# Patient Record
Sex: Female | Born: 1965 | Race: White | Hispanic: No | Marital: Married | Smoking: Never smoker
Health system: Southern US, Community
[De-identification: ages and names within clinical notes are randomized; demographics above are authoritative.]

## PROBLEM LIST (undated history)

## (undated) DIAGNOSIS — K5900 Constipation, unspecified: Secondary | ICD-10-CM

## (undated) DIAGNOSIS — M2550 Pain in unspecified joint: Secondary | ICD-10-CM

## (undated) DIAGNOSIS — M7989 Other specified soft tissue disorders: Secondary | ICD-10-CM

## (undated) DIAGNOSIS — D649 Anemia, unspecified: Secondary | ICD-10-CM

## (undated) DIAGNOSIS — M549 Dorsalgia, unspecified: Secondary | ICD-10-CM

## (undated) DIAGNOSIS — K219 Gastro-esophageal reflux disease without esophagitis: Secondary | ICD-10-CM

## (undated) DIAGNOSIS — R600 Localized edema: Secondary | ICD-10-CM

## (undated) DIAGNOSIS — E669 Obesity, unspecified: Secondary | ICD-10-CM

## (undated) DIAGNOSIS — E559 Vitamin D deficiency, unspecified: Secondary | ICD-10-CM

## (undated) DIAGNOSIS — R0602 Shortness of breath: Secondary | ICD-10-CM

## (undated) HISTORY — DX: Pain in unspecified joint: M25.50

## (undated) HISTORY — DX: Vitamin D deficiency, unspecified: E55.9

## (undated) HISTORY — DX: Obesity, unspecified: E66.9

## (undated) HISTORY — DX: Constipation, unspecified: K59.00

## (undated) HISTORY — DX: Localized edema: R60.0

## (undated) HISTORY — DX: Other specified soft tissue disorders: M79.89

## (undated) HISTORY — DX: Gastro-esophageal reflux disease without esophagitis: K21.9

## (undated) HISTORY — DX: Shortness of breath: R06.02

## (undated) HISTORY — DX: Anemia, unspecified: D64.9

## (undated) HISTORY — PX: TUBAL LIGATION: SHX77

## (undated) HISTORY — DX: Dorsalgia, unspecified: M54.9

---

## 1987-07-20 HISTORY — PX: TUBAL LIGATION: SHX77

## 1997-07-19 HISTORY — PX: REDUCTION MAMMAPLASTY: SUR839

## 1999-02-17 HISTORY — PX: BREAST SURGERY: SHX581

## 2005-04-09 ENCOUNTER — Ambulatory Visit: Payer: Self-pay | Admitting: Family Medicine

## 2007-05-26 ENCOUNTER — Ambulatory Visit: Payer: Self-pay | Admitting: Family Medicine

## 2008-01-08 ENCOUNTER — Ambulatory Visit: Payer: Self-pay | Admitting: Family Medicine

## 2009-01-21 ENCOUNTER — Ambulatory Visit: Payer: Self-pay | Admitting: Family Medicine

## 2009-05-29 ENCOUNTER — Ambulatory Visit: Payer: Self-pay | Admitting: Family Medicine

## 2009-05-29 LAB — HM MAMMOGRAPHY

## 2010-05-18 HISTORY — PX: OTHER SURGICAL HISTORY: SHX169

## 2010-06-29 ENCOUNTER — Encounter (INDEPENDENT_AMBULATORY_CARE_PROVIDER_SITE_OTHER): Payer: Self-pay | Admitting: *Deleted

## 2010-07-02 ENCOUNTER — Ambulatory Visit: Payer: Self-pay | Admitting: Otolaryngology

## 2010-08-07 ENCOUNTER — Ambulatory Visit: Admit: 2010-08-07 | Payer: Self-pay | Admitting: Gastroenterology

## 2010-08-20 NOTE — Letter (Signed)
Summary: New Patient letter  [facility]  [address] [place], [place] [address]   Phone: [phone]  Fax: [phone]       06/29/2010 MRN: [id number]  [facility] [address] [place], [place]  [address]  Dear Andrea Alvarez,  Welcome to the Gastroenterology Division at [facility]    You are scheduled to see Dr.  Christella Hartigan on 08-07-10 at 2:00p.m. on the 3rd floor at [facility], [address]  We ask that you try to arrive at our office 15 minutes prior to your appointment time to allow for check-in.  We would like you to complete the enclosed self-administered evaluation form prior to your visit and bring it with you on the day of your appointment.  We will review it with you.  Also, please bring a complete list of all your medications or, if you prefer, bring the medication bottles and we will list them.  Please bring your insurance card so that we may make a copy of it.  If your insurance requires a referral to see a specialist, please bring your referral form from your primary care physician.  Co-payments are due at the time of your visit and may be paid by cash, check or credit card.     Your office visit will consist of a consult with your physician (includes a physical exam), any laboratory testing he/she may order, scheduling of any necessary diagnostic testing (e.g. x-ray, ultrasound, CT-scan), and scheduling of a procedure (e.g. Endoscopy, Colonoscopy) if required.  Please allow enough time on your schedule to allow for any/all of these possibilities.    If you cannot keep your appointment, please call [phone] to cancel or reschedule prior to your appointment date.  This allows [place] the opportunity to schedule an appointment for another patient in need of care.  If you do not cancel or reschedule by 5 p.m. the business day prior to your appointment date, you will be charged a $50.00 late cancellation/no-show fee.    Thank you for choosing  [facility] for your medical needs.  We appreciate the opportunity to care for you.  Please visit [place] at our website  to learn more about our practice.                     Sincerely,                                                             The Gastroenterology Division

## 2011-07-06 ENCOUNTER — Ambulatory Visit: Payer: Self-pay | Admitting: Family Medicine

## 2012-01-17 ENCOUNTER — Emergency Department: Payer: Self-pay | Admitting: Emergency Medicine

## 2012-11-10 ENCOUNTER — Ambulatory Visit: Payer: Self-pay | Admitting: Family Medicine

## 2014-01-29 ENCOUNTER — Ambulatory Visit: Payer: Self-pay | Admitting: Family Medicine

## 2014-05-01 LAB — CBC AND DIFFERENTIAL
HEMATOCRIT: 39 % (ref 36–46)
HEMOGLOBIN: 12.9 g/dL (ref 12.0–16.0)
Platelets: 404 10*3/uL — AB (ref 150–399)
WBC: 7.8 10^3/mL

## 2014-05-17 LAB — HM PAP SMEAR: HM PAP: NORMAL

## 2015-05-13 DIAGNOSIS — D649 Anemia, unspecified: Secondary | ICD-10-CM | POA: Insufficient documentation

## 2015-05-13 DIAGNOSIS — K219 Gastro-esophageal reflux disease without esophagitis: Secondary | ICD-10-CM | POA: Insufficient documentation

## 2015-05-13 DIAGNOSIS — M545 Low back pain, unspecified: Secondary | ICD-10-CM | POA: Insufficient documentation

## 2015-05-13 DIAGNOSIS — E669 Obesity, unspecified: Secondary | ICD-10-CM

## 2015-05-13 DIAGNOSIS — Z6837 Body mass index (BMI) 37.0-37.9, adult: Secondary | ICD-10-CM | POA: Insufficient documentation

## 2015-05-13 DIAGNOSIS — E66812 Obesity, class 2: Secondary | ICD-10-CM | POA: Insufficient documentation

## 2015-05-13 DIAGNOSIS — R195 Other fecal abnormalities: Secondary | ICD-10-CM | POA: Insufficient documentation

## 2015-05-13 DIAGNOSIS — G43909 Migraine, unspecified, not intractable, without status migrainosus: Secondary | ICD-10-CM | POA: Insufficient documentation

## 2015-05-13 DIAGNOSIS — R61 Generalized hyperhidrosis: Secondary | ICD-10-CM | POA: Insufficient documentation

## 2015-05-13 DIAGNOSIS — R1011 Right upper quadrant pain: Secondary | ICD-10-CM | POA: Insufficient documentation

## 2015-05-16 ENCOUNTER — Ambulatory Visit (INDEPENDENT_AMBULATORY_CARE_PROVIDER_SITE_OTHER): Payer: 59 | Admitting: Physician Assistant

## 2015-05-16 ENCOUNTER — Encounter: Payer: Self-pay | Admitting: Physician Assistant

## 2015-05-16 VITALS — BP 120/78 | HR 84 | Temp 98.6°F | Resp 16 | Ht 62.0 in | Wt 202.4 lb

## 2015-05-16 DIAGNOSIS — E669 Obesity, unspecified: Secondary | ICD-10-CM

## 2015-05-16 DIAGNOSIS — Z23 Encounter for immunization: Secondary | ICD-10-CM

## 2015-05-16 DIAGNOSIS — Z6837 Body mass index (BMI) 37.0-37.9, adult: Secondary | ICD-10-CM

## 2015-05-16 DIAGNOSIS — Z0000 Encounter for general adult medical examination without abnormal findings: Secondary | ICD-10-CM | POA: Diagnosis not present

## 2015-05-16 DIAGNOSIS — Z1239 Encounter for other screening for malignant neoplasm of breast: Secondary | ICD-10-CM | POA: Diagnosis not present

## 2015-05-16 DIAGNOSIS — Z713 Dietary counseling and surveillance: Secondary | ICD-10-CM | POA: Diagnosis not present

## 2015-05-16 DIAGNOSIS — N951 Menopausal and female climacteric states: Secondary | ICD-10-CM

## 2015-05-16 DIAGNOSIS — R232 Flushing: Secondary | ICD-10-CM

## 2015-05-16 MED ORDER — PHENTERMINE HCL 37.5 MG PO CAPS: 37.5000 mg | ORAL_CAPSULE | ORAL | Status: DC

## 2015-05-16 NOTE — Patient Instructions (Addendum)
Health Maintenance, Female Adopting a healthy lifestyle and getting preventive care can go a long way to promote health and wellness. Talk with your health care provider about what schedule of regular examinations is right for you. This is a good chance for you to check in with your provider about disease prevention and staying healthy. In between checkups, there are plenty of things you can do on your own. Experts have done a lot of research about which lifestyle changes and preventive measures are most likely to keep you healthy. Ask your health care provider for more information. WEIGHT AND DIET  Eat a healthy diet  Be sure to include plenty of vegetables, fruits, low-fat dairy products, and lean protein.  Do not eat a lot of foods high in solid fats, added sugars, or salt.  Get regular exercise. This is one of the most important things you can do for your health.  Most adults should exercise for at least 150 minutes each week. The exercise should increase your heart rate and make you sweat (moderate-intensity exercise).  Most adults should also do strengthening exercises at least twice a week. This is in addition to the moderate-intensity exercise.  Maintain a healthy weight  Body mass index (BMI) is a measurement that can be used to identify possible weight problems. It estimates body fat based on height and weight. Your health care provider can help determine your BMI and help you achieve or maintain a healthy weight.  For females 28 years of age and older:   A BMI below 18.5 is considered underweight.  A BMI of 18.5 to 24.9 is normal.  A BMI of 25 to 29.9 is considered overweight.  A BMI of 30 and above is considered obese.  Watch levels of cholesterol and blood lipids  You should start having your blood tested for lipids and cholesterol at 49 years of age, then have this test every 5 years.  You may need to have your cholesterol levels checked more often if:  Your lipid  or cholesterol levels are high.  You are older than 49 years of age.  You are at high risk for heart disease.  CANCER SCREENING   Lung Cancer  Lung cancer screening is recommended for adults 75-66 years old who are at high risk for lung cancer because of a history of smoking.  A yearly low-dose CT scan of the lungs is recommended for people who:  Currently smoke.  Have quit within the past 15 years.  Have at least a 30-pack-year history of smoking. A pack year is smoking an average of one pack of cigarettes a day for 1 year.  Yearly screening should continue until it has been 15 years since you quit.  Yearly screening should stop if you develop a health problem that would prevent you from having lung cancer treatment.  Breast Cancer  Practice breast self-awareness. This means understanding how your breasts normally appear and feel.  It also means doing regular breast self-exams. Let your health care provider know about any changes, no matter how small.  If you are in your 20s or 30s, you should have a clinical breast exam (CBE) by a health care provider every 1-3 years as part of a regular health exam.  If you are 25 or older, have a CBE every year. Also consider having a breast X-ray (mammogram) every year.  If you have a family history of breast cancer, talk to your health care provider about genetic screening.  If you  are at high risk for breast cancer, talk to your health care provider about having an MRI and a mammogram every year.  Breast cancer gene (BRCA) assessment is recommended for women who have family members with BRCA-related cancers. BRCA-related cancers include:  Breast.  Ovarian.  Tubal.  Peritoneal cancers.  Results of the assessment will determine the need for genetic counseling and BRCA1 and BRCA2 testing. Cervical Cancer Your health care provider may recommend that you be screened regularly for cancer of the pelvic organs (ovaries, uterus, and  vagina). This screening involves a pelvic examination, including checking for microscopic changes to the surface of your cervix (Pap test). You may be encouraged to have this screening done every 3 years, beginning at age 21.  For women ages 30-65, health care providers may recommend pelvic exams and Pap testing every 3 years, or they may recommend the Pap and pelvic exam, combined with testing for human papilloma virus (HPV), every 5 years. Some types of HPV increase your risk of cervical cancer. Testing for HPV may also be done on women of any age with unclear Pap test results.  Other health care providers may not recommend any screening for nonpregnant women who are considered low risk for pelvic cancer and who do not have symptoms. Ask your health care provider if a screening pelvic exam is right for you.  If you have had past treatment for cervical cancer or a condition that could lead to cancer, you need Pap tests and screening for cancer for at least 20 years after your treatment. If Pap tests have been discontinued, your risk factors (such as having a new sexual partner) need to be reassessed to determine if screening should resume. Some women have medical problems that increase the chance of getting cervical cancer. In these cases, your health care provider may recommend more frequent screening and Pap tests. Colorectal Cancer  This type of cancer can be detected and often prevented.  Routine colorectal cancer screening usually begins at 50 years of age and continues through 49 years of age.  Your health care provider may recommend screening at an earlier age if you have risk factors for colon cancer.  Your health care provider may also recommend using home test kits to check for hidden blood in the stool.  A small camera at the end of a tube can be used to examine your colon directly (sigmoidoscopy or colonoscopy). This is done to check for the earliest forms of colorectal  cancer.  Routine screening usually begins at age 50.  Direct examination of the colon should be repeated every 5-10 years through 49 years of age. However, you may need to be screened more often if early forms of precancerous polyps or small growths are found. Skin Cancer  Check your skin from head to toe regularly.  Tell your health care provider about any new moles or changes in moles, especially if there is a change in a mole's shape or color.  Also tell your health care provider if you have a mole that is larger than the size of a pencil eraser.  Always use sunscreen. Apply sunscreen liberally and repeatedly throughout the day.  Protect yourself by wearing long sleeves, pants, a wide-brimmed hat, and sunglasses whenever you are outside. HEART DISEASE, DIABETES, AND HIGH BLOOD PRESSURE   High blood pressure causes heart disease and increases the risk of stroke. High blood pressure is more likely to develop in:  People who have blood pressure in the high end   of the normal range (130-139/85-89 mm Hg).  People who are overweight or obese.  People who are African American.  If you are 38-23 years of age, have your blood pressure checked every 3-5 years. If you are 61 years of age or older, have your blood pressure checked every year. You should have your blood pressure measured twice--once when you are at a hospital or clinic, and once when you are not at a hospital or clinic. Record the average of the two measurements. To check your blood pressure when you are not at a hospital or clinic, you can use:  An automated blood pressure machine at a pharmacy.  A home blood pressure monitor.  If you are between 45 years and 39 years old, ask your health care provider if you should take aspirin to prevent strokes.  Have regular diabetes screenings. This involves taking a blood sample to check your fasting blood sugar level.  If you are at a normal weight and have a low risk for diabetes,  have this test once every three years after 49 years of age.  If you are overweight and have a high risk for diabetes, consider being tested at a younger age or more often. PREVENTING INFECTION  Hepatitis B  If you have a higher risk for hepatitis B, you should be screened for this virus. You are considered at high risk for hepatitis B if:  You were born in a country where hepatitis B is common. Ask your health care provider which countries are considered high risk.  Your parents were born in a high-risk country, and you have not been immunized against hepatitis B (hepatitis B vaccine).  You have HIV or AIDS.  You use needles to inject street drugs.  You live with someone who has hepatitis B.  You have had sex with someone who has hepatitis B.  You get hemodialysis treatment.  You take certain medicines for conditions, including cancer, organ transplantation, and autoimmune conditions. Hepatitis C  Blood testing is recommended for:  Everyone born from [birth year] through 1965.  Anyone with known risk factors for hepatitis C. Sexually transmitted infections (STIs)  You should be screened for sexually transmitted infections (STIs) including gonorrhea and chlamydia if:  You are sexually active and are younger than 49 years of age.  You are older than 49 years of age and your health care provider tells you that you are at risk for this type of infection.  Your sexual activity has changed since you were last screened and you are at an increased risk for chlamydia or gonorrhea. Ask your health care provider if you are at risk.  If you do not have HIV, but are at risk, it may be recommended that you take a prescription medicine daily to prevent HIV infection. This is called pre-exposure prophylaxis (PrEP). You are considered at risk if:  You are sexually active and do not regularly use condoms or know the HIV status of your partner(s).  You take drugs by injection.  You are sexually  active with a partner who has HIV. Talk with your health care provider about whether you are at high risk of being infected with HIV. If you choose to begin PrEP, you should first be tested for HIV. You should then be tested every 3 months for as long as you are taking PrEP.  PREGNANCY   If you are premenopausal and you may become pregnant, ask your health care provider about preconception counseling.  If you may  become pregnant, take 400 to 800 micrograms (mcg) of folic acid every day.  If you want to prevent pregnancy, talk to your health care provider about birth control (contraception). OSTEOPOROSIS AND MENOPAUSE   Osteoporosis is a disease in which the bones lose minerals and strength with aging. This can result in serious bone fractures. Your risk for osteoporosis can be identified using a bone density scan.  If you are 69 years of age or older, or if you are at risk for osteoporosis and fractures, ask your health care provider if you should be screened.  Ask your health care provider whether you should take a calcium or vitamin D supplement to lower your risk for osteoporosis.  Menopause may have certain physical symptoms and risks.  Hormone replacement therapy may reduce some of these symptoms and risks. Talk to your health care provider about whether hormone replacement therapy is right for you.  HOME CARE INSTRUCTIONS   Schedule regular health, dental, and eye exams.  Stay current with your immunizations.   Do not use any tobacco products including cigarettes, chewing tobacco, or electronic cigarettes.  If you are pregnant, do not drink alcohol.  If you are breastfeeding, limit how much and how often you drink alcohol.  Limit alcohol intake to no more than 1 drink per day for nonpregnant women. One drink equals 12 ounces of beer, 5 ounces of wine, or 1 ounces of hard liquor.  Do not use street drugs.  Do not share needles.  Ask your health care provider for help if  you need support or information about quitting drugs.  Tell your health care provider if you often feel depressed.  Tell your health care provider if you have ever been abused or do not feel safe at home.   This information is not intended to replace advice given to you by your health care provider. Make sure you discuss any questions you have with your health care provider.   Document Released: 01/18/2011 Document Revised: 07/26/2014 Document Reviewed: 06/06/2013 Elsevier Interactive Patient Education 2016 [place] Calories are energy you get from the things you eat and drink. Your body uses this energy to keep you going throughout the day. The number of calories you eat affects your weight. When you eat more calories than your body needs, your body stores the extra calories as fat. When you eat fewer calories than your body needs, your body burns fat to get the energy it needs. Calorie counting means keeping track of how many calories you eat and drink each day. If you make sure to eat fewer calories than your body needs, you should lose weight. In order for calorie counting to work, you will need to eat the number of calories that are right for you in a day to lose a healthy amount of weight per week. A healthy amount of weight to lose per week is usually 1-2 lb (0.5-0.9 kg). A dietitian can determine how many calories you need in a day and give you suggestions on how to reach your calorie goal.  WHAT IS MY MY PLAN? My goal is to have 1200 calories per day.  If I have this many calories per day, I should lose around 1-2 pounds per week. WHAT DO I NEED TO KNOW ABOUT CALORIE COUNTING? In order to meet your daily calorie goal, you will need to:  Find out how many calories are in each food you would like to eat. Try to  do this before you eat.  Decide how much of the food you can eat.  Write down what you ate and how many calories it had. Doing this is  called keeping a food log. WHERE DO I FIND CALORIE INFORMATION? The number of calories in a food can be found on a Nutrition Facts label. Note that all the information on a label is based on a specific serving of the food. If a food does not have a Nutrition Facts label, try to look up the calories online or ask your dietitian for help. HOW DO I DECIDE HOW MUCH TO EAT? To decide how much of the food you can eat, you will need to consider both the number of calories in one serving and the size of one serving. This information can be found on the Nutrition Facts label. If a food does not have a Nutrition Facts label, look up the information online or ask your dietitian for help. Remember that calories are listed per serving. If you choose to have more than one serving of a food, you will have to multiply the calories per serving by the amount of servings you plan to eat. For example, the label on a package of bread might say that a serving size is 1 slice and that there are 90 calories in a serving. If you eat 1 slice, you will have eaten 90 calories. If you eat 2 slices, you will have eaten 180 calories. HOW DO I KEEP A FOOD LOG? After each meal, record the following information in your food log:  What you ate.  How much of it you ate.  How many calories it had.  Then, add up your calories. Keep your food log near you, such as in a small notebook in your pocket. Another option is to use a mobile app or website. Some programs will calculate calories for you and show you how many calories you have left each time you add an item to the log. WHAT ARE SOME CALORIE COUNTING TIPS?  Use your calories on foods and drinks that will fill you up and not leave you hungry. Some examples of this include foods like nuts and nut butters, vegetables, lean proteins, and high-fiber foods (more than 5 g fiber per serving).  Eat nutritious foods and avoid empty calories. Empty calories are calories you get from foods  or beverages that do not have many nutrients, such as candy and soda. It is better to have a nutritious high-calorie food (such as an avocado) than a food with few nutrients (such as a bag of chips).  Know how many calories are in the foods you eat most often. This way, you do not have to look up how many calories they have each time you eat them.  Look out for foods that may seem like low-calorie foods but are really high-calorie foods, such as baked goods, soda, and fat-free candy.  Pay attention to calories in drinks. Drinks such as sodas, specialty coffee drinks, alcohol, and juices have a lot of calories yet do not fill you up. Choose low-calorie drinks like water and diet drinks.  Focus your calorie counting efforts on higher calorie items. Logging the calories in a garden salad that contains only vegetables is less important than calculating the calories in a milk shake.  Find a way of tracking calories that works for you. Get creative. Most people who are successful find ways to keep track of how much they eat in a day,  even if they do not count every calorie. WHAT ARE SOME PORTION CONTROL TIPS?  Know how many calories are in a serving. This will help you know how many servings of a certain food you can have.  Use a measuring cup to measure serving sizes. This is helpful when you start out. With time, you will be able to estimate serving sizes for some foods.  Take some time to put servings of different foods on your favorite plates, bowls, and cups so you know what a serving looks like.  Try not to eat straight from a bag or box. Doing this can lead to overeating. Put the amount you would like to eat in a cup or on a plate to make sure you are eating the right portion.  Use smaller plates, glasses, and bowls to prevent overeating. This is a quick and easy way to practice portion control. If your plate is smaller, less food can fit on it.  Try not to multitask while eating, such as  watching TV or using your computer. If it is time to eat, sit down at a table and enjoy your food. Doing this will help you to start recognizing when you are full. It will also make you more aware of what and how much you are eating. HOW CAN I CALORIE COUNT WHEN EATING OUT?  Ask for smaller portion sizes or child-sized portions.  Consider sharing an entree and sides instead of getting your own entree.  If you get your own entree, eat only half. Ask for a box at the beginning of your meal and put the rest of your entree in it so you are not tempted to eat it.  Look for the calories on the menu. If calories are listed, choose the lower calorie options.  Choose dishes that include vegetables, fruits, whole grains, low-fat dairy products, and lean protein. Focusing on smart food choices from each of the 5 food groups can help you stay on track at restaurants.  Choose items that are boiled, broiled, grilled, or steamed.  Choose water, milk, unsweetened iced tea, or other drinks without added sugars. If you want an alcoholic beverage, choose a lower calorie option. For example, a regular margarita can have up to 700 calories and a glass of wine has around 150.  Stay away from items that are buttered, battered, fried, or served with cream sauce. Items labeled "crispy" are usually fried, unless stated otherwise.  Ask for dressings, sauces, and syrups on the side. These are usually very high in calories, so do not eat much of them.  Watch out for salads. Many people think salads are a healthy option, but this is often not the case. Many salads come with bacon, fried chicken, lots of cheese, fried chips, and dressing. All of these items have a lot of calories. If you want a salad, choose a garden salad and ask for grilled meats or steak. Ask for the dressing on the side, or ask for olive oil and vinegar or lemon to use as dressing.  Estimate how many servings of a food you are given. For example, a  serving of cooked rice is  cup or about the size of half a tennis ball or one cupcake wrapper. Knowing serving sizes will help you be aware of how much food you are eating at restaurants. The list below tells you how big or small some common portion sizes are based on everyday objects.  1 oz--4 stacked dice.  3 oz--1  deck of cards.  1 tsp--1 dice.  1 Tbsp-- a Ping-Pong ball.  2 Tbsp--1 Ping-Pong ball.   cup--1 tennis ball or 1 cupcake wrapper.  1 cup--1 baseball.   This information is not intended to replace advice given to you by your health care provider. Make sure you discuss any questions you have with your health care provider.   Document Released: 07/05/2005 Document Revised: 07/26/2014 Document Reviewed: 05/10/2013 Elsevier Interactive Patient Education 2016 [employer].   Exercising to [employer] Exercising can help you to lose weight. In order to lose weight through exercise, you need to do vigorous-intensity exercise. You can tell that you are exercising with vigorous intensity if you are breathing very hard and fast and cannot hold a conversation while exercising. Moderate-intensity exercise helps to maintain your current weight. You can tell that you are exercising at a moderate level if you have a higher heart rate and faster breathing, but you are still able to hold a conversation. HOW OFTEN SHOULD I EXERCISE? Choose an activity that you enjoy and set realistic goals. Your health care provider can help you to make an activity plan that works for you. Exercise regularly as directed by your health care provider. This may include:  Doing resistance training twice each week, such as:  Push-ups.  Sit-ups.  Lifting weights.  Using resistance bands.  Doing a given intensity of exercise for a given amount of time. Choose from these options:  150 minutes of moderate-intensity exercise every week.  75 minutes of vigorous-intensity exercise every week.  A mix of  moderate-intensity and vigorous-intensity exercise every week. Children, pregnant women, people who are out of shape, people who are overweight, and older adults may need to consult a health care provider for individual recommendations. If you have any sort of medical condition, be sure to consult your health care provider before starting a new exercise program. WHAT ARE SOME ACTIVITIES THAT CAN HELP ME TO LOSE WEIGHT?   Walking at a rate of at least 4.5 miles an hour.  Jogging or running at a rate of 5 miles per hour.  Biking at a rate of at least 10 miles per hour.  Lap swimming.  Roller-skating or in-line skating.  Cross-country skiing.  Vigorous competitive sports, such as football, basketball, and soccer.  Jumping rope.  Aerobic dancing. HOW CAN I BE MORE ACTIVE IN MY DAY-TO-DAY ACTIVITIES?  Use the stairs instead of the elevator.  Take a walk during your lunch break.  If you drive, park your car farther away from work or school.  If you take public transportation, get off one stop early and walk the rest of the way.  Make all of your phone calls while standing up and walking around.  Get up, stretch, and walk around every 30 minutes throughout the day. WHAT GUIDELINES SHOULD I FOLLOW WHILE EXERCISING?  Do not exercise so much that you hurt yourself, feel dizzy, or get very short of breath.  Consult your health care provider prior to starting a new exercise program.  Wear comfortable clothes and shoes with good support.  Drink plenty of water while you exercise to prevent dehydration or heat stroke. Body water is lost during exercise and must be replaced.  Work out until you breathe faster and your heart beats faster.   This information is not intended to replace advice given to you by your health care provider. Make sure you discuss any questions you have with your health care provider.   Document  Released: 08/07/2010 Document Revised: 07/26/2014 Document  Reviewed: 12/06/2013 Elsevier Interactive Patient Education 2016 [employer].   Phentermine tablets or capsules What is this medicine? PHENTERMINE (FEN ter meen) decreases your appetite. It is used with a reduced calorie diet and exercise to help you lose weight. This medicine may be used for other purposes; ask your health care provider or pharmacist if you have questions. What should I tell my health care provider before I take this medicine? They need to know if you have any of these conditions: -agitation -glaucoma -heart disease -high blood pressure -history of substance abuse -lung disease called Primary Pulmonary Hypertension (PPH) -taken an MAOI like Carbex, Eldepryl, Marplan, Nardil, or Parnate in last 14 days -thyroid disease -an unusual or allergic reaction to phentermine, other medicines, foods, dyes, or preservatives -pregnant or trying to get pregnant -breast-feeding How should I use this medicine? Take this medicine by mouth with a glass of water. Follow the directions on the prescription label. This medicine is usually taken 30 minutes before or 1 to 2 hours after breakfast. Avoid taking this medicine in the evening. It may interfere with sleep. Take your doses at regular intervals. Do not take your medicine more often than directed. Talk to your pediatrician regarding the use of this medicine in children. Special care may be needed. Overdosage: If you think you have taken too much of this medicine contact a poison control center or emergency room at once. NOTE: This medicine is only for you. Do not share this medicine with others. What if I miss a dose? If you miss a dose, take it as soon as you can. If it is almost time for your next dose, take only that dose. Do not take double or extra doses. What may interact with this medicine? Do not take this medicine with any of the following medications: -duloxetine -MAOIs like Carbex, Eldepryl, Marplan, Nardil, and  Parnate -medicines for colds or breathing difficulties like pseudoephedrine or phenylephrine -procarbazine -sibutramine -SSRIs like citalopram, escitalopram, fluoxetine, fluvoxamine, paroxetine, and sertraline -stimulants like dexmethylphenidate, methylphenidate or modafinil -venlafaxine This medicine may also interact with the following medications: -medicines for diabetes This list may not describe all possible interactions. Give your health care provider a list of all the medicines, herbs, non-prescription drugs, or dietary supplements you use. Also tell them if you smoke, drink alcohol, or use illegal drugs. Some items may interact with your medicine. What should I watch for while using this medicine? Notify your physician immediately if you become short of breath while doing your normal activities. Do not take this medicine within 6 hours of bedtime. It can keep you from getting to sleep. Avoid drinks that contain caffeine and try to stick to a regular bedtime every night. This medicine was intended to be used in addition to a healthy diet and exercise. The best results are achieved this way. This medicine is only indicated for short-term use. Eventually your weight loss may level out. At that point, the drug will only help you maintain your new weight. Do not increase or in any way change your dose without consulting your doctor. You may get drowsy or dizzy. Do not drive, use machinery, or do anything that needs mental alertness until you know how this medicine affects you. Do not stand or sit up quickly, especially if you are an older patient. This reduces the risk of dizzy or fainting spells. Alcohol may increase dizziness and drowsiness. Avoid alcoholic drinks. What side effects may I notice from  receiving this medicine? Side effects that you should report to your doctor or health care professional as soon as possible: -chest pain, palpitations -depression or severe changes in  mood -increased blood pressure -irritability -nervousness or restlessness -severe dizziness -shortness of breath -problems urinating -unusual swelling of the legs -vomiting Side effects that usually do not require medical attention (report to your doctor or health care professional if they continue or are bothersome): -blurred vision or other eye problems -changes in sexual ability or desire -constipation or diarrhea -difficulty sleeping -dry mouth or unpleasant taste -headache -nausea This list may not describe all possible side effects. Call your doctor for medical advice about side effects. You may report side effects to FDA at 1-800-FDA-1088. Where should I keep my medicine? Keep out of the reach of children. This medicine can be abused. Keep your medicine in a safe place to protect it from theft. Do not share this medicine with anyone. Selling or giving away this medicine is dangerous and against the law. This medicine may cause accidental overdose and death if taken by other adults, children, or pets. Mix any unused medicine with a substance like cat litter or coffee grounds. Then throw the medicine away in a sealed container like a sealed bag or a coffee can with a lid. Do not use the medicine after the expiration date. Store at room temperature between 20 and 25 degrees C (68 and 77 degrees F). Keep container tightly closed. NOTE: This sheet is a summary. It may not cover all possible information. If you have questions about this medicine, talk to your doctor, pharmacist, or health care provider.    2016, [employer]. (2014-03-26 16:19:53)

## 2015-05-16 NOTE — Progress Notes (Signed)
Patient: Andrea Alvarez, Female    DOB: [birth year], 49 y.o.   MRN: [id number] Visit Date: 05/16/2015  Today's Provider: [name], PA-C   Chief Complaint  Patient presents with  . Annual Exam   Subjective:    Annual physical exam Andrea Alvarez is a 49 y.o. female who presents today for health maintenance and complete physical. She feels well. She reports not exercising. She reports she is sleeping fairly well wakes up twice a week and is hard to go back to sleep and goes together with night sweats. She states that it is tolerable and has not affected her daily life at this time. Pap smear last year was normal. There is no personal or family history of cervical or ovarian cancer. She cannot remember the last time she had a mammogram. She states she has been told before that she did have a fibrous cyst that was worked up. Otherwise she has always had normal mammograms. There is no family history of breast cancer. She has never had a colonoscopy. There is no family history of colon cancer. She did have an episode of positive Hemoccult last year. She states she does occasionally get some bright red blood on the toilet tissue after having a hard bowel movement. She has always struggled with constipation.  Last PCP: 05/13/14 Pap: 05/13/14 Normal   Review of Systems  Constitutional: Positive for fatigue.  HENT: Positive for nosebleeds.   Eyes: Positive for photophobia.  Respiratory: Negative.   Cardiovascular: Positive for leg swelling.  Gastrointestinal: Positive for constipation and blood in stool (sometimes when constipated; occasionally).  Endocrine: Negative.   Genitourinary: Negative.   Musculoskeletal: Positive for back pain.  Skin: Negative.   Allergic/Immunologic: Negative.   Neurological: Positive for headaches.  Hematological: Negative.   Psychiatric/Behavioral: Positive for sleep disturbance.    Social History She  reports that she has never smoked.  She has never used smokeless tobacco. She reports that she drinks alcohol. She reports that she does not use illicit drugs. Social History   Social History  . Marital Status: Married    Spouse Name: N/A  . Number of Children: N/A  . Years of Education: N/A   Social History Main Topics  . Smoking status: Never Smoker   . Smokeless tobacco: Never Used  . Alcohol Use: Yes     Comment: Occasional alcohol use; once or twice a year  . Drug Use: No  . Sexual Activity: Not Asked   Other Topics Concern  . None   Social History Narrative    Patient Active Problem List   Diagnosis Date Noted  . Abdominal pain, right upper quadrant 05/13/2015  . 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 Acid reflux 05/13/2015  . LBP (low back pain) 05/13/2015  . Headache, migraine 05/13/2015  . Night sweat 05/13/2015  . Obesity 05/13/2015  . Fecal occult blood test positive 05/13/2015    Past Surgical History  Procedure Laterality Date  . 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 Breast surgery  02/1999    status post bilateral breast reduction  . Tubal ligation      Family History  Family Status  Relation Status Death Age  . Mother Alive     Melanoma  . Father Alive     Has had a stent  . Sister Alive   . Maternal Grandmother Deceased 43    died from an MI  . Paternal Grandmother Deceased  Her family history includes Asthma in her paternal grandmother and sister; Coronary artery disease in her maternal grandmother and paternal grandmother; Heart attack in her paternal grandmother; Heart disease in her father and sister; Hypertension in her father.    Allergies not on file  Previous Medications   No medications on file    Patient Care Team: Lorie PhenixNancy Maloney, MD as PCP - General (Family Medicine)     Objective:   Vitals: There were no vitals taken for this visit.   Physical Exam  Constitutional: She is oriented to person, place, and time. She appears  well-developed and well-nourished. No distress.  HENT:  Head: Normocephalic and atraumatic.  Right Ear: Hearing, tympanic membrane, external ear and ear canal normal.  Left Ear: Hearing, tympanic membrane, external ear and ear canal normal.  Nose: Nose normal.  Mouth/Throat: Uvula is midline, oropharynx is clear and moist and mucous membranes are normal. No oropharyngeal exudate.  Eyes: Conjunctivae and EOM are normal. Pupils are equal, round, and reactive to light. Right eye exhibits no discharge. Left eye exhibits no discharge. No scleral icterus.  Neck: Normal range of motion. Neck supple. No JVD present. Carotid bruit is not present. No tracheal deviation present. No thyromegaly present.  Cardiovascular: Normal rate, regular rhythm, normal heart sounds and intact distal pulses.  Exam reveals no gallop and no friction rub.   No murmur heard. Pulmonary/Chest: Effort normal and breath sounds normal. No respiratory distress. She has no wheezes. She has no rales. She exhibits no tenderness. Right breast exhibits no inverted nipple, no mass, no nipple discharge, no skin change and no tenderness. Left breast exhibits no inverted nipple, no mass, no nipple discharge, no skin change and no tenderness. Breasts are symmetrical.  Abdominal: Soft. Bowel sounds are normal. She exhibits no distension and no mass. There is no tenderness. There is no rebound and no guarding. Hernia confirmed negative in the right inguinal area and confirmed negative in the left inguinal area.  Genitourinary: Rectum normal, vagina normal and uterus normal. No breast swelling, tenderness, discharge or bleeding. Pelvic exam was performed with patient supine. There is no rash, tenderness, lesion or injury on the right labia. There is no rash, tenderness, lesion or injury on the left labia. Right adnexum displays no mass, no tenderness and no fullness. Left adnexum displays no mass, no tenderness and no fullness. No erythema, tenderness  or bleeding in the vagina. No signs of injury around the vagina. No vaginal discharge found.  Musculoskeletal: Normal range of motion. She exhibits no edema or tenderness.  Lymphadenopathy:    She has no cervical adenopathy.       Right: No inguinal adenopathy present.       Left: No inguinal adenopathy present.  Neurological: She is alert and oriented to person, place, and time. She has normal reflexes. No cranial nerve deficit. Coordination normal.  Skin: Skin is warm and dry. No rash noted. She is not diaphoretic.  Psychiatric: She has a normal mood and affect. Her behavior is normal. Judgment and thought content normal.  Vitals reviewed.    Depression Screen No flowsheet data found.    Assessment & Plan:     Routine Health Maintenance and Physical Exam  1. Annual physical exam Physical exam was normal today. She has had labs done through biometric screening at her work. She states her labs were normal. The only thing that was abnormal was her BMI on the screening. I will follow-up with her in one year for repeat  physical exam.  2. Breast cancer screening Breast exam was normal today. Mammogram was ordered and phone number to [facility] was given to patient. This is so she may be able to call to schedule her appointment at her discretion. - Mammogram Digital Screening; Future  3. Need for influenza vaccination Flu vaccine was given today without complication. - Flu Vaccine QUAD 36+ mos IM (Fluarix)  4. Hot flashes She states that the hot flashes have been going on for proximally one year now. She does still have her menstrual cycle. She states it is regular. She has not had any hormone levels checked for this. She would not like to do this at this time. She does feel this is most likely menopausal in nature. We did discuss different treatments for menopausal hot flashes such as fluoxetine or venlafaxine. We also discussed reasons why I would not do hormonal  replacement therapy at this time including: Increased risk of ovarian or breast cancer and increased risk of blood clots. She does not wish to go on venlafaxine or fluoxetine at this time due to risk of weight gain. We will continue to monitor.  5. BMI 37.0-37.9, adult See medical treatment plan for encounter for weight loss. - phentermine 37.5 MG capsule; Take 1 capsule (37.5 mg total) by mouth every morning.  Dispense: 30 capsule; Refill: 0  6. Obese See medical treatment plan for encounter for weight loss. - phentermine 37.5 MG capsule; Take 1 capsule (37.5 mg total) by mouth every morning.  Dispense: 30 capsule; Refill: 0  7. Encounter for weight loss counseling We did discuss in detail weight loss options. I did discuss the importance of a food diary. We discussed different options for food diaries. She states that she has used by fitness [employer] and will try that one again. I also gave her information on a 1200-calorie diet for her to try to follow. She voiced understanding. I've also encouraged her to increase her physical activity. I have advised her that a day recommendations are 450 minutes of moderate exercise per week. By this up as needed per her schedule. We will also try phentermine as below. I will follow up with her in 4 weeks to see how she is doing and for a weight recheck. - phentermine 37.5 MG capsule; Take 1 capsule (37.5 mg total) by mouth every morning.  Dispense: 30 capsule; Refill: 0   Exercise Activities and Dietary recommendations Goals    None      Immunization History  Administered Date(s) Administered  . Tdap 05/13/2014    Health Maintenance  Topic Date Due  . HIV Screening  10/12/1980  . INFLUENZA VACCINE  02/17/2015  . PAP SMEAR  05/17/2017  . TETANUS/TDAP  05/13/2024      Discussed health benefits of physical activity, and encouraged her to engage in regular exercise appropriate for her age and condition.   I spent approximately 45 minutes with  the patient today. Over 50% of this time was spent with counseling and educating the patient on diet and exercise.    --------------------------------------------------------------------

## 2015-06-16 ENCOUNTER — Encounter: Payer: Self-pay | Admitting: Physician Assistant

## 2015-06-16 ENCOUNTER — Ambulatory Visit (INDEPENDENT_AMBULATORY_CARE_PROVIDER_SITE_OTHER): Payer: 59 | Admitting: Physician Assistant

## 2015-06-16 VITALS — BP 100/70 | HR 91 | Temp 98.1°F | Resp 16 | Wt 195.6 lb

## 2015-06-16 DIAGNOSIS — K5900 Constipation, unspecified: Secondary | ICD-10-CM | POA: Diagnosis not present

## 2015-06-16 DIAGNOSIS — E669 Obesity, unspecified: Secondary | ICD-10-CM | POA: Diagnosis not present

## 2015-06-16 DIAGNOSIS — Z6835 Body mass index (BMI) 35.0-35.9, adult: Secondary | ICD-10-CM | POA: Diagnosis not present

## 2015-06-16 DIAGNOSIS — Z713 Dietary counseling and surveillance: Secondary | ICD-10-CM

## 2015-06-16 MED ORDER — PHENTERMINE HCL 37.5 MG PO CAPS: 37.5000 mg | ORAL_CAPSULE | ORAL | Status: DC

## 2015-06-16 NOTE — Patient Instructions (Signed)
Exercising to Lose Weight Exercising can help you to lose weight. In order to lose weight through exercise, you need to do vigorous-intensity exercise. You can tell that you are exercising with vigorous intensity if you are breathing very hard and fast and cannot hold a conversation while exercising. Moderate-intensity exercise helps to maintain your current weight. You can tell that you are exercising at a moderate level if you have a higher heart rate and faster breathing, but you are still able to hold a conversation. HOW OFTEN SHOULD I EXERCISE? Choose an activity that you enjoy and set realistic goals. Your health care provider can help you to make an activity plan that works for you. Exercise regularly as directed by your health care provider. This may include:  Doing resistance training twice each week, such as:  Push-ups.  Sit-ups.  Lifting weights.  Using resistance bands.  Doing a given intensity of exercise for a given amount of time. Choose from these options:  150 minutes of moderate-intensity exercise every week.  75 minutes of vigorous-intensity exercise every week.  A mix of moderate-intensity and vigorous-intensity exercise every week. Children, pregnant women, people who are out of shape, people who are overweight, and older adults may need to consult a health care provider for individual recommendations. If you have any sort of medical condition, be sure to consult your health care provider before starting a new exercise program. WHAT ARE SOME ACTIVITIES THAT CAN HELP ME TO LOSE WEIGHT?   Walking at a rate of at least 4.5 miles an hour.  Jogging or running at a rate of 5 miles per hour.  Biking at a rate of at least 10 miles per hour.  Lap swimming.  Roller-skating or in-line skating.  Cross-country skiing.  Vigorous competitive sports, such as football, basketball, and soccer.  Jumping rope.  Aerobic dancing. HOW CAN I BE MORE ACTIVE IN MY DAY-TO-DAY  ACTIVITIES?  Use the stairs instead of the elevator.  Take a walk during your lunch break.  If you drive, park your car farther away from work or school.  If you take public transportation, get off one stop early and walk the rest of the way.  Make all of your phone calls while standing up and walking around.  Get up, stretch, and walk around every 30 minutes throughout the day. WHAT GUIDELINES SHOULD I FOLLOW WHILE EXERCISING?  Do not exercise so much that you hurt yourself, feel dizzy, or get very short of breath.  Consult your health care provider prior to starting a new exercise program.  Wear comfortable clothes and shoes with good support.  Drink plenty of water while you exercise to prevent dehydration or heat stroke. Body water is lost during exercise and must be replaced.  Work out until you breathe faster and your heart beats faster.   This information is not intended to replace advice given to you by your health care provider. Make sure you discuss any questions you have with your health care provider.   Document Released: 08/07/2010 Document Revised: 07/26/2014 Document Reviewed: 12/06/2013 Elsevier Interactive Patient Education 2016 [employer].  Constipation, Adult Constipation is when a person has fewer than three bowel movements a week, has difficulty having a bowel movement, or has stools that are dry, hard, or larger than normal. As people grow older, constipation is more common. A low-fiber diet, not taking in enough fluids, and taking certain medicines may make constipation worse.  CAUSES   Certain medicines, such as antidepressants, pain  medicine, iron supplements, antacids, and water pills.   Certain diseases, such as diabetes, irritable bowel syndrome (IBS), thyroid disease, or depression.   Not drinking enough water.   Not eating enough fiber-rich foods.   Stress or travel.   Lack of physical activity or exercise.   Ignoring the urge to  have a bowel movement.   Using laxatives too much.  SIGNS AND SYMPTOMS   Having fewer than three bowel movements a week.   Straining to have a bowel movement.   Having stools that are hard, dry, or larger than normal.   Feeling full or bloated.   Pain in the lower abdomen.   Not feeling relief after having a bowel movement.  DIAGNOSIS  Your health care provider will take a medical history and perform a physical exam. Further testing may be done for severe constipation. Some tests may include:  A barium enema X-ray to examine your rectum, colon, and, sometimes, your small intestine.   A sigmoidoscopy to examine your lower colon.   A colonoscopy to examine your entire colon. TREATMENT  Treatment will depend on the severity of your constipation and what is causing it. Some dietary treatments include drinking more fluids and eating more fiber-rich foods. Lifestyle treatments may include regular exercise. If these diet and lifestyle recommendations do not help, your health care provider may recommend taking over-the-counter laxative medicines to help you have bowel movements. Prescription medicines may be prescribed if over-the-counter medicines do not work.  HOME CARE INSTRUCTIONS   Eat foods that have a lot of fiber, such as fruits, vegetables, whole grains, and beans.  Limit foods high in fat and processed sugars, such as french fries, hamburgers, cookies, candies, and soda.   A fiber supplement may be added to your diet if you cannot get enough fiber from foods.   Drink enough fluids to keep your urine clear or pale yellow.   Exercise regularly or as directed by your health care provider.   Go to the restroom when you have the urge to go. Do not hold it.   Only take over-the-counter or prescription medicines as directed by your health care provider. Do not take other medicines for constipation without talking to your health care provider first.  SEEK IMMEDIATE  MEDICAL CARE IF:   You have bright red blood in your stool.   Your constipation lasts for more than 4 days or gets worse.   You have abdominal or rectal pain.   You have thin, pencil-like stools.   You have unexplained weight loss. MAKE SURE YOU:   Understand these instructions.  Will watch your condition.  Will get help right away if you are not doing well or get worse.   This information is not intended to replace advice given to you by your health care provider. Make sure you discuss any questions you have with your health care provider.   Document Released: 04/02/2004 Document Revised: 07/26/2014 Document Reviewed: 04/16/2013 Elsevier Interactive Patient Education 2016 Elsevier Inc.  Polyethylene Glycol powder What is this medicine? POLYETHYLENE GLYCOL 3350 (pol ee ETH i leen; GLYE col) powder is a laxative used to treat constipation. It increases the amount of water in the stool. Bowel movements become easier and more frequent. This medicine may be used for other purposes; ask your health care provider or pharmacist if you have questions. What should I tell my health care provider before I take this medicine? They need to know if you have any of these conditions: -a  history of blockage of the stomach or intestine -current abdomen distension or pain -difficulty swallowing -diverticulitis, ulcerative colitis, or other chronic bowel disease -phenylketonuria -an unusual or allergic reaction to polyethylene glycol, other medicines, dyes, or preservatives -pregnant or trying to get pregnant -breast-feeding How should I use this medicine? Take this medicine by mouth. The bottle has a measuring cap that is marked with a line. Pour the powder into the cap up to the marked line (the dose is about 1 heaping tablespoon). Add the powder in the cap to a full glass (4 to 8 ounces or 120 to 240 ml) of water, juice, soda, coffee or tea. Mix the powder well. Drink the solution. Take  exactly as directed. Do not take your medicine more often than directed. Talk to your pediatrician regarding the use of this medicine in children. Special care may be needed. Overdosage: If you think you have taken too much of this medicine contact a poison control center or emergency room at once. NOTE: This medicine is only for you. Do not share this medicine with others. What if I miss a dose? If you miss a dose, take it as soon as you can. If it is almost time for your next dose, take only that dose. Do not take double or extra doses. What may interact with this medicine? Interactions are not expected. This list may not describe all possible interactions. Give your health care provider a list of all the medicines, herbs, non-prescription drugs, or dietary supplements you use. Also tell them if you smoke, drink alcohol, or use illegal drugs. Some items may interact with your medicine. What should I watch for while using this medicine? Do not use for more than 2 weeks without advice from your doctor or health care professional. It can take 2 to 4 days to have a bowel movement and to experience improvement in constipation. See your health care professional for any changes in bowel habits, including constipation, that are severe or last longer than three weeks. Always take this medicine with plenty of water. What side effects may I notice from receiving this medicine? Side effects that you should report to your doctor or health care professional as soon as possible: -diarrhea -difficulty breathing -itching of the skin, hives, or skin rash -severe bloating, pain, or distension of the stomach -vomiting Side effects that usually do not require medical attention (report to your doctor or health care professional if they continue or are bothersome): -bloating or gas -lower abdominal discomfort or cramps -nausea This list may not describe all possible side effects. Call your doctor for medical  advice about side effects. You may report side effects to FDA at 1-800-FDA-1088. Where should I keep my medicine? Keep out of the reach of children. Store between 15 and 30 degrees C (59 and 86 degrees F). Throw away any unused medicine after the expiration date. NOTE: This sheet is a summary. It may not cover all possible information. If you have questions about this medicine, talk to your doctor, pharmacist, or health care provider.    2016, Elsevier/Gold Standard. (2008-02-05 16:50:45)

## 2015-06-16 NOTE — Progress Notes (Signed)
Patient: Andrea Alvarez Female    DOB: 1966-06-20   49 y.o.   MRN: [id number] Visit Date: 06/16/2015  Today's Provider: [name], PA-C   Chief Complaint  Patient presents with  . Follow-up    weight   Subjective:    HPI  1. Obesity: Patient is here today to follow-up on her weight. Patient in last office visit was put on Phentermine to help with weight goals and was encouraged to keep a food diary and to exercise. Weight on last office visit was 202.4 and now is 195.6 lbs. Patient is walking more at lunch time and after work. Instead of taking the elevator patient is taking the stairs more. Overall patient states is eating more healthy. Smaller portions of meals. Patient has 2 meals a day. No snacks. Patient is drinking more water. Patient has noticed that if she drinks caffeinated drinks makes her anxious.     No Known Allergies Previous Medications   PHENTERMINE 37.5 MG CAPSULE    Take 1 capsule (37.5 mg total) by mouth every morning.    Review of Systems  Constitutional: Positive for activity change and appetite change. Negative for fever, chills and fatigue.  HENT: Negative.   Respiratory: Negative.   Cardiovascular: Negative.   Gastrointestinal: Positive for constipation. Negative for nausea, vomiting, abdominal pain, diarrhea, blood in stool, abdominal distention and rectal pain.  Genitourinary: Negative.   Neurological: Negative.   Psychiatric/Behavioral: Negative.     Social History  Substance Use Topics  . Smoking status: Never Smoker   . Smokeless tobacco: Never Used  . Alcohol Use: Yes     Comment: Occasional alcohol use; once or twice a year   Objective:   BP 100/70 mmHg  Pulse 91  Temp(Src) 98.1 F (36.7 C) (Oral)  Resp 16  Wt 195 lb 9.6 oz (88.724 kg)  LMP 05/17/2015  Physical Exam  Constitutional: She appears well-developed and well-nourished. No distress.  Cardiovascular: Normal rate, regular rhythm and normal heart sounds.   Exam reveals no gallop and no friction rub.   No murmur heard. Pulmonary/Chest: Effort normal and breath sounds normal. No respiratory distress. She has no wheezes. She has no rales.  Abdominal: Soft. Bowel sounds are normal. She exhibits no distension and no mass. There is no tenderness. There is no rebound and no guarding.  Skin: She is not diaphoretic.  Vitals reviewed.       Assessment & Plan:     1. Obesity She has done very well with beginning her weight loss journey. She is counting calories and keeping a food diary. She does state adding extra opportunities for her physical activity including taking stairs and parking further away from more so that she has to walk further. She has not started a formal exercise routine at this time however. I encouraged her to try to increase physical activity and to make this a part of her daily routine at this time. I will refill her phentermine as below to continue to help with appetite suppression. I will see her back in 4 weeks to evaluate how she is doing and to recheck her weight at that time. She is to call the office if she has any questions or concerns in the meantime. - phentermine 37.5 MG capsule; Take 1 capsule (37.5 mg total) by mouth every morning.  Dispense: 30 capsule; Refill: 0  2. Encounter for weight loss counseling See above medical treatment plan. - phentermine 37.5 MG capsule;  Take 1 capsule (37.5 mg total) by mouth every morning.  Dispense: 30 capsule; Refill: 0  3. BMI 35.0-35.9,adult See above medical treatment plan. - phentermine 37.5 MG capsule; Take 1 capsule (37.5 mg total) by mouth every morning.  Dispense: 30 capsule; Refill: 0  4. Constipation, unspecified constipation type She recently restarted taking her iron supplement due to increased restless leg symptoms at night. She states her sister has restless leg symptoms when her iron drops and iron helps this. She states that since starting the iron and this has helped  her restless leg symptoms. However she states the iron has increased her constipation. She currently takes fiber once daily. I advised her that she could increase her fiber tablets 2 twice daily and may also add MiraLAX as needed. She voiced understanding. I will follow-up with her in 4 weeks to see how she is doing when she comes back for her weight recheck. She may call the office if she has any worsening symptoms, questions or concerns in the meantime.       Margaretann LovelessJennifer M Jorrell Kuster, PA-C  [facility]

## 2015-07-15 ENCOUNTER — Ambulatory Visit (INDEPENDENT_AMBULATORY_CARE_PROVIDER_SITE_OTHER): Payer: 59 | Admitting: Physician Assistant

## 2015-07-15 ENCOUNTER — Encounter: Payer: Self-pay | Admitting: Physician Assistant

## 2015-07-15 VITALS — BP 120/78 | HR 66 | Temp 97.9°F | Resp 16 | Wt 188.6 lb

## 2015-07-15 DIAGNOSIS — E669 Obesity, unspecified: Secondary | ICD-10-CM | POA: Diagnosis not present

## 2015-07-15 DIAGNOSIS — Z713 Dietary counseling and surveillance: Secondary | ICD-10-CM | POA: Diagnosis not present

## 2015-07-15 DIAGNOSIS — Z6834 Body mass index (BMI) 34.0-34.9, adult: Secondary | ICD-10-CM | POA: Diagnosis not present

## 2015-07-15 MED ORDER — PHENTERMINE HCL 37.5 MG PO CAPS: 37.5000 mg | ORAL_CAPSULE | ORAL | Status: DC

## 2015-07-15 NOTE — Progress Notes (Signed)
Patient: Andrea RusselSharon M Alvarez Female    DOB: [birth year]   49 y.o.   MRN: [id number] Visit Date: 07/15/2015  Today's Provider: Margaretann LovelessJennifer M Burnette, PA-C   Chief Complaint  Patient presents with  . Follow-up    Weight   Subjective:    HPI Obesity: Patient here for her 4 week follow-up.PAtient is currently on Phentermine to help with the appetite. Patient cites increased physical ability, health, and self image as reasons for wanting to lose weight. On last office visit her weight was 195.8 lbs and today is 188.6 lbs.She is counting calories and keeping a food diary. She does state adding extra opportunities for her physical activity including taking stairs and parking further away from more so that she has to walk further. She has not started a formal exercise routine at this time but has been working two jobs. Patient states now is back to one so hopefully will add a formal exercise routine.    No Known Allergies Previous Medications   PHENTERMINE 37.5 MG CAPSULE    Take 1 capsule (37.5 mg total) by mouth every morning.    Review of Systems  Constitutional: Negative.   Respiratory: Negative.   Cardiovascular: Negative.   Gastrointestinal: Negative.   Neurological: Positive for headaches.  All other systems reviewed and are negative.   Social History  Substance Use Topics  . Smoking status: Never Smoker   . Smokeless tobacco: Never Used  . Alcohol Use: Yes     Comment: Occasional alcohol use; once or twice a year   Objective:   BP 120/78 mmHg  Pulse 66  Temp(Src) 97.9 F (36.6 C) (Oral)  Resp 16  Wt 188 lb 9.6 oz (85.548 kg)  LMP 07/11/2015  Physical Exam  Constitutional: She appears well-developed and well-nourished. No distress.  Neck: Normal range of motion. Neck supple. No tracheal deviation present. No thyromegaly present.  Cardiovascular: Normal rate, regular rhythm and normal heart sounds.  Exam reveals no gallop and no friction rub.   No murmur  heard. Pulmonary/Chest: Effort normal and breath sounds normal. No respiratory distress. She has no wheezes. She has no rales.  Lymphadenopathy:    She has no cervical adenopathy.  Skin: She is not diaphoretic.  Vitals reviewed.       Assessment & Plan:     1. Encounter for weight loss counseling She's been doing very well following a 1200-calorie diet. She has been keeping a food diary and is trying to make small changes daily to add physical activity. She is hopeful at the beginning of the year to start formal exercise routine. She states that her and a friend are planning on getting a gym membership together so that they can hold each other accountable. She is to continue her 1200-calorie diet as directed and continue her food diary. I did encourage her to try to continue to add more opportunities for increased physical activity daily. I did also encourage her to try to exercise at least 30-40 minutes 3-4 days a week. I will refill her phentermine as below. She is to call the office if she has any acute issues, questions or concerns. I will see her back in 4 weeks to reevaluate how she is doing and to check her weight loss progress. - phentermine 37.5 MG capsule; Take 1 capsule (37.5 mg total) by mouth every morning.  Dispense: 30 capsule; Refill: 0  2. Obesity See above medical treatment plan. - phentermine 37.5 MG capsule; Take  1 capsule (37.5 mg total) by mouth every morning.  Dispense: 30 capsule; Refill: 0  3. BMI 34.0-34.9,adult See above medical treatment plan.       [name], PA-C  [facility]

## 2015-07-15 NOTE — Patient Instructions (Signed)

## 2015-08-13 ENCOUNTER — Ambulatory Visit (INDEPENDENT_AMBULATORY_CARE_PROVIDER_SITE_OTHER): Payer: 59 | Admitting: Physician Assistant

## 2015-08-13 ENCOUNTER — Encounter: Payer: Self-pay | Admitting: Physician Assistant

## 2015-08-13 VITALS — BP 110/70 | HR 91 | Temp 98.6°F | Resp 16 | Wt 188.6 lb

## 2015-08-13 DIAGNOSIS — Z713 Dietary counseling and surveillance: Secondary | ICD-10-CM | POA: Diagnosis not present

## 2015-08-13 DIAGNOSIS — Z6834 Body mass index (BMI) 34.0-34.9, adult: Secondary | ICD-10-CM

## 2015-08-13 DIAGNOSIS — E669 Obesity, unspecified: Secondary | ICD-10-CM | POA: Diagnosis not present

## 2015-08-13 MED ORDER — PHENTERMINE HCL 37.5 MG PO CAPS
37.5000 mg | ORAL_CAPSULE | ORAL | Status: DC
Start: 2015-08-13 — End: 2015-09-25

## 2015-08-13 NOTE — Patient Instructions (Signed)

## 2015-08-13 NOTE — Progress Notes (Signed)
Patient: Andrea Alvarez Female    DOB: 1965-11-23   50 y.o.   MRN: [id number] Visit Date: 08/13/2015  Today's Provider: [name], PA-C   Chief Complaint  Patient presents with  . Follow-up    Weight loss   Subjective:    HPI Andrea Alvarez is here for her 4 week follow-up weight. She is on Phentermine 37.5 mg. Last office visit weight was 188.6 lbs. Today is 188.6 lbs.She following a 1200-calorie diet. She has been keeping a food diary and is trying to make small changes daily to add physical activity. Activity is still the same. She has not been drinking water like she was. She has not been taking the phentermine on the weekend.She has not been able to go the gym with her friend. She reports they have a gym at work and she gets there on time just don't have the will.     No Known Allergies Previous Medications   PHENTERMINE 37.5 MG CAPSULE    Take 1 capsule (37.5 mg total) by mouth every morning.    Review of Systems  Constitutional: Negative.   HENT: Negative.   Eyes: Negative.   Respiratory: Negative.   Cardiovascular: Negative.   Gastrointestinal: Negative.   Endocrine: Negative.   Genitourinary: Negative.   Musculoskeletal: Negative.   Skin: Negative.   Allergic/Immunologic: Negative.   Neurological: Negative.   Hematological: Negative.   Psychiatric/Behavioral: Negative.     Social History  Substance Use Topics  . Smoking status: Never Smoker   . Smokeless tobacco: Never Used  . Alcohol Use: Yes     Comment: Occasional alcohol use; once or twice a year   Objective:   BP 110/70 mmHg  Pulse 91  Temp(Src) 98.6 F (37 C) (Oral)  Resp 16  Wt 188 lb 9.6 oz (85.548 kg)  LMP 07/11/2015  Physical Exam  Constitutional: She appears well-developed and well-nourished. No distress.  Neck: Normal range of motion. Neck supple. No tracheal deviation present. No thyromegaly present.  Cardiovascular: Normal rate, regular rhythm and normal heart sounds.  Exam  reveals no gallop and no friction rub.   No murmur heard. Pulmonary/Chest: Effort normal and breath sounds normal. No respiratory distress. She has no wheezes. She has no rales.  Lymphadenopathy:    She has no cervical adenopathy.  Skin: She is not diaphoretic.  Vitals reviewed.       Assessment & Plan:     1. Obesity I did discuss with her the importance of adding physical activity along with her diet. I encouraged her to continue to use her food diary for weight loss and calorie counting. I also advised that she doesn't have to start exercising for 3040 minutes she could start at a slower pace since work her way up. I also discussed with her that if she does not lose weight again that unfortunately I will not be able to prescribe her the phentermine again at this time. She will have to really focus on the lifestyle changes and may possibly readdress it in the future if she continues to lose weight on her own. She voiced understanding. Phentermine was refilled as below. She is to call the office if she has any worsening symptoms or adverse reactions to the medication. I will see her back in 4 weeks for her weight recheck and to evaluate how she is doing with adding exercise at this time. - phentermine 37.5 MG capsule; Take 1 capsule (37.5 mg total) by  mouth every morning.  Dispense: 30 capsule; Refill: 0  2. BMI 34.0-34.9,adult See above medical treatment plan.  3. Encounter for weight loss counseling See above medical treatment plan. - phentermine 37.5 MG capsule; Take 1 capsule (37.5 mg total) by mouth every morning.  Dispense: 30 capsule; Refill: 0       [name], PA-C  [facility]

## 2015-09-25 ENCOUNTER — Ambulatory Visit (INDEPENDENT_AMBULATORY_CARE_PROVIDER_SITE_OTHER): Payer: 59 | Admitting: Physician Assistant

## 2015-09-25 ENCOUNTER — Encounter: Payer: Self-pay | Admitting: Physician Assistant

## 2015-09-25 VITALS — BP 110/64 | HR 94 | Temp 98.4°F | Resp 16 | Wt 184.8 lb

## 2015-09-25 DIAGNOSIS — Z6833 Body mass index (BMI) 33.0-33.9, adult: Secondary | ICD-10-CM | POA: Diagnosis not present

## 2015-09-25 DIAGNOSIS — E669 Obesity, unspecified: Secondary | ICD-10-CM

## 2015-09-25 DIAGNOSIS — Z713 Dietary counseling and surveillance: Secondary | ICD-10-CM

## 2015-09-25 MED ORDER — PHENTERMINE HCL 30 MG PO CAPS: 30.0000 mg | ORAL_CAPSULE | ORAL | Status: DC

## 2015-09-25 NOTE — Patient Instructions (Signed)

## 2015-09-25 NOTE — Progress Notes (Signed)
Patient ID: Andrea RusselSharon M Drost, female   DOB: Dec 09, 1965, 50 y.o.   MRN: [id number]       Patient: Andrea RusselSharon M Rackley Female    DOB: Dec 09, 1965   50 y.o.   MRN: [id number] Visit Date: 09/25/2015  Today's Provider: Margaretann LovelessJennifer M Burnette, PA-C   No chief complaint on file.  Subjective:    HPI Pt is here for a 6 week follow up for obesity and weight loss. Her weight at last OV was 188.6lbs, this visit she is 184.8. She is keeping a food diary but not as well as she was before. She is counting calories and exercising. She is exercising 5 days a week for about 25-30 minutes rather it be the treadmill or elliptical machine. She is not having any side effects to Phentermine except for dry mouth. She does report some increased difficulty sleeping. She is not sure if this is secondary to the medication or if this is due to her difficulty sleeping already.    No Known Allergies Previous Medications   PHENTERMINE 37.5 MG CAPSULE    Take 1 capsule (37.5 mg total) by mouth every morning.    Review of Systems  Constitutional: Negative.   HENT: Negative.   Eyes: Negative.   Respiratory: Negative.   Cardiovascular: Negative.   Gastrointestinal: Negative.   Endocrine: Negative.   Genitourinary: Negative.   Musculoskeletal: Negative.   Skin: Negative.   Allergic/Immunologic: Negative.   Neurological: Negative.   Hematological: Negative.   Psychiatric/Behavioral: Negative.     Social History  Substance Use Topics  . Smoking status: Never Smoker   . Smokeless tobacco: Never Used  . Alcohol Use: Yes     Comment: Occasional alcohol use; once or twice a year   Objective:   BP 110/64 mmHg  Pulse 94  Temp(Src) 98.4 F (36.9 C) (Oral)  Resp 16  Wt 184 lb 12.8 oz (83.825 kg)  Physical Exam  Constitutional: She appears well-developed and well-nourished. No distress.  Cardiovascular: Normal rate, regular rhythm and normal heart sounds.  Exam reveals no gallop and no friction rub.   No murmur  heard. Pulmonary/Chest: Effort normal and breath sounds normal. No respiratory distress. She has no wheezes. She has no rales.  Skin: She is not diaphoretic.  Vitals reviewed.       Assessment & Plan:     1. Encounter for weight loss counseling I will decrease the dose of phentermine to 30 mg to see if this helps with her difficulty sleeping. She is to continue her food diary and exercise. She is doing well. I will see her back in 4 weeks to continue to monitor her progress. - phentermine 30 MG capsule; Take 1 capsule (30 mg total) by mouth every morning.  Dispense: 30 capsule; Refill: 0  2. Obese See above medical treatment plan. - phentermine 30 MG capsule; Take 1 capsule (30 mg total) by mouth every morning.  Dispense: 30 capsule; Refill: 0  3. BMI 33.0-33.9,adult See above medical treatment plan.       Margaretann LovelessJennifer M Burnette, PA-C  [facility]

## 2015-10-30 ENCOUNTER — Ambulatory Visit: Payer: 59 | Admitting: Physician Assistant

## 2016-02-16 ENCOUNTER — Encounter: Payer: Self-pay | Admitting: Physician Assistant

## 2016-02-16 ENCOUNTER — Ambulatory Visit (INDEPENDENT_AMBULATORY_CARE_PROVIDER_SITE_OTHER): Payer: BLUE CROSS/BLUE SHIELD | Admitting: Physician Assistant

## 2016-02-16 VITALS — BP 120/80 | HR 99 | Temp 98.4°F | Resp 16 | Wt 201.4 lb

## 2016-02-16 DIAGNOSIS — Z713 Dietary counseling and surveillance: Secondary | ICD-10-CM

## 2016-02-16 DIAGNOSIS — J0140 Acute pansinusitis, unspecified: Secondary | ICD-10-CM | POA: Diagnosis not present

## 2016-02-16 DIAGNOSIS — E669 Obesity, unspecified: Secondary | ICD-10-CM | POA: Diagnosis not present

## 2016-02-16 MED ORDER — AMOXICILLIN-POT CLAVULANATE 875-125 MG PO TABS
1.0000 | ORAL_TABLET | Freq: Two times a day (BID) | ORAL | 0 refills | Status: DC
Start: 2016-02-16 — End: 2016-03-25

## 2016-02-16 MED ORDER — PHENTERMINE HCL 30 MG PO CAPS: 30.0000 mg | ORAL_CAPSULE | ORAL | 0 refills | Status: DC

## 2016-02-16 NOTE — Patient Instructions (Signed)

## 2016-02-16 NOTE — Progress Notes (Signed)
Patient: Andrea Alvarez Female    DOB: [birth year]   50 y.o.   MRN: [id number] Visit Date: 02/16/2016  Today's Provider: [name], PA-C   Chief Complaint  Patient presents with  . Sinusitis   Subjective:    Sinusitis  This is a new problem. The current episode started in the past 7 days. The problem has been gradually worsening since onset. There has been no fever. Associated symptoms include headaches (Head Pulsating a lot) and sinus pressure. Pertinent negatives include no congestion, coughing, ear pain, shortness of breath, sneezing or sore throat. Past treatments include acetaminophen (Tylenol Severe Sinus).   She is also interested in restarting phentermine. She recently had a job change and that was why she has not followed up on her weight. She also has not been taking the phentermine either due to insurance change. She has been working from home with her new job and sitting more. She does have a gym membership and has been trying to work out for 45 minutes 3 times or more per week. She was trying to eat healthier and had been keeping a food diary but has not been doing this as frequently recently.      No Known Allergies No outpatient prescriptions have been marked as taking for the 02/16/16 encounter (Office Visit) with [name], PA-C.    Review of Systems  Constitutional: Negative.   HENT: Positive for nosebleeds, rhinorrhea (in the mornings) and sinus pressure. Negative for congestion, ear pain, sneezing and sore throat.   Respiratory: Negative for cough and shortness of breath.   Cardiovascular: Negative.   Neurological: Positive for headaches (Head Pulsating a lot).  Psychiatric/Behavioral: Negative.     Social History  Substance Use Topics  . Smoking status: Never Smoker  . Smokeless tobacco: Never Used  . Alcohol use Yes     Comment: Occasional alcohol use; once or twice a year   Objective:   BP 120/80 (BP Location: Left Arm, Patient  Position: Sitting, Cuff Size: Normal)   Pulse 99   Temp 98.4 F (36.9 C) (Oral)   Resp 16   Wt 201 lb 6.4 oz (91.4 kg)   LMP 02/07/2016   BMI 36.84 kg/m   Physical Exam  Constitutional: She appears well-developed and well-nourished. No distress.  HENT:  Head: Normocephalic and atraumatic.  Right Ear: Hearing, tympanic membrane, external ear and ear canal normal.  Left Ear: Hearing, tympanic membrane, external ear and ear canal normal.  Nose: Mucosal edema and rhinorrhea present. Right sinus exhibits maxillary sinus tenderness and frontal sinus tenderness. Left sinus exhibits maxillary sinus tenderness and frontal sinus tenderness.  Mouth/Throat: Uvula is midline and mucous membranes are normal. Posterior oropharyngeal erythema present. No oropharyngeal exudate or posterior oropharyngeal edema.  Neck: Normal range of motion. Neck supple. No tracheal deviation present. No thyromegaly present.  Cardiovascular: Normal rate, regular rhythm and normal heart sounds.  Exam reveals no gallop and no friction rub.   No murmur heard. Pulmonary/Chest: Effort normal and breath sounds normal. No stridor. No respiratory distress. She has no wheezes. She has no rales.  Lymphadenopathy:    She has no cervical adenopathy.  Skin: She is not diaphoretic.  Vitals reviewed.     Assessment & Plan:     1. Acute pansinusitis, recurrence not specified Worsening symptoms that has not responded to OTC medications.Will start augmentin as below. She may use Mucinex dm for congestion. She is to call the office if  symptoms persist or worsen. - amoxicillin-clavulanate (AUGMENTIN) 875-125 MG tablet; Take 1 tablet by mouth 2 (two) times daily.  Dispense: 20 tablet; Refill: 0  2. Encounter for weight loss counseling Will restart phentermine as below since she is stable with her new job to hopefully give appetite suppression so that she does not snack much at home. She is to continue physical activity as she has been  doing with Systems analyst. I will see her back in 4 weeks for a weight recheck. She will also schedule her CPE for 04/2016. - phentermine 30 MG capsule; Take 1 capsule (30 mg total) by mouth every morning.  Dispense: 30 capsule; Refill: 0  3. Obese See above medical treatment plan. - phentermine 30 MG capsule; Take 1 capsule (30 mg total) by mouth every morning.  Dispense: 30 capsule; Refill: 0       [name], PA-C  [facility]

## 2016-03-25 ENCOUNTER — Encounter: Payer: Self-pay | Admitting: Physician Assistant

## 2016-03-25 ENCOUNTER — Ambulatory Visit (INDEPENDENT_AMBULATORY_CARE_PROVIDER_SITE_OTHER): Payer: BLUE CROSS/BLUE SHIELD | Admitting: Physician Assistant

## 2016-03-25 VITALS — BP 112/72 | Temp 98.1°F | Ht 62.0 in | Wt 195.0 lb

## 2016-03-25 DIAGNOSIS — Z713 Dietary counseling and surveillance: Secondary | ICD-10-CM

## 2016-03-25 DIAGNOSIS — E669 Obesity, unspecified: Secondary | ICD-10-CM | POA: Diagnosis not present

## 2016-03-25 DIAGNOSIS — Z6835 Body mass index (BMI) 35.0-35.9, adult: Secondary | ICD-10-CM | POA: Diagnosis not present

## 2016-03-25 DIAGNOSIS — Z6830 Body mass index (BMI) 30.0-30.9, adult: Secondary | ICD-10-CM | POA: Insufficient documentation

## 2016-03-25 DIAGNOSIS — Z6834 Body mass index (BMI) 34.0-34.9, adult: Secondary | ICD-10-CM | POA: Insufficient documentation

## 2016-03-25 MED ORDER — PHENTERMINE HCL 37.5 MG PO TABS
37.5000 mg | ORAL_TABLET | Freq: Every day | ORAL | 0 refills | Status: DC
Start: 2016-03-25 — End: 2016-04-23

## 2016-03-25 NOTE — Progress Notes (Signed)
       Patient: Andrea RusselSharon M Cerniglia Female    DOB: 1965/11/02   50 y.o.   MRN: [id number] Visit Date: 03/25/2016  Today's Provider: Margaretann LovelessJennifer M Tyiesha Brackney, PA-C   Chief Complaint  Patient presents with  . Follow-up    weight loss counseling    Subjective:    HPI Patient comes in today for a follow up on weight loss counseling. Patient's last OV was 4 weeks ago. Per our scales, the patient has lost 6lbs since last OV.  She continues to just eat the same foods everyday. She does not keep a food diary. She is exercising, walking on the treadmill, for 45 minutes 3 days per week. She is still working at home and reports doing well with avoiding snacks through the day.  No Known Allergies   Current Outpatient Prescriptions:  .  phentermine 30 MG capsule, Take 1 capsule (30 mg total) by mouth every morning., Disp: 30 capsule, Rfl: 0  Review of Systems  Constitutional: Negative.   Respiratory: Negative.   Cardiovascular: Negative.   Gastrointestinal: Negative.   Neurological: Negative.   Psychiatric/Behavioral: Negative.     Social History  Substance Use Topics  . Smoking status: Never Smoker  . Smokeless tobacco: Never Used  . Alcohol use Yes     Comment: Occasional alcohol use; once or twice a year   Objective:   BP 112/72 (BP Location: Right Arm, Patient Position: Sitting, Cuff Size: Normal)   Temp 98.1 F (36.7 C)   Ht 5\' 2"  (1.575 m)   Wt 195 lb (88.5 kg)   BMI 35.67 kg/m   Physical Exam  Constitutional: She appears well-developed and well-nourished. No distress.  Cardiovascular: Normal rate, regular rhythm and normal heart sounds.  Exam reveals no gallop and no friction rub.   No murmur heard. Pulmonary/Chest: Effort normal and breath sounds normal. No respiratory distress. She has no wheezes. She has no rales.  Skin: She is not diaphoretic.  Psychiatric: She has a normal mood and affect. Her behavior is normal. Judgment and thought content normal.  Vitals reviewed.    Assessment & Plan:     1. Encounter for weight loss counseling She continues to do well with weight loss. Advised for her to continue exercise and try to add 5 minutes to each day to reach the [facility] recommendation of 150 min/week. I will see her back in 4 weeks for her next weight check. - phentermine (ADIPEX-P) 37.5 MG tablet; Take 1 tablet (37.5 mg total) by mouth daily before breakfast.  Dispense: 30 tablet; Refill: 0  2. Obese See above medical treatment plan. - phentermine (ADIPEX-P) 37.5 MG tablet; Take 1 tablet (37.5 mg total) by mouth daily before breakfast.  Dispense: 30 tablet; Refill: 0  3. BMI 35.0-35.9,adult See above medical treatment plan.       Margaretann LovelessJennifer M Derian Dimalanta, PA-C  [facility]

## 2016-03-25 NOTE — Patient Instructions (Signed)

## 2016-04-23 ENCOUNTER — Ambulatory Visit (INDEPENDENT_AMBULATORY_CARE_PROVIDER_SITE_OTHER): Payer: BLUE CROSS/BLUE SHIELD | Admitting: Physician Assistant

## 2016-04-23 VITALS — BP 108/72 | HR 80 | Temp 98.4°F | Resp 14 | Wt 196.0 lb

## 2016-04-23 DIAGNOSIS — Z23 Encounter for immunization: Secondary | ICD-10-CM

## 2016-04-23 DIAGNOSIS — Z6835 Body mass index (BMI) 35.0-35.9, adult: Secondary | ICD-10-CM

## 2016-04-23 DIAGNOSIS — Z713 Dietary counseling and surveillance: Secondary | ICD-10-CM

## 2016-04-23 DIAGNOSIS — E6609 Other obesity due to excess calories: Secondary | ICD-10-CM

## 2016-04-23 MED ORDER — PHENTERMINE HCL 37.5 MG PO TABS: 37.5000 mg | ORAL_TABLET | Freq: Every day | ORAL | 0 refills | Status: DC

## 2016-04-23 NOTE — Progress Notes (Signed)
Patient: Andrea RusselSharon M Perine Female    DOB: [birth year]   50 y.o.   MRN: [id number] Visit Date: 04/23/2016  Today's Provider: Margaretann LovelessJennifer M Paisly [place], PA-C   Chief Complaint  Patient presents with  . Weight Check   Subjective:    HPI  Patient is here for weight follow up. Last office visit was 03/25/16. Patient has been exercising on the treadmill 4 to 5 times a week for 45 minutes each time. She has been trying to eat healthier. She still does not do a food diary.    Wt Readings from Last 3 Encounters:  04/23/16 196 lb (88.9 kg)  03/25/16 195 lb (88.5 kg)  02/16/16 201 lb 6.4 oz (91.4 kg)      No Known Allergies   Current Outpatient Prescriptions:  .  phentermine (ADIPEX-P) 37.5 MG tablet, Take 1 tablet (37.5 mg total) by mouth daily before breakfast., Disp: 30 tablet, Rfl: 0  Review of Systems  Constitutional: Negative.   Respiratory: Negative.   Cardiovascular: Negative.   Gastrointestinal: Negative.   Musculoskeletal: Negative.   Neurological: Negative.   Psychiatric/Behavioral: Negative.     Social History  Substance Use Topics  . Smoking status: Never Smoker  . Smokeless tobacco: Never Used  . Alcohol use Yes     Comment: Occasional alcohol use; once or twice a year   Objective:   BP 108/72   Pulse 80   Temp 98.4 F (36.9 C)   Resp 14   Wt 196 lb (88.9 kg)   BMI 35.85 kg/m   Physical Exam  Constitutional: She appears well-developed and well-nourished. No distress.  Neck: Normal range of motion. Neck supple.  Cardiovascular: Normal rate, regular rhythm and normal heart sounds.  Exam reveals no gallop and no friction rub.   No murmur heard. Pulmonary/Chest: Effort normal and breath sounds normal. No respiratory distress. She has no wheezes. She has no rales.  Musculoskeletal: She exhibits no edema.  Skin: She is not diaphoretic.  Psychiatric: She has a normal mood and affect. Her behavior is normal. Judgment and thought content normal.  Vitals  reviewed.     Assessment & Plan:     1. Need for influenza vaccination Flu vaccine given today without complication. Patient sat upright for 15 minutes to check for adverse reaction before being released. - Flu Vaccine QUAD 36+ mos PF IM (Fluarix & Fluzone Quad PF)  2. Encounter for weight loss counseling Tried to emphasize the importance of a food diary, especially with her increasing her exercise since this will increase her metabolism, thus increasing her hunger. She may be overeating or absentmindedly snacking which has caused her to not lose any weight. Challenged her to try to be more aware and log the foods she is eating so that she can make sure she is eating enough to feed her body as well as not over eating. Once I know about how much she is eating I can guide her diet better. She agrees with this plan. I will give her one more month of phentermine for appetite suppression and will see her back in 4 weeks to hopefully check progress and evaluate a food log. - phentermine (ADIPEX-P) 37.5 MG tablet; Take 1 tablet (37.5 mg total) by mouth daily before breakfast.  Dispense: 30 tablet; Refill: 0  3. Class 2 obesity due to excess calories without serious comorbidity with body mass index (BMI) of 35.0 to 35.9 in adult See above medical treatment plan. - phentermine (  ADIPEX-P) 37.5 MG tablet; Take 1 tablet (37.5 mg total) by mouth daily before breakfast.  Dispense: 30 tablet; Refill: 0       [name], PA-C  [facility]

## 2016-04-23 NOTE — Patient Instructions (Signed)

## 2016-05-20 ENCOUNTER — Ambulatory Visit (INDEPENDENT_AMBULATORY_CARE_PROVIDER_SITE_OTHER): Payer: BLUE CROSS/BLUE SHIELD | Admitting: Physician Assistant

## 2016-05-20 ENCOUNTER — Encounter: Payer: Self-pay | Admitting: Physician Assistant

## 2016-05-20 ENCOUNTER — Encounter: Payer: Self-pay | Admitting: Gastroenterology

## 2016-05-20 VITALS — BP 110/70 | HR 72 | Temp 98.1°F | Resp 16 | Ht 62.0 in | Wt 195.0 lb

## 2016-05-20 DIAGNOSIS — Z0000 Encounter for general adult medical examination without abnormal findings: Secondary | ICD-10-CM

## 2016-05-20 DIAGNOSIS — Z1211 Encounter for screening for malignant neoplasm of colon: Secondary | ICD-10-CM

## 2016-05-20 DIAGNOSIS — Z1239 Encounter for other screening for malignant neoplasm of breast: Secondary | ICD-10-CM

## 2016-05-20 DIAGNOSIS — Z136 Encounter for screening for cardiovascular disorders: Secondary | ICD-10-CM

## 2016-05-20 DIAGNOSIS — Z1231 Encounter for screening mammogram for malignant neoplasm of breast: Secondary | ICD-10-CM | POA: Diagnosis not present

## 2016-05-20 DIAGNOSIS — Z833 Family history of diabetes mellitus: Secondary | ICD-10-CM

## 2016-05-20 DIAGNOSIS — Z13220 Encounter for screening for lipoid disorders: Secondary | ICD-10-CM

## 2016-05-20 LAB — POCT URINALYSIS DIPSTICK
BILIRUBIN UA: NEGATIVE
Blood, UA: NEGATIVE
Glucose, UA: NEGATIVE
KETONES UA: NEGATIVE
Leukocytes, UA: NEGATIVE
Nitrite, UA: NEGATIVE
PH UA: 6.5
PROTEIN UA: NEGATIVE
SPEC GRAV UA: 1.015
Urobilinogen, UA: 0.2

## 2016-05-20 NOTE — Patient Instructions (Signed)
Health Maintenance, Female Adopting a healthy lifestyle and getting preventive care can go a long way to promote health and wellness. Talk with your health care provider about what schedule of regular examinations is right for you. This is a good chance for you to check in with your provider about disease prevention and staying healthy. In between checkups, there are plenty of things you can do on your own. Experts have done a lot of research about which lifestyle changes and preventive measures are most likely to keep you healthy. Ask your health care provider for more information. WEIGHT AND DIET  Eat a healthy diet  Be sure to include plenty of vegetables, fruits, low-fat dairy products, and lean protein.  Do not eat a lot of foods high in solid fats, added sugars, or salt.  Get regular exercise. This is one of the most important things you can do for your health.  Most adults should exercise for at least 150 minutes each week. The exercise should increase your heart rate and make you sweat (moderate-intensity exercise).  Most adults should also do strengthening exercises at least twice a week. This is in addition to the moderate-intensity exercise.  Maintain a healthy weight  Body mass index (BMI) is a measurement that can be used to identify possible weight problems. It estimates body fat based on height and weight. Your health care provider can help determine your BMI and help you achieve or maintain a healthy weight.  For females 28 years of age and older:   A BMI below 18.5 is considered underweight.  A BMI of 18.5 to 24.9 is normal.  A BMI of 25 to 29.9 is considered overweight.  A BMI of 30 and above is considered obese.  Watch levels of cholesterol and blood lipids  You should start having your blood tested for lipids and cholesterol at 50 years of age, then have this test every 5 years.  You may need to have your cholesterol levels checked more often if:  Your lipid  or cholesterol levels are high.  You are older than 50 years of age.  You are at high risk for heart disease.  CANCER SCREENING   Lung Cancer  Lung cancer screening is recommended for adults 75-66 years old who are at high risk for lung cancer because of a history of smoking.  A yearly low-dose CT scan of the lungs is recommended for people who:  Currently smoke.  Have quit within the past 15 years.  Have at least a 30-pack-year history of smoking. A pack year is smoking an average of one pack of cigarettes a day for 1 year.  Yearly screening should continue until it has been 15 years since you quit.  Yearly screening should stop if you develop a health problem that would prevent you from having lung cancer treatment.  Breast Cancer  Practice breast self-awareness. This means understanding how your breasts normally appear and feel.  It also means doing regular breast self-exams. Let your health care provider know about any changes, no matter how small.  If you are in your 20s or 30s, you should have a clinical breast exam (CBE) by a health care provider every 1-3 years as part of a regular health exam.  If you are 25 or older, have a CBE every year. Also consider having a breast X-ray (mammogram) every year.  If you have a family history of breast cancer, talk to your health care provider about genetic screening.  If you  are at high risk for breast cancer, talk to your health care provider about having an MRI and a mammogram every year.  Breast cancer gene (BRCA) assessment is recommended for women who have family members with BRCA-related cancers. BRCA-related cancers include:  Breast.  Ovarian.  Tubal.  Peritoneal cancers.  Results of the assessment will determine the need for genetic counseling and BRCA1 and BRCA2 testing. Cervical Cancer Your health care provider may recommend that you be screened regularly for cancer of the pelvic organs (ovaries, uterus, and  vagina). This screening involves a pelvic examination, including checking for microscopic changes to the surface of your cervix (Pap test). You may be encouraged to have this screening done every 3 years, beginning at age 21.  For women ages 30-65, health care providers may recommend pelvic exams and Pap testing every 3 years, or they may recommend the Pap and pelvic exam, combined with testing for human papilloma virus (HPV), every 5 years. Some types of HPV increase your risk of cervical cancer. Testing for HPV may also be done on women of any age with unclear Pap test results.  Other health care providers may not recommend any screening for nonpregnant women who are considered low risk for pelvic cancer and who do not have symptoms. Ask your health care provider if a screening pelvic exam is right for you.  If you have had past treatment for cervical cancer or a condition that could lead to cancer, you need Pap tests and screening for cancer for at least 20 years after your treatment. If Pap tests have been discontinued, your risk factors (such as having a new sexual partner) need to be reassessed to determine if screening should resume. Some women have medical problems that increase the chance of getting cervical cancer. In these cases, your health care provider may recommend more frequent screening and Pap tests. Colorectal Cancer  This type of cancer can be detected and often prevented.  Routine colorectal cancer screening usually begins at 50 years of age and continues through 50 years of age.  Your health care provider may recommend screening at an earlier age if you have risk factors for colon cancer.  Your health care provider may also recommend using home test kits to check for hidden blood in the stool.  A small camera at the end of a tube can be used to examine your colon directly (sigmoidoscopy or colonoscopy). This is done to check for the earliest forms of colorectal  cancer.  Routine screening usually begins at age 50.  Direct examination of the colon should be repeated every 5-10 years through 50 years of age. However, you may need to be screened more often if early forms of precancerous polyps or small growths are found. Skin Cancer  Check your skin from head to toe regularly.  Tell your health care provider about any new moles or changes in moles, especially if there is a change in a mole's shape or color.  Also tell your health care provider if you have a mole that is larger than the size of a pencil eraser.  Always use sunscreen. Apply sunscreen liberally and repeatedly throughout the day.  Protect yourself by wearing long sleeves, pants, a wide-brimmed hat, and sunglasses whenever you are outside. HEART DISEASE, DIABETES, AND HIGH BLOOD PRESSURE   High blood pressure causes heart disease and increases the risk of stroke. High blood pressure is more likely to develop in:  People who have blood pressure in the high end   of the normal range (130-139/85-89 mm Hg).  People who are overweight or obese.  People who are African American.  If you are 38-23 years of age, have your blood pressure checked every 3-5 years. If you are 61 years of age or older, have your blood pressure checked every year. You should have your blood pressure measured twice--once when you are at a hospital or clinic, and once when you are not at a hospital or clinic. Record the average of the two measurements. To check your blood pressure when you are not at a hospital or clinic, you can use:  An automated blood pressure machine at a pharmacy.  A home blood pressure monitor.  If you are between 45 years and 39 years old, ask your health care provider if you should take aspirin to prevent strokes.  Have regular diabetes screenings. This involves taking a blood sample to check your fasting blood sugar level.  If you are at a normal weight and have a low risk for diabetes,  have this test once every three years after 50 years of age.  If you are overweight and have a high risk for diabetes, consider being tested at a younger age or more often. PREVENTING INFECTION  Hepatitis B  If you have a higher risk for hepatitis B, you should be screened for this virus. You are considered at high risk for hepatitis B if:  You were born in a country where hepatitis B is common. Ask your health care provider which countries are considered high risk.  Your parents were born in a high-risk country, and you have not been immunized against hepatitis B (hepatitis B vaccine).  You have HIV or AIDS.  You use needles to inject street drugs.  You live with someone who has hepatitis B.  You have had sex with someone who has hepatitis B.  You get hemodialysis treatment.  You take certain medicines for conditions, including cancer, organ transplantation, and autoimmune conditions. Hepatitis C  Blood testing is recommended for:  Everyone born from [birth year] through 1965.  Anyone with known risk factors for hepatitis C. Sexually transmitted infections (STIs)  You should be screened for sexually transmitted infections (STIs) including gonorrhea and chlamydia if:  You are sexually active and are younger than 50 years of age.  You are older than 50 years of age and your health care provider tells you that you are at risk for this type of infection.  Your sexual activity has changed since you were last screened and you are at an increased risk for chlamydia or gonorrhea. Ask your health care provider if you are at risk.  If you do not have HIV, but are at risk, it may be recommended that you take a prescription medicine daily to prevent HIV infection. This is called pre-exposure prophylaxis (PrEP). You are considered at risk if:  You are sexually active and do not regularly use condoms or know the HIV status of your partner(s).  You take drugs by injection.  You are sexually  active with a partner who has HIV. Talk with your health care provider about whether you are at high risk of being infected with HIV. If you choose to begin PrEP, you should first be tested for HIV. You should then be tested every 3 months for as long as you are taking PrEP.  PREGNANCY   If you are premenopausal and you may become pregnant, ask your health care provider about preconception counseling.  If you may  become pregnant, take 400 to 800 micrograms (mcg) of folic acid every day.  If you want to prevent pregnancy, talk to your health care provider about birth control (contraception). OSTEOPOROSIS AND MENOPAUSE   Osteoporosis is a disease in which the bones lose minerals and strength with aging. This can result in serious bone fractures. Your risk for osteoporosis can be identified using a bone density scan.  If you are 61 years of age or older, or if you are at risk for osteoporosis and fractures, ask your health care provider if you should be screened.  Ask your health care provider whether you should take a calcium or vitamin D supplement to lower your risk for osteoporosis.  Menopause may have certain physical symptoms and risks.  Hormone replacement therapy may reduce some of these symptoms and risks. Talk to your health care provider about whether hormone replacement therapy is right for you.  HOME CARE INSTRUCTIONS   Schedule regular health, dental, and eye exams.  Stay current with your immunizations.   Do not use any tobacco products including cigarettes, chewing tobacco, or electronic cigarettes.  If you are pregnant, do not drink alcohol.  If you are breastfeeding, limit how much and how often you drink alcohol.  Limit alcohol intake to no more than 1 drink per day for nonpregnant women. One drink equals 12 ounces of beer, 5 ounces of wine, or 1 ounces of hard liquor.  Do not use street drugs.  Do not share needles.  Ask your health care provider for help if  you need support or information about quitting drugs.  Tell your health care provider if you often feel depressed.  Tell your health care provider if you have ever been abused or do not feel safe at home.   This information is not intended to replace advice given to you by your health care provider. Make sure you discuss any questions you have with your health care provider.   Document Released: 01/18/2011 Document Revised: 07/26/2014 Document Reviewed: 06/06/2013 Elsevier Interactive Patient Education [employer].

## 2016-05-20 NOTE — Progress Notes (Signed)
Patient: Andrea Alvarez Bourke, Female    DOB: 1965/07/23, 50 y.o.   MRN: [id number] Visit Date: 05/20/2016  Today's Provider: Margaretann LovelessJennifer Alvarez Lorita Forinash, PA-C   Chief Complaint  Patient presents with  . Annual Exam   Subjective:    Annual physical exam Andrea Alvarez Stucker is a 50 y.o. female who presents today for health maintenance and complete physical. She feels well. She reports exercising 4 days a weeks. She reports she is sleeping well.  05/16/15 CPE 05/17/14 Pap-neg -----------------------------------------------------------------   Review of Systems  Constitutional: Negative.   HENT: Negative.   Eyes: Negative.   Respiratory: Negative.   Cardiovascular: Negative.   Gastrointestinal: Positive for blood in stool and constipation.  Endocrine: Negative.   Genitourinary: Negative.   Musculoskeletal: Positive for back pain.  Skin: Negative.   Allergic/Immunologic: Negative.   Neurological: Negative.   Hematological: Negative.   Psychiatric/Behavioral: Positive for sleep disturbance.    Social History      She  reports that she has never smoked. She has never used smokeless tobacco. She reports that she drinks alcohol. She reports that she does not use drugs.       Social History   Social History  . Marital status: Married    Spouse name: N/A  . Number of children: N/A  . Years of education: N/A   Social History Main Topics  . Smoking status: Never Smoker  . Smokeless tobacco: Never Used  . Alcohol use Yes     Comment: Occasional alcohol use; once or twice a year  . Drug use: No  . Sexual activity: Not Asked   Other Topics Concern  . None   Social History Narrative  . None    Past Medical History:  Diagnosis Date  . Anemia   . GERD (gastroesophageal reflux disease)   . Obesity      Patient Active Problem List   Diagnosis Date Noted  . BMI 35.0-35.9,adult 03/25/2016  . Abdominal pain, right upper quadrant 05/13/2015  . 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  Acid reflux 05/13/2015  . LBP (low back pain) 05/13/2015  . Headache, migraine 05/13/2015  . Night sweat 05/13/2015  . Obesity 05/13/2015  . Fecal occult blood test positive 05/13/2015    Past Surgical History:  Procedure Laterality Date  . BREAST SURGERY  02/1999   status post bilateral breast reduction  . 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 TUBAL LIGATION      Family History        Family Status  Relation Status  . Mother Alive   Melanoma  . Father Alive   Has had a stent  . Sister Alive  . Maternal Grandmother Deceased at age 50   died from an MI  . Paternal Grandmother Deceased        Her family history includes Asthma in her paternal grandmother and sister; Coronary artery disease in her maternal grandmother and paternal grandmother; Heart attack in her paternal grandmother; Heart disease in her father and sister; Hypertension in her father.    No Known Allergies  Current Meds  Medication Sig  . phentermine (ADIPEX-P) 37.5 MG tablet Take 1 tablet (37.5 mg total) by mouth daily before breakfast.    Patient Care Team: Margaretann LovelessJennifer Alvarez Abbigayle Toole, PA-C as PCP - General (Family Medicine)     Objective:   Vitals: BP 110/70 (BP Location: Right Arm, Patient Position: Sitting, Cuff Size: Large)   Pulse 72  Temp 98.1 F (36.7 C) (Oral)   Resp 16   Ht 5\' 2"  (1.575 Alvarez)   Wt 195 lb (88.5 kg)   BMI 35.67 kg/Alvarez    Physical Exam  Constitutional: She is oriented to person, place, and time. She appears well-developed and well-nourished. No distress.  HENT:  Head: Normocephalic and atraumatic.  Right Ear: Hearing, tympanic membrane, external ear and ear canal normal.  Left Ear: Hearing, tympanic membrane, external ear and ear canal normal.  Nose: Nose normal.  Mouth/Throat: Uvula is midline, oropharynx is clear and moist and mucous membranes are normal. No oropharyngeal exudate.  Eyes: Conjunctivae, EOM and lids are normal. Pupils are equal,  round, and reactive to light. Right eye exhibits no discharge. Left eye exhibits no discharge. No scleral icterus.  Neck: Trachea normal and normal range of motion. Neck supple. No JVD present. Carotid bruit is not present. No tracheal deviation present. No thyroid mass and no thyromegaly present.  Cardiovascular: Normal rate, regular rhythm, normal heart sounds and intact distal pulses.  Exam reveals no gallop and no friction rub.   No murmur heard. Pulmonary/Chest: Effort normal and breath sounds normal. No respiratory distress. She has no decreased breath sounds. She has no wheezes. She has no rhonchi. She has no rales. She exhibits no tenderness.  Abdominal: Soft. Normal appearance and bowel sounds are normal. She exhibits no distension and no mass. There is no hepatosplenomegaly. There is no tenderness. There is no rebound and no guarding.  Genitourinary: No breast swelling, tenderness or discharge.  Musculoskeletal: Normal range of motion. She exhibits no edema or tenderness.  Lymphadenopathy:    She has no cervical adenopathy.    She has no axillary adenopathy.  Neurological: She is alert and oriented to person, place, and time. She has normal strength. No cranial nerve deficit.  Skin: Skin is warm, dry and intact. No rash noted. She is not diaphoretic.  Psychiatric: She has a normal mood and affect. Her speech is normal and behavior is normal. Judgment and thought content normal. Cognition and memory are normal.  Vitals reviewed.   Depression Screen PHQ 2/9 Scores 05/20/2016  PHQ - 2 Score 0    Assessment & Plan:     Routine Health Maintenance and Physical Exam  Exercise Activities and Dietary recommendations Goals    None      Immunization History  Administered Date(s) Administered  . Influenza,inj,Quad PF,36+ Mos 05/16/2015, 04/23/2016  . Tdap 05/13/2014    Health Maintenance  Topic Date Due  . HIV Screening  10/12/1980  . MAMMOGRAM  10/13/2015  . COLONOSCOPY   10/13/2015  . PAP SMEAR  05/17/2017  . TETANUS/TDAP  05/13/2024  . INFLUENZA VACCINE  Completed      Discussed health benefits of physical activity, and encouraged her to engage in regular exercise appropriate for her age and condition.    1. Annual physical exam UA normal. Normal physical exam today. Will check labs as below and f/u pending lab results. If labs are stable and WNL she will not need to have these rechecked for one year at her next annual physical exam. She is to call the office in the meantime if she has any acute issue, questions or concerns. - POCT urinalysis dipstick - CBC with Differential - Comprehensive metabolic panel - TSH  2. Breast cancer screening Breast exam today was normal. There is no family history of breast cancer. She does perform regular self breast exams. Mammogram was ordered as below. Patient [facility]  [facility].  - Mammogram Digital Screening; Future  3. Colon cancer screening No family history of colon cancer. Will refer to GI for colonoscopy. - Ambulatory referral to Gastroenterology  4. Family history of diabetes mellitus (DM) Will check labs as below and f/u pending results. - Hemoglobin A1c  5. Encounter for lipid screening for cardiovascular disease Will check labs as below and f/u pending results. - Lipid panel  --------------------------------------------------------------------    Margaretann LovelessJennifer Alvarez Jeanett Antonopoulos, PA-C  [facility]

## 2016-05-21 ENCOUNTER — Telehealth: Payer: Self-pay

## 2016-05-21 LAB — CBC WITH DIFFERENTIAL/PLATELET
BASOS ABS: 0.1 10*3/uL (ref 0.0–0.2)
BASOS: 1 %
EOS (ABSOLUTE): 0.2 10*3/uL (ref 0.0–0.4)
Eos: 3 %
Hematocrit: 35.4 % (ref 34.0–46.6)
Hemoglobin: 11.2 g/dL (ref 11.1–15.9)
Immature Grans (Abs): 0 10*3/uL (ref 0.0–0.1)
Immature Granulocytes: 0 %
LYMPHS ABS: 2 10*3/uL (ref 0.7–3.1)
Lymphs: 36 %
MCH: 25.3 pg — AB (ref 26.6–33.0)
MCHC: 31.6 g/dL (ref 31.5–35.7)
MCV: 80 fL (ref 79–97)
MONOS ABS: 0.7 10*3/uL (ref 0.1–0.9)
Monocytes: 13 %
NEUTROS ABS: 2.6 10*3/uL (ref 1.4–7.0)
Neutrophils: 47 %
PLATELETS: 398 10*3/uL — AB (ref 150–379)
RBC: 4.43 x10E6/uL (ref 3.77–5.28)
RDW: 16.9 % — ABNORMAL HIGH (ref 12.3–15.4)
WBC: 5.4 10*3/uL (ref 3.4–10.8)

## 2016-05-21 LAB — COMPREHENSIVE METABOLIC PANEL
ALBUMIN: 4.6 g/dL (ref 3.5–5.5)
ALT: 16 IU/L (ref 0–32)
AST: 14 IU/L (ref 0–40)
Albumin/Globulin Ratio: 1.5 (ref 1.2–2.2)
Alkaline Phosphatase: 67 IU/L (ref 39–117)
BUN / CREAT RATIO: 16 (ref 9–23)
BUN: 12 mg/dL (ref 6–24)
Bilirubin Total: 0.2 mg/dL (ref 0.0–1.2)
CALCIUM: 9.5 mg/dL (ref 8.7–10.2)
CO2: 25 mmol/L (ref 18–29)
CREATININE: 0.76 mg/dL (ref 0.57–1.00)
Chloride: 101 mmol/L (ref 96–106)
GFR calc Af Amer: 106 mL/min/{1.73_m2} (ref >59)
GFR, EST NON AFRICAN AMERICAN: 92 mL/min/{1.73_m2} (ref >59)
GLOBULIN, TOTAL: 3.1 g/dL (ref 1.5–4.5)
Glucose: 90 mg/dL (ref 65–99)
Potassium: 4.4 mmol/L (ref 3.5–5.2)
SODIUM: 140 mmol/L (ref 134–144)
TOTAL PROTEIN: 7.7 g/dL (ref 6.0–8.5)

## 2016-05-21 LAB — LIPID PANEL
CHOL/HDL RATIO: 3.2 ratio (ref 0.0–4.4)
Cholesterol, Total: 218 mg/dL — ABNORMAL HIGH (ref 100–199)
HDL: 68 mg/dL (ref >39)
LDL Calculated: 122 mg/dL — ABNORMAL HIGH (ref 0–99)
TRIGLYCERIDES: 138 mg/dL (ref 0–149)
VLDL CHOLESTEROL CAL: 28 mg/dL (ref 5–40)

## 2016-05-21 LAB — HEMOGLOBIN A1C
ESTIMATED AVERAGE GLUCOSE: 100 mg/dL
Hgb A1c MFr Bld: 5.1 % (ref 4.8–5.6)

## 2016-05-21 LAB — TSH: TSH: 1.94 u[IU]/mL (ref 0.450–4.500)

## 2016-05-21 NOTE — Telephone Encounter (Signed)
Patient was advised. KW 

## 2016-05-21 NOTE — Telephone Encounter (Signed)
[id number] 

## 2016-05-21 NOTE — Telephone Encounter (Signed)
-----   Message from Margaretann LovelessJennifer M Burnette, PA-C sent at 05/21/2016  8:44 AM EDT ----- Cholesterol borderline high but HDL is elevated which offers cardioprotection. All other labs are WNL and/or stable.

## 2016-07-01 ENCOUNTER — Ambulatory Visit (AMBULATORY_SURGERY_CENTER): Payer: Self-pay | Admitting: *Deleted

## 2016-07-01 VITALS — Ht 62.0 in | Wt 199.0 lb

## 2016-07-01 DIAGNOSIS — Z1211 Encounter for screening for malignant neoplasm of colon: Secondary | ICD-10-CM

## 2016-07-01 MED ORDER — NA SULFATE-K SULFATE-MG SULF 17.5-3.13-1.6 GM/177ML PO SOLN: 1.0000 | Freq: Once | ORAL | 0 refills | Status: AC

## 2016-07-01 NOTE — Progress Notes (Signed)
No egg or soy allergy known to patient  No issues with past sedation with any surgeries  or procedures, no intubation problems  No diet pills per patient- pt stopped phentermine 04-2016 No home 02 use per patient  No blood thinners per patient  Pt denies issues with constipation  No A fib or A flutter   emmi declined'

## 2016-07-05 ENCOUNTER — Encounter: Payer: Self-pay | Admitting: Gastroenterology

## 2016-07-15 ENCOUNTER — Encounter: Payer: Self-pay | Admitting: Gastroenterology

## 2016-07-15 ENCOUNTER — Ambulatory Visit (AMBULATORY_SURGERY_CENTER): Payer: BLUE CROSS/BLUE SHIELD | Admitting: Gastroenterology

## 2016-07-15 VITALS — BP 121/55 | HR 72 | Temp 98.6°F | Resp 23 | Ht 62.0 in | Wt 199.0 lb

## 2016-07-15 DIAGNOSIS — Z1212 Encounter for screening for malignant neoplasm of rectum: Secondary | ICD-10-CM | POA: Diagnosis not present

## 2016-07-15 DIAGNOSIS — Z1211 Encounter for screening for malignant neoplasm of colon: Secondary | ICD-10-CM

## 2016-07-15 MED ORDER — SODIUM CHLORIDE 0.9 % IV SOLN: 500.0000 mL | INTRAVENOUS | Status: DC

## 2016-07-15 NOTE — Op Note (Signed)
[facility] Patient Name: Andrea Alvarez Procedure Date: 07/15/2016 12:03 PM MRN: [id number] Endoscopist: Napoleon Form , MD Age: 50 Referring MD:  Date of Birth: 03-17-1966 Gender: Female Account #: [id number] Procedure:                Colonoscopy Indications:              Screening for colorectal malignant neoplasm, This                            is the patient's first colonoscopy Medicines:                Monitored Anesthesia Care Procedure:                Pre-Anesthesia Assessment:                           - Prior to the procedure, a History and Physical                            was performed, and patient medications and                            allergies were reviewed. The patient's tolerance of                            previous anesthesia was also reviewed. The risks                            and benefits of the procedure and the sedation                            options and risks were discussed with the patient.                            All questions were answered, and informed consent                            was obtained. Prior Anticoagulants: The patient has                            taken no previous anticoagulant or antiplatelet                            agents. ASA Grade Assessment: II - A patient with                            mild systemic disease. After reviewing the risks                            and benefits, the patient was deemed in                            satisfactory condition to undergo the procedure.  After obtaining informed consent, the colonoscope                            was passed under direct vision. Throughout the                            procedure, the patient's blood pressure, pulse, and                            oxygen saturations were monitored continuously. The                            Model CF-HQ190L [id number]) scope was introduced                            through the anus  and advanced to the the terminal                            ileum, with identification of the appendiceal                            orifice and IC valve. The colonoscopy was performed                            without difficulty. The patient tolerated the                            procedure well. The quality of the bowel                            preparation was excellent. The terminal ileum,                            ileocecal valve, appendiceal orifice, and rectum                            were photographed. Scope In: 12:05:15 PM Scope Out: 12:20:44 PM Scope Withdrawal Time: 0 hours 7 minutes 8 seconds  Total Procedure Duration: 0 hours 15 minutes 29 seconds  Findings:                 The perianal and digital rectal examinations were                            normal.                           The entire examined colon appeared normal. Complications:            No immediate complications. Estimated Blood Loss:     Estimated blood loss: none. Impression:               - The entire examined colon is normal.                           - No specimens collected. Recommendation:           -  Patient has a contact number available for                            emergencies. The signs and symptoms of potential                            delayed complications were discussed with the                            patient. Return to normal activities tomorrow.                            Written discharge instructions were provided to the                            patient.                           - Resume previous diet.                           - Continue present medications.                           - Repeat colonoscopy in 10 years for screening                            purposes. Napoleon FormKavitha V. Dayjah Selman, MD 07/15/2016 12:24:01 PM This report has been signed electronically.

## 2016-07-15 NOTE — Progress Notes (Signed)
A/ox3 pleased with MAC, report to [employer]ry[employer] CorporationCellia RN

## 2016-07-15 NOTE — Patient Instructions (Signed)
Discharge instructions given. Normal exam. Resume previous medications. YOU HAD AN ENDOSCOPIC PROCEDURE TODAY AT THE [facility]:   Refer to the procedure report that was given to you for any specific questions about what was found during the examination.  If the procedure report does not answer your questions, please call your gastroenterologist to clarify.  If you requested that your care partner not be given the details of your procedure findings, then the procedure report has been included in a sealed envelope for you to review at your convenience later.  YOU SHOULD EXPECT: Some feelings of bloating in the abdomen. Passage of more gas than usual.  Walking can help get rid of the air that was put into your GI tract during the procedure and reduce the bloating. If you had a lower endoscopy (such as a colonoscopy or flexible sigmoidoscopy) you may notice spotting of blood in your stool or on the toilet paper. If you underwent a bowel prep for your procedure, you may not have a normal bowel movement for a few days.  Please Note:  You might notice some irritation and congestion in your nose or some drainage.  This is from the oxygen used during your procedure.  There is no need for concern and it should clear up in a day or so.  SYMPTOMS TO REPORT IMMEDIATELY:   Following lower endoscopy (colonoscopy or flexible sigmoidoscopy):  Excessive amounts of blood in the stool  Significant tenderness or worsening of abdominal pains  Swelling of the abdomen that is new, acute  Fever of 100F or higher   For urgent or emergent issues, a gastroenterologist can be reached at any hour by calling [phone].   DIET:  We do recommend a small meal at first, but then you may proceed to your regular diet.  Drink plenty of fluids but you should avoid alcoholic beverages for 24 hours.  ACTIVITY:  You should plan to take it easy for the rest of today and you should NOT DRIVE or use heavy machinery  until tomorrow (because of the sedation medicines used during the test).    FOLLOW UP: Our staff will call the number listed on your records the next business day following your procedure to check on you and address any questions or concerns that you may have regarding the information given to you following your procedure. If we do not reach you, we will leave a message.  However, if you are feeling well and you are not experiencing any problems, there is no need to return our call.  We will assume that you have returned to your regular daily activities without incident.  If any biopsies were taken you will be contacted by phone or by letter within the next 1-3 weeks.  Please call us at [phone] if you have not heard about the biopsies in 3 weeks.    SIGNATURES/CONFIDENTIALITY: You and/or your care partner have signed paperwork which will be entered into your electronic medical record.  These signatures attest to the fact that that the information above on your After Visit Summary has been reviewed and is understood.  Full responsibility of the confidentiality of this discharge information lies with you and/or your care-partner. 

## 2016-07-16 ENCOUNTER — Telehealth: Payer: Self-pay | Admitting: *Deleted

## 2016-07-16 NOTE — Telephone Encounter (Signed)
No answer left message to call back with questions or concerns. SM

## 2018-02-01 ENCOUNTER — Encounter: Payer: Self-pay | Admitting: Physician Assistant

## 2018-02-01 ENCOUNTER — Ambulatory Visit (INDEPENDENT_AMBULATORY_CARE_PROVIDER_SITE_OTHER): Payer: 59 | Admitting: Physician Assistant

## 2018-02-01 VITALS — BP 128/80 | HR 82 | Temp 98.1°F | Resp 16 | Ht 62.0 in | Wt 210.6 lb

## 2018-02-01 DIAGNOSIS — Z0000 Encounter for general adult medical examination without abnormal findings: Secondary | ICD-10-CM

## 2018-02-01 DIAGNOSIS — Z1231 Encounter for screening mammogram for malignant neoplasm of breast: Secondary | ICD-10-CM

## 2018-02-01 DIAGNOSIS — Z136 Encounter for screening for cardiovascular disorders: Secondary | ICD-10-CM

## 2018-02-01 DIAGNOSIS — L821 Other seborrheic keratosis: Secondary | ICD-10-CM

## 2018-02-01 DIAGNOSIS — Z114 Encounter for screening for human immunodeficiency virus [HIV]: Secondary | ICD-10-CM | POA: Diagnosis not present

## 2018-02-01 DIAGNOSIS — M2142 Flat foot [pes planus] (acquired), left foot: Secondary | ICD-10-CM

## 2018-02-01 DIAGNOSIS — M2141 Flat foot [pes planus] (acquired), right foot: Secondary | ICD-10-CM | POA: Diagnosis not present

## 2018-02-01 DIAGNOSIS — Z131 Encounter for screening for diabetes mellitus: Secondary | ICD-10-CM

## 2018-02-01 DIAGNOSIS — Z124 Encounter for screening for malignant neoplasm of cervix: Secondary | ICD-10-CM | POA: Diagnosis not present

## 2018-02-01 DIAGNOSIS — M25572 Pain in left ankle and joints of left foot: Secondary | ICD-10-CM

## 2018-02-01 DIAGNOSIS — M25571 Pain in right ankle and joints of right foot: Secondary | ICD-10-CM

## 2018-02-01 DIAGNOSIS — Z13220 Encounter for screening for lipoid disorders: Secondary | ICD-10-CM | POA: Diagnosis not present

## 2018-02-01 DIAGNOSIS — Z126 Encounter for screening for malignant neoplasm of bladder: Secondary | ICD-10-CM | POA: Diagnosis not present

## 2018-02-01 DIAGNOSIS — Z1239 Encounter for other screening for malignant neoplasm of breast: Secondary | ICD-10-CM

## 2018-02-01 LAB — POCT URINALYSIS DIPSTICK
Appearance: NORMAL
Bilirubin, UA: NEGATIVE
GLUCOSE UA: NEGATIVE
Ketones, UA: NEGATIVE
LEUKOCYTES UA: NEGATIVE
Nitrite, UA: NEGATIVE
PH UA: 7 (ref 5.0–8.0)
Protein, UA: NEGATIVE
RBC UA: NEGATIVE
Spec Grav, UA: 1.01 (ref 1.010–1.025)
Urobilinogen, UA: 0.2 E.U./dL

## 2018-02-01 NOTE — Progress Notes (Signed)
Patient: Andrea Alvarez, Female    DOB: 04-05-1966, 52 y.o.   MRN: [id number] Visit Date: 02/01/2018  Today's Provider: Margaretann LovelessJennifer M Tyrice Hewitt, PA-C   Chief Complaint  Patient presents with  . Annual Exam   Subjective:    Annual physical exam Andrea Alvarez is a 52 y.o. female who presents today for health maintenance and complete physical. She feels well. She reports exercising, walking. She reports she is sleeping poorly. She reports she gets 2-4 hrs.  Colonoscopy: 07/15/16-Normal ---------------------------------------------------------------- Patient reports that she found out that her current father is her step-dad and that her biological father died at 5550 yrs of age from bladder cancer. Family hx updated.  Review of Systems  Constitutional: Positive for fatigue.  HENT: Negative.   Eyes: Negative.   Respiratory: Negative.   Cardiovascular: Positive for leg swelling.  Gastrointestinal: Positive for blood in stool, constipation and nausea.  Endocrine: Negative.   Genitourinary: Negative.   Musculoskeletal: Positive for arthralgias, back pain and myalgias.  Skin: Negative.   Allergic/Immunologic: Negative.   Neurological: Negative.   Hematological: Negative.   Psychiatric/Behavioral: Positive for sleep disturbance.    Social History      She  reports that she has never smoked. She has never used smokeless tobacco. She reports that she drinks alcohol. She reports that she does not use drugs.       Social History   Socioeconomic History  . Marital status: Married    Spouse name: Not on file  . Number of children: Not on file  . Years of education: Not on file  . Highest education level: Not on file  Occupational History  . Not on file  Social Needs  . Financial resource strain: Not on file  . Food insecurity:    Worry: Not on file    Inability: Not on file  . Transportation needs:    Medical: Not on file    Non-medical: Not on file  Tobacco Use  .  Smoking status: Never Smoker  . Smokeless tobacco: Never Used  Substance and Sexual Activity  . Alcohol use: Yes    Comment: Occasional alcohol use; once or twice a year  . Drug use: No  . Sexual activity: Not on file  Lifestyle  . Physical activity:    Days per week: Not on file    Minutes per session: Not on file  . Stress: Not on file  Relationships  . Social connections:    Talks on phone: Not on file    Gets together: Not on file    Attends religious service: Not on file    Active member of club or organization: Not on file    Attends meetings of clubs or organizations: Not on file    Relationship status: Not on file  Other Topics Concern  . Not on file  Social History Narrative  . Not on file    Past Medical History:  Diagnosis Date  . Anemia   . GERD (gastroesophageal reflux disease)   . Obesity      Patient Active Problem List   Diagnosis Date Noted  . BMI 35.0-35.9,adult 03/25/2016  . Abdominal pain, right upper quadrant 05/13/2015  . 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 Acid reflux 05/13/2015  . LBP (low back pain) 05/13/2015  . Headache, migraine 05/13/2015  . Night sweat 05/13/2015  . Obesity 05/13/2015  . Fecal occult blood test positive 05/13/2015    Past Surgical History:  Procedure Laterality  Date  . BREAST SURGERY  02/1999   status post bilateral breast reduction  . 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 TUBAL LIGATION      Family History        Family Status  Relation Name Status  . Mother  Alive       Melanoma  . Father  Deceased  . Sister  Alive  . MGM  Deceased at age 32       died from an MI  . PGM  Deceased  . Neg Hx  (Not Specified)        Her family history includes Asthma in her paternal grandmother and sister; Cancer in her father; Coronary artery disease in her maternal grandmother and paternal grandmother; Heart attack in her paternal grandmother; Heart disease in her sister. There is no history of  Colon cancer, Colon polyps, Esophageal cancer, Rectal cancer, or Stomach cancer.      No Known Allergies   Current Outpatient Medications:  .  phentermine (ADIPEX-P) 37.5 MG tablet, Take 1 tablet (37.5 mg total) by mouth daily before breakfast. (Patient not taking: Reported on 07/15/2016), Disp: 30 tablet, Rfl: 0  Current Facility-Administered Medications:  .  0.9 %  sodium chloride infusion, 500 mL, Intravenous, Continuous, Nandigam, Eleonore Chiquito, MD   Patient Care Team: [name], PA-C as PCP - General (Family Medicine)      Objective:   Vitals: BP 128/80 (BP Location: Left Arm, Patient Position: Sitting, Cuff Size: Normal)   Pulse 82   Temp 98.1 F (36.7 C) (Oral)   Resp 16   Ht 5\' 2"  (1.575 m)   Wt 210 lb 9.6 oz (95.5 kg)   LMP 11/16/2016 (Approximate)   SpO2 98%   BMI 38.52 kg/m    Vitals:   02/01/18 1519  BP: 128/80  Pulse: 82  Resp: 16  Temp: 98.1 F (36.7 C)  TempSrc: Oral  SpO2: 98%  Weight: 210 lb 9.6 oz (95.5 kg)  Height: 5\' 2"  (1.575 m)     Physical Exam  Constitutional: She is oriented to person, place, and time. She appears well-developed and well-nourished. No distress.  HENT:  Head: Normocephalic and atraumatic.  Right Ear: Hearing, tympanic membrane, external ear and ear canal normal.  Left Ear: Hearing, tympanic membrane, external ear and ear canal normal.  Nose: Nose normal.  Mouth/Throat: Uvula is midline, oropharynx is clear and moist and mucous membranes are normal. No oropharyngeal exudate.  Eyes: Pupils are equal, round, and reactive to light. Conjunctivae and EOM are normal. Right eye exhibits no discharge. Left eye exhibits no discharge. No scleral icterus.  Neck: Normal range of motion. Neck supple. No JVD present. Carotid bruit is not present. No tracheal deviation present. No thyromegaly present.  Cardiovascular: Normal rate, regular rhythm, normal heart sounds and intact distal pulses. Exam reveals no gallop and no friction  rub.  No murmur heard. Pulmonary/Chest: Effort normal and breath sounds normal. No respiratory distress. She has no wheezes. She has no rales. She exhibits no tenderness. Right breast exhibits no inverted nipple, no mass, no nipple discharge, no skin change and no tenderness. Left breast exhibits no inverted nipple, no mass, no nipple discharge, no skin change and no tenderness. No breast tenderness, discharge or bleeding. Breasts are symmetrical.  Abdominal: Soft. Bowel sounds are normal. She exhibits no distension and no mass. There is no tenderness. There is no rebound and no guarding. Hernia confirmed negative in the right inguinal  area and confirmed negative in the left inguinal area.  Genitourinary: Rectum normal, vagina normal and uterus normal. No breast tenderness, discharge or bleeding. Pelvic exam was performed with patient supine. There is no rash, tenderness, lesion or injury on the right labia. There is no rash, tenderness, lesion or injury on the left labia. Cervix exhibits no motion tenderness, no discharge and no friability. Right adnexum displays no mass, no tenderness and no fullness. Left adnexum displays no mass, no tenderness and no fullness. No erythema, tenderness or bleeding in the vagina. No signs of injury around the vagina. No vaginal discharge found.  Musculoskeletal: Normal range of motion. She exhibits no edema or tenderness.  Lymphadenopathy:    She has no cervical adenopathy.       Right: No inguinal adenopathy present.       Left: No inguinal adenopathy present.  Neurological: She is alert and oriented to person, place, and time. She has normal reflexes. No cranial nerve deficit. Coordination normal.  Skin: Skin is warm and dry. No rash noted. She is not diaphoretic.     Psychiatric: She has a normal mood and affect. Her behavior is normal. Judgment and thought content normal.  Vitals reviewed.    Depression Screen PHQ 2/9 Scores 02/01/2018 05/20/2016  PHQ - 2  Score 0 0      Assessment & Plan:     Routine Health Maintenance and Physical Exam  Exercise Activities and Dietary recommendations Goals    None      Immunization History  Administered Date(s) Administered  . Influenza,inj,Quad PF,6+ Mos 05/16/2015, 04/23/2016  . Tdap 05/13/2014    Health Maintenance  Topic Date Due  . HIV Screening  10/12/1980  . MAMMOGRAM  10/13/2015  . PAP SMEAR  05/17/2017  . INFLUENZA VACCINE  02/16/2018  . TETANUS/TDAP  05/13/2024  . COLONOSCOPY  07/15/2026     Discussed health benefits of physical activity, and encouraged her to engage in regular exercise appropriate for her age and condition.    1. Annual physical exam Normal physical exam today. Will check labs as below and f/u pending lab results. If labs are stable and WNL she will not need to have these rechecked for one year at her next annual physical exam. She is to call the office in the meantime if she has any acute issue, questions or concerns. - CBC with Differential/Platelet - Comprehensive metabolic panel - Hemoglobin A1c - TSH  2. Breast cancer screening Breast exam today was normal. There is no family history of breast cancer. She does perform regular self breast exams. Mammogram was ordered as below. Information for [facility] was given to patient so she may schedule her mammogram at her convenience. - MM DIGITAL SCREENING BILATERAL; Future  3. Encounter for lipid screening for cardiovascular disease Will check labs as below and f/u pending results. - Lipid panel  4. Encounter for screening for diabetes mellitus Will check labs as below and f/u pending results. - Hemoglobin A1c  5. Encounter for screening for HIV - HIV antibody  6. Encounter for screening for cervical cancer  Pap collected today. Will send as below and f/u pending results. - Pap IG and HPV (high risk) DNA detection  7. Bladder cancer screening Biological father passed from bladder  cancer. He did smoke and drink. Patient never knew him as he passed in 2007 but just found out this year that he was her father. Will check UA for hematuria as below. UA normal.  -  POCT Urinalysis Dipstick  8. Seborrheic keratoses Noted on right lateral hip area. Other lesions noted as well. Patient requesting derm referral for annual skin checks.  - Ambulatory referral to Dermatology  9. Pes planus of both feet Obese with low arches that flatten with standing. Now having bilateral foot pain that is radiating to the ankles. She is using heel cups currently but she may require further orthotics for more arch support to help her gait and pain levels. Referral placed as below.  - Ambulatory referral to Podiatry  10. Acute bilateral ankle pain See above medical treatment plan. - Ambulatory referral to Podiatry  --------------------------------------------------------------------    [name], PA-C  [facility]

## 2018-02-01 NOTE — Patient Instructions (Signed)

## 2018-02-02 ENCOUNTER — Telehealth: Payer: Self-pay

## 2018-02-02 LAB — CBC WITH DIFFERENTIAL/PLATELET
BASOS: 1 %
Basophils Absolute: 0.1 10*3/uL (ref 0.0–0.2)
EOS (ABSOLUTE): 0.1 10*3/uL (ref 0.0–0.4)
EOS: 2 %
HEMATOCRIT: 37.6 % (ref 34.0–46.6)
Hemoglobin: 12.7 g/dL (ref 11.1–15.9)
Immature Grans (Abs): 0 10*3/uL (ref 0.0–0.1)
Immature Granulocytes: 0 %
LYMPHS ABS: 2.4 10*3/uL (ref 0.7–3.1)
Lymphs: 34 %
MCH: 29.7 pg (ref 26.6–33.0)
MCHC: 33.8 g/dL (ref 31.5–35.7)
MCV: 88 fL (ref 79–97)
MONOS ABS: 0.7 10*3/uL (ref 0.1–0.9)
Monocytes: 10 %
NEUTROS ABS: 3.6 10*3/uL (ref 1.4–7.0)
Neutrophils: 53 %
Platelets: 330 10*3/uL (ref 150–450)
RBC: 4.27 x10E6/uL (ref 3.77–5.28)
RDW: 13.8 % (ref 12.3–15.4)
WBC: 6.9 10*3/uL (ref 3.4–10.8)

## 2018-02-02 LAB — COMPREHENSIVE METABOLIC PANEL
A/G RATIO: 1.7 (ref 1.2–2.2)
ALK PHOS: 70 IU/L (ref 39–117)
ALT: 22 IU/L (ref 0–32)
AST: 15 IU/L (ref 0–40)
Albumin: 4.5 g/dL (ref 3.5–5.5)
BILIRUBIN TOTAL: 0.2 mg/dL (ref 0.0–1.2)
BUN/Creatinine Ratio: 19 (ref 9–23)
BUN: 15 mg/dL (ref 6–24)
CO2: 23 mmol/L (ref 20–29)
Calcium: 9.4 mg/dL (ref 8.7–10.2)
Chloride: 102 mmol/L (ref 96–106)
Creatinine, Ser: 0.81 mg/dL (ref 0.57–1.00)
GFR calc Af Amer: 97 mL/min/{1.73_m2} (ref >59)
GFR calc non Af Amer: 84 mL/min/{1.73_m2} (ref >59)
GLOBULIN, TOTAL: 2.7 g/dL (ref 1.5–4.5)
Glucose: 96 mg/dL (ref 65–99)
POTASSIUM: 4.1 mmol/L (ref 3.5–5.2)
SODIUM: 140 mmol/L (ref 134–144)
Total Protein: 7.2 g/dL (ref 6.0–8.5)

## 2018-02-02 LAB — HEMOGLOBIN A1C
ESTIMATED AVERAGE GLUCOSE: 103 mg/dL
HEMOGLOBIN A1C: 5.2 % (ref 4.8–5.6)

## 2018-02-02 LAB — LIPID PANEL
CHOL/HDL RATIO: 3.1 ratio (ref 0.0–4.4)
Cholesterol, Total: 211 mg/dL — ABNORMAL HIGH (ref 100–199)
HDL: 68 mg/dL (ref >39)
LDL Calculated: 121 mg/dL — ABNORMAL HIGH (ref 0–99)
Triglycerides: 108 mg/dL (ref 0–149)
VLDL Cholesterol Cal: 22 mg/dL (ref 5–40)

## 2018-02-02 LAB — TSH: TSH: 3.57 u[IU]/mL (ref 0.450–4.500)

## 2018-02-02 LAB — HIV ANTIBODY (ROUTINE TESTING W REFLEX): HIV SCREEN 4TH GENERATION: NONREACTIVE

## 2018-02-02 NOTE — Telephone Encounter (Signed)
-----   Message from Margaretann LovelessJennifer M Burnette, [place] JerseyPA-C sent at 02/02/2018 12:26 PM EDT ----- All labs are within normal limits and stable.  Thanks! -JB

## 2018-02-02 NOTE — Telephone Encounter (Signed)
Attempted to contact patient. No answer and voicemail was full.

## 2018-02-03 NOTE — Telephone Encounter (Signed)
Left message advising pt.  (Per DPR)  Thanks,   -Laura  

## 2018-02-04 LAB — PAP IG AND HPV HIGH-RISK
HPV, high-risk: NEGATIVE
PAP Smear Comment: 0

## 2018-02-06 ENCOUNTER — Telehealth: Payer: Self-pay

## 2018-02-06 NOTE — Telephone Encounter (Signed)
LMTCB

## 2018-02-06 NOTE — Telephone Encounter (Signed)
-----   Message from Margaretann LovelessJennifer M Burnette, PA-C sent at 02/05/2018  8:53 PM EDT ----- Pap is normal and HPV negative. Will repeat in 3-5 years.

## 2018-02-06 NOTE — Telephone Encounter (Signed)
LMTCB  Thank,  -Joseline

## 2018-02-09 NOTE — Telephone Encounter (Signed)
No answer/mailbox is full.  Thanks,  -Joseline 

## 2018-02-10 NOTE — Telephone Encounter (Signed)
No answer. Mailbox full. 

## 2018-03-29 ENCOUNTER — Ambulatory Visit
Admission: RE | Admit: 2018-03-29 | Discharge: 2018-03-29 | Disposition: A | Discharge status: Home / Self Care | Payer: 59 | Source: Ambulatory Visit | Attending: Physician Assistant | Admitting: Physician Assistant

## 2018-03-29 DIAGNOSIS — Z1239 Encounter for other screening for malignant neoplasm of breast: Secondary | ICD-10-CM

## 2018-03-29 DIAGNOSIS — Z1231 Encounter for screening mammogram for malignant neoplasm of breast: Secondary | ICD-10-CM | POA: Insufficient documentation

## 2018-03-30 ENCOUNTER — Telehealth: Payer: Self-pay

## 2018-03-30 NOTE — Telephone Encounter (Signed)
NA. Voice mail full

## 2018-03-30 NOTE — Telephone Encounter (Signed)
-----   Message from Jennifer M Burnette, PA-C sent at 03/30/2018 10:13 AM EDT ----- Normal mammogram. Repeat screening in one year. 

## 2018-03-31 NOTE — Telephone Encounter (Signed)
No answer

## 2018-04-07 NOTE — Telephone Encounter (Signed)
Tried calling patient, no answer. Letter mailed informing results.

## 2018-05-04 ENCOUNTER — Encounter (INDEPENDENT_AMBULATORY_CARE_PROVIDER_SITE_OTHER): Payer: 59

## 2018-05-16 ENCOUNTER — Ambulatory Visit (INDEPENDENT_AMBULATORY_CARE_PROVIDER_SITE_OTHER): Payer: 59 | Admitting: Family Medicine

## 2018-05-16 ENCOUNTER — Encounter (INDEPENDENT_AMBULATORY_CARE_PROVIDER_SITE_OTHER): Payer: Self-pay | Admitting: Family Medicine

## 2018-05-16 ENCOUNTER — Other Ambulatory Visit: Payer: Self-pay

## 2018-05-16 ENCOUNTER — Ambulatory Visit: Payer: 59 | Admitting: Sports Medicine

## 2018-05-16 ENCOUNTER — Encounter: Payer: Self-pay | Admitting: Sports Medicine

## 2018-05-16 ENCOUNTER — Ambulatory Visit (INDEPENDENT_AMBULATORY_CARE_PROVIDER_SITE_OTHER): Payer: 59

## 2018-05-16 VITALS — BP 137/87 | HR 80

## 2018-05-16 VITALS — BP 128/81 | HR 69 | Temp 98.0°F | Ht 62.0 in | Wt 207.0 lb

## 2018-05-16 DIAGNOSIS — R0602 Shortness of breath: Secondary | ICD-10-CM | POA: Diagnosis not present

## 2018-05-16 DIAGNOSIS — Z1331 Encounter for screening for depression: Secondary | ICD-10-CM

## 2018-05-16 DIAGNOSIS — M79671 Pain in right foot: Secondary | ICD-10-CM

## 2018-05-16 DIAGNOSIS — M7752 Other enthesopathy of left foot: Secondary | ICD-10-CM | POA: Diagnosis not present

## 2018-05-16 DIAGNOSIS — M25571 Pain in right ankle and joints of right foot: Secondary | ICD-10-CM | POA: Diagnosis not present

## 2018-05-16 DIAGNOSIS — Z9189 Other specified personal risk factors, not elsewhere classified: Secondary | ICD-10-CM | POA: Diagnosis not present

## 2018-05-16 DIAGNOSIS — M7751 Other enthesopathy of right foot: Secondary | ICD-10-CM | POA: Diagnosis not present

## 2018-05-16 DIAGNOSIS — M722 Plantar fascial fibromatosis: Secondary | ICD-10-CM

## 2018-05-16 DIAGNOSIS — E782 Mixed hyperlipidemia: Secondary | ICD-10-CM

## 2018-05-16 DIAGNOSIS — Z6837 Body mass index (BMI) 37.0-37.9, adult: Secondary | ICD-10-CM

## 2018-05-16 DIAGNOSIS — M7750 Other enthesopathy of unspecified foot: Secondary | ICD-10-CM

## 2018-05-16 DIAGNOSIS — R5383 Other fatigue: Secondary | ICD-10-CM

## 2018-05-16 DIAGNOSIS — Z0289 Encounter for other administrative examinations: Secondary | ICD-10-CM

## 2018-05-16 DIAGNOSIS — E559 Vitamin D deficiency, unspecified: Secondary | ICD-10-CM | POA: Diagnosis not present

## 2018-05-16 DIAGNOSIS — M79672 Pain in left foot: Secondary | ICD-10-CM

## 2018-05-16 DIAGNOSIS — M779 Enthesopathy, unspecified: Secondary | ICD-10-CM | POA: Diagnosis not present

## 2018-05-16 DIAGNOSIS — R011 Cardiac murmur, unspecified: Secondary | ICD-10-CM

## 2018-05-16 MED ORDER — TRIAMCINOLONE ACETONIDE 10 MG/ML IJ SUSP: 10.0000 mg | Freq: Once | INTRAMUSCULAR | Status: DC

## 2018-05-16 NOTE — Patient Instructions (Addendum)

## 2018-05-16 NOTE — Progress Notes (Signed)
Subjective: Andrea Alvarez is a 52 y.o. female patient presents to office with complaint of moderate heel pain on the left for several months. Patient admits to post static dyskinesia for this entire duration and states that little by little the pain has been getting worse reports that on her right foot the pain is not at her heel it is actually at her ball of the foot underneath the second toe states with certain pressure or walking for work she can feel pain increasing and getting worse states that she has tried insoles and Aspercreme and the only relief she is getting as when she rubs the Aspercreme to her feet however it is not long-lived patient denies redness warmth swelling or any other constitutional symptoms at this time. Denies any other pedal complaints.   Review of Systems  Musculoskeletal: Positive for joint pain.  All other systems reviewed and are negative.    Patient Active Problem List   Diagnosis Date Noted  . BMI 35.0-35.9,adult 03/25/2016  . Abdominal pain, right upper quadrant 05/13/2015  . 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 Acid reflux 05/13/2015  . LBP (low back pain) 05/13/2015  . Headache, migraine 05/13/2015  . Night sweat 05/13/2015  . Obesity 05/13/2015  . Fecal occult blood test positive 05/13/2015    Current Outpatient Medications on File Prior to Visit  Medication Sig Dispense Refill  . naproxen sodium (ALEVE) 220 MG tablet Take 440 mg by mouth 2 (two) times daily as needed.     No current facility-administered medications on file prior to visit.     No Known Allergies  Objective: Physical Exam General: The patient is alert and oriented x3 in no acute distress.  Dermatology: Skin is warm, dry and supple bilateral lower extremities. Nails 1-10 are normal. There is no erythema, edema, no eccymosis, no open lesions present. Integument is otherwise unremarkable.  Vascular: Dorsalis Pedis pulse and Posterior Tibial pulse are 1/4 bilateral. Capillary fill  time is immediate to all digits.  Neurological: Grossly intact to light touch with an achilles reflex of +2/5 and a  negative Tinel's sign bilateral.  Musculoskeletal: Tenderness to palpation at the medial calcaneal tubercale and through the insertion of the plantar fascia on the left and pain to palpation at second metatarsophalangeal joint on right foot. No pain with compression of calcaneus bilateral. No pain with tuning fork to calcaneus bilateral. No pain with calf compression bilateral. There is decreased Ankle joint range of motion bilateral. All other joints range of motion within normal limits bilateral.  Mild lesser hammertoe deformity.  Pes cavus foot type.  Strength 5/5 in all groups bilateral.   Gait: Unassisted, Antalgic avoid weight on left/right heel  Xray, Right/Left foot:  Normal osseous mineralization. Joint spaces preserved except with mild curling of the fourth and fifth toes supportive of hammertoe deformity and pes cavus foot type. No fracture/dislocation/boney destruction. Calcaneal spur present with mild thickening of plantar fascia. No other soft tissue abnormalities or radiopaque foreign bodies.   Assessment and Plan: Problem List Items Addressed This Visit    None    Visit Diagnoses    Plantar fasciitis of left foot    -  Primary   Relevant Medications   triamcinolone acetonide (KENALOG) 10 MG/ML injection 10 mg   Other Relevant Orders   DG Foot Complete Right (Completed)   DG Foot Complete Left (Completed)   Capsulitis       Relevant Medications   triamcinolone acetonide (KENALOG) 10 MG/ML injection 10 mg  Toe joint pain, right       Bilateral foot pain          -Complete examination performed.  -Xrays reviewed -Discussed with patient in great detail condition of capsulitis at right second metatarsophalangeal joint -After oral consent and aseptic prep, injected a mixture containing 1 ml of 2%  plain lidocaine, 1 ml 0.5% plain marcaine, 0.5 ml of  kenalog 10 and 0.5 ml of dexamethasone phosphate into right second metatarsophalangeal joint without complication. Post-injection care discussed with patient.  -Discussed with patient in detail the condition of plantar fasciitis, how this occurs and general treatment options. Explained both conservative and surgical treatments.  -After oral consent and aseptic prep, injected a mixture containing 1 ml of 2%  plain lidocaine, 1 ml 0.5% plain marcaine, 0.5 ml of kenalog 10 and 0.5 ml of dexamethasone phosphate into left heel. Post-injection care discussed with patient.  -Recommended good supportive shoes and advised use of OTC insert as best tolerated and dispensed a night splint at today's visit to be on the left side -Explained and dispensed to patient daily stretching exercises. -Recommend patient to ice affected area 1-2x daily. -Patient to return to office in 4 weeks if pain is not better for follow up or sooner if problems or questions arise.  Asencion Islam, DPM

## 2018-05-17 ENCOUNTER — Encounter (INDEPENDENT_AMBULATORY_CARE_PROVIDER_SITE_OTHER): Payer: Self-pay | Admitting: Family Medicine

## 2018-05-17 LAB — CBC WITH DIFFERENTIAL
BASOS ABS: 0.1 10*3/uL (ref 0.0–0.2)
Basos: 2 %
EOS (ABSOLUTE): 0.2 10*3/uL (ref 0.0–0.4)
Eos: 3 %
Hematocrit: 40.8 % (ref 34.0–46.6)
Hemoglobin: 13.7 g/dL (ref 11.1–15.9)
IMMATURE GRANULOCYTES: 0 %
Immature Grans (Abs): 0 10*3/uL (ref 0.0–0.1)
LYMPHS: 31 %
Lymphocytes Absolute: 1.7 10*3/uL (ref 0.7–3.1)
MCH: 29.9 pg (ref 26.6–33.0)
MCHC: 33.6 g/dL (ref 31.5–35.7)
MCV: 89 fL (ref 79–97)
Monocytes Absolute: 0.5 10*3/uL (ref 0.1–0.9)
Monocytes: 10 %
NEUTROS PCT: 54 %
Neutrophils Absolute: 2.9 10*3/uL (ref 1.4–7.0)
RBC: 4.58 x10E6/uL (ref 3.77–5.28)
RDW: 12.9 % (ref 12.3–15.4)
WBC: 5.3 10*3/uL (ref 3.4–10.8)

## 2018-05-17 LAB — COMPREHENSIVE METABOLIC PANEL
A/G RATIO: 1.6 (ref 1.2–2.2)
ALK PHOS: 69 IU/L (ref 39–117)
ALT: 25 IU/L (ref 0–32)
AST: 22 IU/L (ref 0–40)
Albumin: 4.7 g/dL (ref 3.5–5.5)
BILIRUBIN TOTAL: 0.5 mg/dL (ref 0.0–1.2)
BUN/Creatinine Ratio: 14 (ref 9–23)
BUN: 10 mg/dL (ref 6–24)
CALCIUM: 10 mg/dL (ref 8.7–10.2)
CHLORIDE: 101 mmol/L (ref 96–106)
CO2: 25 mmol/L (ref 20–29)
Creatinine, Ser: 0.71 mg/dL (ref 0.57–1.00)
GFR calc Af Amer: 113 mL/min/{1.73_m2} (ref >59)
GFR calc non Af Amer: 98 mL/min/{1.73_m2} (ref >59)
Globulin, Total: 2.9 g/dL (ref 1.5–4.5)
Glucose: 85 mg/dL (ref 65–99)
POTASSIUM: 4.6 mmol/L (ref 3.5–5.2)
SODIUM: 141 mmol/L (ref 134–144)
Total Protein: 7.6 g/dL (ref 6.0–8.5)

## 2018-05-17 LAB — LIPID PANEL WITH LDL/HDL RATIO
Cholesterol, Total: 216 mg/dL — ABNORMAL HIGH (ref 100–199)
HDL: 78 mg/dL (ref >39)
LDL Calculated: 125 mg/dL — ABNORMAL HIGH (ref 0–99)
LDL/HDL RATIO: 1.6 ratio (ref 0.0–3.2)
Triglycerides: 63 mg/dL (ref 0–149)
VLDL CHOLESTEROL CAL: 13 mg/dL (ref 5–40)

## 2018-05-17 LAB — T4, FREE: Free T4: 1.16 ng/dL (ref 0.82–1.77)

## 2018-05-17 LAB — VITAMIN D 25 HYDROXY (VIT D DEFICIENCY, FRACTURES): VIT D 25 HYDROXY: 24.4 ng/mL — AB (ref 30.0–100.0)

## 2018-05-17 LAB — TSH: TSH: 2.81 u[IU]/mL (ref 0.450–4.500)

## 2018-05-17 LAB — HEMOGLOBIN A1C
ESTIMATED AVERAGE GLUCOSE: 103 mg/dL
HEMOGLOBIN A1C: 5.2 % (ref 4.8–5.6)

## 2018-05-17 LAB — VITAMIN B12: VITAMIN B 12: 371 pg/mL (ref 232–1245)

## 2018-05-17 LAB — INSULIN, RANDOM: INSULIN: 10.8 u[IU]/mL (ref 2.6–24.9)

## 2018-05-17 LAB — T3: T3 TOTAL: 131 ng/dL (ref 71–180)

## 2018-05-17 LAB — FOLATE: FOLATE: 15.9 ng/mL (ref >3.0)

## 2018-05-17 NOTE — Progress Notes (Signed)
Office: [phone]  /  Fax: [phone]   Dear Andrea Loveless, PA-C,   Thank you for referring Andrea Alvarez to our clinic. The following note includes my evaluation and treatment recommendations.  HPI:   Chief Complaint: OBESITY    Andrea Alvarez has been referred by Andrea Loveless, PA-C for consultation regarding her obesity and obesity related comorbidities.    Andrea Alvarez (MR# [id number]) is a 52 y.o. female who presents on 05/17/2018 for obesity evaluation and treatment. Current BMI is Body mass index is 37.86 kg/m.Andrea Alvarez has been struggling with her weight for many years and has been unsuccessful in either losing weight, maintaining weight loss, or reaching her healthy weight goal. Andrea Alvarez eats out every day and sometimes two times a day.     Andrea Alvarez attended our information session and states she is currently in the action stage of change and ready to dedicate time achieving and maintaining a healthier weight. Andrea Alvarez is interested in becoming our patient and working on intensive lifestyle modifications including (but not limited to) diet, exercise and weight loss.    Andrea Alvarez states her family eats meals together she thinks her family will eat healthier with  her her desired weight loss is 62 lbs she started gaining weight after having her tubes tied her heaviest weight ever was 217 lbs. she has significant food cravings issues  she frequently makes poor food choices she frequently eats larger portions than normal  she has binge eating behaviors she struggles with emotional eating    Fatigue Andrea Alvarez feels her energy is lower than it should be. This has worsened with weight gain and has not worsened recently. Andrea Alvarez admits to daytime somnolence and she admits to waking up still tired. Patient is at risk for obstructive sleep apnea. Patent has a history of symptoms of daytime fatigue, morning fatigue and morning headache. Patient generally gets 6 hours of sleep  per night, and states they generally have restful sleep. Snoring is present. Apneic episodes are not present. Epworth Sleepiness Score is 12  Dyspnea on exertion Andrea Alvarez notes increasing shortness of breath with exercising and seems to be worsening over time with weight gain. She notes getting out of breath sooner with activity than she used to. This has not gotten worse recently. [name] denies orthopnea.  Vitamin D deficiency Andrea Alvarez has a diagnosis of vitamin D deficiency. She is not currently taking vit D. Andrea Alvarez admits fatigue and denies nausea, vomiting or muscle weakness.  Hyperlipidemia (mixed) Andrea Alvarez has mixed hyperlipidemia and she is not on statin. Andrea Alvarez is attempting to control her cholesterol levels with intensive lifestyle modification including a low saturated fat diet, exercise and weight loss. She denies any chest pain.    At risk for cardiovascular disease Andrea Alvarez is at a higher than average risk for cardiovascular disease due to obesity and hyperlipidemia. She currently denies any chest pain.  New Onset Heart Murmur Andrea Alvarez has new onset heart murmur with mild shortness of breath with activity. Andrea Alvarez also has pitting edema in both legs. She denies chest pain or shortness of breath at rest.  Depression Screen Andrea Alvarez's Food and Mood (modified PHQ-9) score was  Depression screen PHQ 2/9 05/16/2018  Decreased Interest 1  Down, Depressed, Hopeless 0  PHQ - 2 Score 1  Altered sleeping 3  Tired, decreased energy 3  Change in appetite 1  Feeling bad or failure about yourself  0  Trouble concentrating 0  Moving slowly or fidgety/restless 0  Suicidal thoughts 0  PHQ-9 Score 8  Difficult doing work/chores Not difficult at all    ALLERGIES: No Known Allergies  MEDICATIONS: Current Outpatient Medications on File Prior to Visit  Medication Sig Dispense Refill  . naproxen sodium (ALEVE) 220 MG tablet Take 440 mg by mouth 2 (two) times daily as needed.     No current  facility-administered medications on file prior to visit.     PAST MEDICAL HISTORY: Past Medical History:  Diagnosis Date  . Anemia   . Back pain   . Constipation   . GERD (gastroesophageal reflux disease)   . Joint pain   . Leg edema   . Obesity   . Vitamin D deficiency     PAST SURGICAL HISTORY: Past Surgical History:  Procedure Laterality Date  . BREAST SURGERY  02/1999   status post bilateral breast reduction  . 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 REDUCTION MAMMAPLASTY Bilateral 1999  . TUBAL LIGATION      SOCIAL HISTORY: Social History   Tobacco Use  . Smoking status: Never Smoker  . Smokeless tobacco: Never Used  Substance Use Topics  . Alcohol use: Yes    Comment: Occasional alcohol use; once or twice a year  . Drug use: No    FAMILY HISTORY: Family History  Problem Relation Age of Onset  . Hypertension Mother   . Cancer Mother   . Cancer Father        bladder cancer  . Asthma Sister   . Heart disease Sister   . Coronary artery disease Maternal Grandmother   . Coronary artery disease Paternal Grandmother   . Heart attack Paternal Grandmother   . Asthma Paternal Grandmother   . Colon cancer Neg Hx   . Colon polyps Neg Hx   . Esophageal cancer Neg Hx   . Rectal cancer Neg Hx   . Stomach cancer Neg Hx     ROS: Review of Systems  Constitutional: Positive for malaise/fatigue.  Eyes:       + Wear Glasses or Contacts  Respiratory: Positive for shortness of breath (on exertion).   Cardiovascular: Negative for chest pain and orthopnea.       + Very Cold Feet or Hands  Gastrointestinal: Positive for constipation and heartburn. Negative for nausea and vomiting.  Musculoskeletal: Positive for back pain.       + Muscle or Joint Pain + Muscle Stiffness Negative for muscle weakness  Skin:       + Dryness + Hair or Nail Changes  Neurological: Positive for headaches.  Endo/Heme/Allergies: Bruises/bleeds easily (bruising).    Psychiatric/Behavioral: The patient has insomnia.     PHYSICAL EXAM: Blood pressure 128/81, pulse 69, temperature 98 F (36.7 C), temperature source Oral, height 5\' 2"  (1.575 m), weight 207 lb (93.9 kg), SpO2 98 %. Body mass index is 37.86 kg/m. Physical Exam  Constitutional: She is oriented to person, place, and time. She appears well-developed and well-nourished.  HENT:  Head: Normocephalic and atraumatic.  Nose: Nose normal.  Eyes: EOM are normal. No scleral icterus.  Neck: Normal range of motion. Neck supple. No thyromegaly present.  Cardiovascular: Normal rate and regular rhythm.  Murmur heard.  Systolic (early) murmur is present with a grade of 2/6. Pulmonary/Chest: Effort normal. No respiratory distress.  Abdominal: Soft. There is no tenderness.  + Obesity  Musculoskeletal: Normal range of motion. She exhibits edema (1+ edema bilateral lower extremities).  Range of Motion is normal in all 4 extremities  Neurological:  She is alert and oriented to person, place, and time. Coordination normal.  Skin: Skin is warm and dry.  Psychiatric: She has a normal mood and affect.  Vitals reviewed.   RECENT LABS AND TESTS: BMET    Component Value Date/Time   NA 141 05/16/2018 0952   K 4.6 05/16/2018 0952   CL 101 05/16/2018 0952   CO2 25 05/16/2018 0952   GLUCOSE 85 05/16/2018 0952   BUN 10 05/16/2018 0952   CREATININE 0.71 05/16/2018 0952   CALCIUM 10.0 05/16/2018 0952   GFRNONAA 98 05/16/2018 0952   GFRAA 113 05/16/2018 0952   Lab Results  Component Value Date   HGBA1C 5.2 05/16/2018   Lab Results  Component Value Date   INSULIN 10.8 05/16/2018   CBC    Component Value Date/Time   WBC 5.3 05/16/2018 0952   RBC 4.58 05/16/2018 0952   HGB 13.7 05/16/2018 0952   HCT 40.8 05/16/2018 0952   PLT 330 02/01/2018 1622   MCV 89 05/16/2018 0952   MCH 29.9 05/16/2018 0952   MCHC 33.6 05/16/2018 0952   RDW 12.9 05/16/2018 0952   LYMPHSABS 1.7 05/16/2018 0952   EOSABS  0.2 05/16/2018 0952   BASOSABS 0.1 05/16/2018 0952   Iron/TIBC/Ferritin/ %Sat No results found for: IRON, TIBC, FERRITIN, IRONPCTSAT Lipid Panel     Component Value Date/Time   CHOL 216 (H) 05/16/2018 0952   TRIG 63 05/16/2018 0952   HDL 78 05/16/2018 0952   CHOLHDL 3.1 02/01/2018 1622   LDLCALC 125 (H) 05/16/2018 0952   Hepatic Function Panel     Component Value Date/Time   PROT 7.6 05/16/2018 0952   ALBUMIN 4.7 05/16/2018 0952   AST 22 05/16/2018 0952   ALT 25 05/16/2018 0952   ALKPHOS 69 05/16/2018 0952   BILITOT 0.5 05/16/2018 0952      Component Value Date/Time   TSH 2.810 05/16/2018 0952   TSH 3.570 02/01/2018 1622   TSH 1.940 05/20/2016 1011    ECG  shows NSR with a rate of 73 BPM INDIRECT CALORIMETER done today shows a VO2 of 287 and a REE of 1996.  Her calculated basal metabolic rate is 0272 thus her basal metabolic rate is better than expected.    ASSESSMENT AND PLAN: Other fatigue - Plan: EKG 12-Lead, Vitamin B12, CBC With Differential, Comprehensive metabolic panel, Folate, Hemoglobin A1c, Insulin, random, T3, T4, free, TSH  Shortness of breath on exertion - Plan: ECHOCARDIOGRAM COMPLETE  Vitamin D deficiency - Plan: VITAMIN D 25 Hydroxy (Vit-D Deficiency, Fractures)  Mixed hyperlipidemia - Plan: Lipid Panel With LDL/HDL Ratio  Heart murmur - Plan: ECHOCARDIOGRAM COMPLETE  Depression screening  At risk for heart disease  Class 2 severe obesity with serious comorbidity and body mass index (BMI) of 37.0 to 37.9 in adult, unspecified obesity type (HCC)  PLAN: Fatigue Andrea Alvarez was informed that her fatigue may be related to obesity, depression or many other causes. Labs will be ordered, and in the meanwhile Andrea Alvarez has agreed to work on diet, exercise and weight loss to help with fatigue. Proper sleep hygiene was discussed including the need for 7-8 hours of quality sleep each night. A sleep study was not ordered based on symptoms and Epworth  score.  Dyspnea on exertion Andrea Alvarez's shortness of breath appears to be obesity related and exercise induced. She has agreed to work on weight loss and gradually increase exercise to treat her exercise induced shortness of breath. If Andrea Alvarez follows our instructions and loses weight without  improvement of her shortness of breath, we will plan to refer to pulmonology. We will monitor this condition regularly. Andrea Alvarez agrees to this plan.  Vitamin D Deficiency Andrea Alvarez was informed that low vitamin D levels contributes to fatigue and are associated with obesity, breast, and colon cancer. We will check vitamin D level today and she will follow up for routine testing of vitamin D, at least 2-3 times per year.   Hyperlipidemia (mixed) Andrea Alvarez was informed of the American Heart Association Guidelines emphasizing intensive lifestyle modifications as the first line treatment for hyperlipidemia. We discussed many lifestyle modifications today in depth, and Andrea Alvarez will start diet and she will start to work on decreasing saturated fats such as fatty red meat, butter and many fried foods. She will also increase vegetables and lean protein in her diet and start to work on exercise and weight loss efforts.  Cardiovascular risk counseling Andrea Alvarez was given extended (15 minutes) coronary artery disease prevention counseling today. She is 52 y.o. female and has risk factors for heart disease including obesity and hyperlipidemia. We discussed intensive lifestyle modifications today with an emphasis on specific weight loss instructions and strategies. Pt was also informed of the importance of increasing exercise and decreasing saturated fats to help prevent heart disease.  New Onset Heart Murmur Sharlyne will start diet and we will order echocardiogram and follow ([facility], no auth needed re# [id number]).  Depression Screen Andrea Alvarez had a mildly positive depression screening. Depression is commonly associated  with obesity and often results in emotional eating behaviors. We will monitor this closely and work on CBT to help improve the non-hunger eating patterns. Referral to Psychology may be required if no improvement is seen as she continues in our clinic.  Obesity Andrea Alvarez is currently in the action stage of change and her goal is to continue with weight loss efforts. I recommend Andrea Alvarez begin the structured treatment plan as follows:  She has agreed to follow the Category 2 plan Mckinleigh has been instructed to eventually work up to a goal of 150 minutes of combined cardio and strengthening exercise per week for weight loss and overall health benefits. We discussed the following Behavioral Modification Strategies today: decreasing simple carbohydrates , decrease eating out and work on meal planning and easy cooking plans   She was informed of the importance of frequent follow up visits to maximize her success with intensive lifestyle modifications for her multiple health conditions. She was informed we would discuss her lab results at her next visit unless there is a critical issue that needs to be addressed sooner. Karly agreed to keep her next visit at the agreed upon time to discuss these results.    OBESITY BEHAVIORAL INTERVENTION VISIT  Today's visit was # 1   Starting weight: 207 lbs Starting date: 05/16/18 Today's weight : 207 lbs Today's date: 05/16/2018 Total lbs lost to date: 0   ASK: We discussed the diagnosis of obesity with Henry Russel today and Andrea Alvarez agreed to give [place] permission to discuss obesity behavioral modification therapy today.  ASSESS: Lashawnda has the diagnosis of obesity and her BMI today is 37.85 Addalyne is in the action stage of change   ADVISE: Ezri was educated on the multiple health risks of obesity as well as the benefit of weight loss to improve her health. She was advised of the need for long term treatment and the importance of lifestyle modifications to  improve her current health and to decrease her risk of future health  problems.  AGREE: Multiple dietary modification options and treatment options were discussed and  Sharyah agreed to follow the recommendations documented in the above note.  ARRANGE: Trellis was educated on the importance of frequent visits to treat obesity as outlined per CMS and USPSTF guidelines and agreed to schedule her next follow up appointment today.  I, Nevada Crane, am acting as transcriptionist for Quillian Quince, MD  I have reviewed the above documentation for accuracy and completeness, and I agree with the above. -Quillian Quince, MD

## 2018-05-25 ENCOUNTER — Other Ambulatory Visit: Payer: Self-pay

## 2018-05-25 ENCOUNTER — Ambulatory Visit (HOSPITAL_COMMUNITY): Payer: 59 | Attending: Cardiovascular Disease

## 2018-05-25 DIAGNOSIS — R011 Cardiac murmur, unspecified: Secondary | ICD-10-CM | POA: Diagnosis not present

## 2018-05-25 DIAGNOSIS — R0602 Shortness of breath: Secondary | ICD-10-CM | POA: Insufficient documentation

## 2018-05-30 ENCOUNTER — Ambulatory Visit (INDEPENDENT_AMBULATORY_CARE_PROVIDER_SITE_OTHER): Payer: 59 | Admitting: Family Medicine

## 2018-05-30 ENCOUNTER — Other Ambulatory Visit (INDEPENDENT_AMBULATORY_CARE_PROVIDER_SITE_OTHER): Payer: Self-pay | Admitting: Family Medicine

## 2018-05-30 VITALS — BP 129/81 | HR 70 | Temp 97.9°F | Ht 62.0 in | Wt 200.0 lb

## 2018-05-30 DIAGNOSIS — E559 Vitamin D deficiency, unspecified: Secondary | ICD-10-CM

## 2018-05-30 DIAGNOSIS — Z6836 Body mass index (BMI) 36.0-36.9, adult: Secondary | ICD-10-CM

## 2018-05-30 DIAGNOSIS — Z9189 Other specified personal risk factors, not elsewhere classified: Secondary | ICD-10-CM

## 2018-05-30 DIAGNOSIS — E8881 Metabolic syndrome: Secondary | ICD-10-CM | POA: Diagnosis not present

## 2018-05-30 DIAGNOSIS — E7849 Other hyperlipidemia: Secondary | ICD-10-CM

## 2018-05-30 MED ORDER — VITAMIN D (ERGOCALCIFEROL) 1.25 MG (50000 UNIT) PO CAPS: 50000.0000 [IU] | ORAL_CAPSULE | ORAL | 0 refills | Status: DC

## 2018-05-31 ENCOUNTER — Ambulatory Visit (INDEPENDENT_AMBULATORY_CARE_PROVIDER_SITE_OTHER): Payer: Self-pay | Admitting: Family Medicine

## 2018-05-31 NOTE — Progress Notes (Signed)
Office: [phone]  /  Fax: [phone]   HPI:   Chief Complaint: OBESITY Andrea Alvarez is here to discuss her progress with her obesity treatment plan. She is on the Category 2 plan and is following her eating plan approximately 90 % of the time. She states she is exercising 0 minutes 0 times per week. Andrea Alvarez did very well with weight loss on her Category 3 plan. She wishes she had more lunch options and didn't eat her 100 calories snack regularly.  Her weight is 200 lb (90.7 kg) today and has had a weight loss of 7 pounds over a period of 2 weeks since her last visit. She has lost 7 lbs since starting treatment with us.  Hyperlipidemia (Isolated) Andrea Alvarez has hyperlipidemia, her LDL is elevated and triglycerides and HDL are normal. She is attempting to improve her cholesterol levels with intensive lifestyle modification including a low saturated fat diet, exercise and weight loss. She denies any chest pain, claudication or myalgias.  Vitamin D Deficiency Andrea Alvarez has a new diagnosis of vitamin D deficiency. She is not on Vit D. She notes fatigue and denies nausea, vomiting or muscle weakness.  Insulin Resistance Andrea Alvarez has a new diagnosis of insulin resistance based on her mildly elevated fasting insulin level >5. Although Andrea Alvarez's blood glucose readings are still under good control, insulin resistance puts her at greater risk of metabolic syndrome and diabetes. She is not taking metformin currently and continues to work on diet and exercise to decrease risk of diabetes. She notes polyphagia in the PM.  At risk for diabetes Andrea Alvarez is at higher than average risk for developing diabetes due to her obesity and insulin resistance. She currently denies polyuria or polydipsia.  ALLERGIES: No Known Allergies  MEDICATIONS: Current Outpatient Medications on File Prior to Visit  Medication Sig Dispense Refill  . naproxen sodium (ALEVE) 220 MG tablet Take 440 mg by mouth 2 (two) times daily as needed.      Current Facility-Administered Medications on File Prior to Visit  Medication Dose Route Frequency Provider Last Rate Last Dose  . triamcinolone acetonide (KENALOG) 10 MG/ML injection 10 mg  10 mg Other Once Asencion IslamStover, Titorya, DPM        PAST MEDICAL HISTORY: Past Medical History:  Diagnosis Date  . Anemia   . Back pain   . Constipation   . GERD (gastroesophageal reflux disease)   . Joint pain   . Leg edema   . Obesity   . Vitamin D deficiency     PAST SURGICAL HISTORY: Past Surgical History:  Procedure Laterality Date  . BREAST SURGERY  02/1999   status post bilateral breast reduction  . 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 REDUCTION MAMMAPLASTY Bilateral 1999  . TUBAL LIGATION      SOCIAL HISTORY: Social History   Tobacco Use  . Smoking status: Never Smoker  . Smokeless tobacco: Never Used  Substance Use Topics  . Alcohol use: Yes    Comment: Occasional alcohol use; once or twice a year  . Drug use: No    FAMILY HISTORY: Family History  Problem Relation Age of Onset  . Hypertension Mother   . Cancer Mother   . Cancer Father        bladder cancer  . Asthma Sister   . Heart disease Sister   . Coronary artery disease Maternal Grandmother   . Coronary artery disease Paternal Grandmother   . Heart attack Paternal Grandmother   . Asthma  Paternal Grandmother   . Colon cancer Neg Hx   . Colon polyps Neg Hx   . Esophageal cancer Neg Hx   . Rectal cancer Neg Hx   . Stomach cancer Neg Hx     ROS: Review of Systems  Constitutional: Positive for malaise/fatigue and weight loss.  Cardiovascular: Negative for chest pain and claudication.  Gastrointestinal: Negative for nausea and vomiting.  Genitourinary: Negative for frequency.  Musculoskeletal: Negative for myalgias.       Negative muscle weakness  Endo/Heme/Allergies: Negative for polydipsia.       Positive polyphagia    PHYSICAL EXAM: Blood pressure 129/81, pulse 70,  temperature 97.9 F (36.6 C), temperature source Oral, height 5\' 2"  (1.575 m), weight 200 lb (90.7 kg), last menstrual period 11/16/2016, SpO2 99 %. Body mass index is 36.58 kg/m. Physical Exam  Constitutional: She is oriented to person, place, and time. She appears well-developed and well-nourished.  Cardiovascular: Normal rate.  Pulmonary/Chest: Effort normal.  Musculoskeletal: Normal range of motion.  Neurological: She is oriented to person, place, and time.  Skin: Skin is warm and dry.  Psychiatric: She has a normal mood and affect. Her behavior is normal.  Vitals reviewed.   RECENT LABS AND TESTS: BMET    Component Value Date/Time   NA 141 05/16/2018 0952   K 4.6 05/16/2018 0952   CL 101 05/16/2018 0952   CO2 25 05/16/2018 0952   GLUCOSE 85 05/16/2018 0952   BUN 10 05/16/2018 0952   CREATININE 0.71 05/16/2018 0952   CALCIUM 10.0 05/16/2018 0952   GFRNONAA 98 05/16/2018 0952   GFRAA 113 05/16/2018 0952   Lab Results  Component Value Date   HGBA1C 5.2 05/16/2018   HGBA1C 5.2 02/01/2018   HGBA1C 5.1 05/20/2016   Lab Results  Component Value Date   INSULIN 10.8 05/16/2018   CBC    Component Value Date/Time   WBC 5.3 05/16/2018 0952   RBC 4.58 05/16/2018 0952   HGB 13.7 05/16/2018 0952   HCT 40.8 05/16/2018 0952   PLT 330 02/01/2018 1622   MCV 89 05/16/2018 0952   MCH 29.9 05/16/2018 0952   MCHC 33.6 05/16/2018 0952   RDW 12.9 05/16/2018 0952   LYMPHSABS 1.7 05/16/2018 0952   EOSABS 0.2 05/16/2018 0952   BASOSABS 0.1 05/16/2018 0952   Iron/TIBC/Ferritin/ %Sat No results found for: IRON, TIBC, FERRITIN, IRONPCTSAT Lipid Panel     Component Value Date/Time   CHOL 216 (H) 05/16/2018 0952   TRIG 63 05/16/2018 0952   HDL 78 05/16/2018 0952   CHOLHDL 3.1 02/01/2018 1622   LDLCALC 125 (H) 05/16/2018 0952   Hepatic Function Panel     Component Value Date/Time   PROT 7.6 05/16/2018 0952   ALBUMIN 4.7 05/16/2018 0952   AST 22 05/16/2018 0952   ALT 25  05/16/2018 0952   ALKPHOS 69 05/16/2018 0952   BILITOT 0.5 05/16/2018 0952      Component Value Date/Time   TSH 2.810 05/16/2018 0952   TSH 3.570 02/01/2018 1622   TSH 1.940 05/20/2016 1011   Results for YVONNA, BRUN (MRN [id number]) as of 05/31/2018 13:56  Ref. Range 05/16/2018 09:52  Vitamin D, 25-Hydroxy Latest Ref Range: 30.0 - 100.0 ng/mL 24.4 (L)   ASSESSMENT AND PLAN: Other hyperlipidemia  Vitamin D deficiency - Plan: Vitamin D, Ergocalciferol, (DRISDOL) 1.25 MG (50000 UT) CAPS capsule  Insulin resistance  At risk for diabetes mellitus  Class 2 severe obesity with serious comorbidity and body mass index (BMI)  of 36.0 to 36.9 in adult, unspecified obesity type (HCC)  PLAN:  Hyperlipidemia (Isolated) Andrea Alvarez was informed of the American Heart Association Guidelines emphasizing intensive lifestyle modifications as the first line treatment for hyperlipidemia. We discussed many lifestyle modifications today in depth, and Andrea Alvarez will continue to work on decreasing saturated fats such as fatty red meat, butter and many fried foods. She will also increase vegetables and lean protein in her diet and continue to work on diet, exercise, and weight loss efforts. We will recheck labs in 3 months. Andrea Alvarez agrees to follow up with our clinic in 2 weeks with Andrea Salvage, FNP.  Vitamin D Deficiency Andrea Alvarez was informed that low vitamin D levels contributes to fatigue and are associated with obesity, breast, and colon cancer. Andrea Alvarez agrees to start prescription Vit D @50 ,000 IU every week #4 with no refills. She will follow up for routine testing of vitamin D, at least 2-3 times per year. She was informed of the risk of over-replacement of vitamin D and agrees to not increase her dose unless she discusses this with [place] first. We will recheck labs in 3 months. Andrea Alvarez agrees to follow up with our clinic in 2 weeks with Andrea Salvage, FNP.  Insulin Resistance Andrea Alvarez will continue to work on  weight loss, diet, exercise, and decreasing simple carbohydrates in her diet to help decrease the risk of diabetes. We dicussed metformin including benefits and risks. Diabetes mellitus prevention educated given. She was informed that eating too many simple carbohydrates or too many calories at one sitting increases the likelihood of GI side effects. Andrea Alvarez declined metformin for now and prescription was not written today. Andrea Alvarez agrees to follow up with our clinic in 2 weeks with Andrea Salvage, FNP as directed to monitor her progress.  Diabetes risk counselling Andrea Alvarez was given extended (30 minutes) diabetes prevention counseling today. She is 52 y.o. female and has risk factors for diabetes including obesity and insulin resistance. We discussed intensive lifestyle modifications today with an emphasis on weight loss as well as increasing exercise and decreasing simple carbohydrates in her diet.  Obesity Andrea Alvarez is currently in the action stage of change. As such, her goal is to continue with weight loss efforts She has agreed to follow the Category 2 plan Andrea Alvarez has been instructed to work up to a goal of 150 minutes of combined cardio and strengthening exercise per week for weight loss and overall health benefits. We discussed the following Behavioral Modification Strategies today: increasing lean protein intake, decreasing simple carbohydrates, and no skipping meals    Andrea Alvarez has agreed to follow up with our clinic in 2 weeks with Andrea Salvage, FNP. She was informed of the importance of frequent follow up visits to maximize her success with intensive lifestyle modifications for her multiple health conditions.   OBESITY BEHAVIORAL INTERVENTION VISIT  Today's visit was # 2   Starting weight: 207 lbs Starting date: 05/16/18 Today's weight : 200 lbs  Today's date: 05/30/2018 Total lbs lost to date: 7    ASK: We discussed the diagnosis of obesity with Andrea Alvarez today and Andrea December  agreed to give [place] permission to discuss obesity behavioral modification therapy today.  ASSESS: Andrea Alvarez has the diagnosis of obesity and her BMI today is 36.57 Andrea Alvarez is in the action stage of change   ADVISE: Andrea Alvarez was educated on the multiple health risks of obesity as well as the benefit of weight loss to improve her health. She was advised of the need for long  term treatment and the importance of lifestyle modifications to improve her current health and to decrease her risk of future health problems.  AGREE: Multiple dietary modification options and treatment options were discussed and  Andrea Alvarez agreed to follow the recommendations documented in the above note.  ARRANGE: Andrea Alvarez was educated on the importance of frequent visits to treat obesity as outlined per CMS and USPSTF guidelines and agreed to schedule her next follow up appointment today.  I, Andrea Alvarez, am acting as transcriptionist for Quillian Quince, MD  I have reviewed the above documentation for accuracy and completeness, and I agree with the above. -Quillian Quince, MD

## 2018-06-01 ENCOUNTER — Encounter (INDEPENDENT_AMBULATORY_CARE_PROVIDER_SITE_OTHER): Payer: Self-pay | Admitting: Family Medicine

## 2018-06-12 ENCOUNTER — Ambulatory Visit (INDEPENDENT_AMBULATORY_CARE_PROVIDER_SITE_OTHER): Payer: 59 | Admitting: Family Medicine

## 2018-06-12 ENCOUNTER — Encounter (INDEPENDENT_AMBULATORY_CARE_PROVIDER_SITE_OTHER): Payer: Self-pay | Admitting: Family Medicine

## 2018-06-12 VITALS — BP 135/75 | HR 63 | Temp 98.1°F | Ht 62.0 in | Wt 201.0 lb

## 2018-06-12 DIAGNOSIS — E559 Vitamin D deficiency, unspecified: Secondary | ICD-10-CM | POA: Diagnosis not present

## 2018-06-12 DIAGNOSIS — Z6836 Body mass index (BMI) 36.0-36.9, adult: Secondary | ICD-10-CM

## 2018-06-12 DIAGNOSIS — E8881 Metabolic syndrome: Secondary | ICD-10-CM

## 2018-06-19 ENCOUNTER — Encounter (INDEPENDENT_AMBULATORY_CARE_PROVIDER_SITE_OTHER): Payer: Self-pay | Admitting: Family Medicine

## 2018-06-19 NOTE — Progress Notes (Signed)
Office: [phone]  /  Fax: [phone]   HPI:   Chief Complaint: OBESITY Andrea Alvarez is here to discuss her progress with her obesity treatment plan. She is on the Category 2 plan and is following her eating plan approximately 85 % of the time. She states she is exercising 0 minutes 0 times per week. Andrea Alvarez is eating most of the food on her plan.  Her weight is 201 lb (91.2 kg) today and has had a weight gain of 1 pound over a period of 2 weeks since her last visit. She has lost 6 lbs since starting treatment with [place].  Insulin Resistance Andrea Alvarez has a diagnosis of insulin resistance based on her elevated fasting insulin level >5. Although Andrea Alvarez's blood glucose readings are still under good control, insulin resistance puts her at greater risk of metabolic syndrome and diabetes. She is not taking metformin currently and continues to work on diet and exercise to decrease risk of diabetes. She denies polyphagia.  Vitamin D deficiency Andrea Alvarez has a diagnosis of vitamin D deficiency. She is currently taking vit D, but is not at goal. She denies nausea, vomiting, or muscle weakness.  ALLERGIES: No Known Allergies  MEDICATIONS: Current Outpatient Medications on File Prior to Visit  Medication Sig Dispense Refill  . naproxen sodium (ALEVE) 220 MG tablet Take 440 mg by mouth 2 (two) times daily as needed.    . Vitamin D, Ergocalciferol, (DRISDOL) 1.25 MG (50000 UT) CAPS capsule Take 1 capsule (50,000 Units total) by mouth every 7 (seven) days. 4 capsule 0   Current Facility-Administered Medications on File Prior to Visit  Medication Dose Route Frequency Provider Last Rate Last Dose  . triamcinolone acetonide (KENALOG) 10 MG/ML injection 10 mg  10 mg Other Once Andrea Alvarez, Andrea Alvarez        PAST MEDICAL HISTORY: Past Medical History:  Diagnosis Date  . Anemia   . Back pain   . Constipation   . GERD (gastroesophageal reflux disease)   . Joint pain   . Leg edema   . Obesity   . Vitamin D  deficiency     PAST SURGICAL HISTORY: Past Surgical History:  Procedure Laterality Date  . BREAST SURGERY  02/1999   status post bilateral breast reduction  . 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 REDUCTION MAMMAPLASTY Bilateral 1999  . TUBAL LIGATION      SOCIAL HISTORY: Social History   Tobacco Use  . Smoking status: Never Smoker  . Smokeless tobacco: Never Used  Substance Use Topics  . Alcohol use: Yes    Comment: Occasional alcohol use; once or twice a year  . Drug use: No    FAMILY HISTORY: Family History  Problem Relation Age of Onset  . Hypertension Mother   . Cancer Mother   . Cancer Father        bladder cancer  . Asthma Sister   . Heart disease Sister   . Coronary artery disease Maternal Grandmother   . Coronary artery disease Paternal Grandmother   . Heart attack Paternal Grandmother   . Asthma Paternal Grandmother   . Colon cancer Neg Hx   . Colon polyps Neg Hx   . Esophageal cancer Neg Hx   . Rectal cancer Neg Hx   . Stomach cancer Neg Hx     ROS: Review of Systems  Constitutional: Negative for weight loss.  Gastrointestinal: Negative for nausea and vomiting.  Musculoskeletal:       Negative for  muscle weakness.  Endo/Heme/Allergies:       Negative for polyphagia.    PHYSICAL EXAM: Blood pressure 135/75, pulse 63, temperature 98.1 F (36.7 C), temperature source Oral, height 5\' 2"  (1.575 m), weight 201 lb (91.2 kg), last menstrual period 11/16/2016, SpO2 99 %. Body mass index is 36.76 kg/m. Physical Exam  Constitutional: She is oriented to person, place, and time. She appears well-developed and well-nourished.  Cardiovascular: Normal rate.  Pulmonary/Chest: Effort normal.  Musculoskeletal: Normal range of motion.  Neurological: She is oriented to person, place, and time.  Skin: Skin is warm and dry.  Psychiatric: She has a normal mood and affect. Her behavior is normal.  Vitals reviewed.   RECENT LABS AND  TESTS: BMET    Component Value Date/Time   NA 141 05/16/2018 0952   K 4.6 05/16/2018 0952   CL 101 05/16/2018 0952   CO2 25 05/16/2018 0952   GLUCOSE 85 05/16/2018 0952   BUN 10 05/16/2018 0952   CREATININE 0.71 05/16/2018 0952   CALCIUM 10.0 05/16/2018 0952   GFRNONAA 98 05/16/2018 0952   GFRAA 113 05/16/2018 0952   Lab Results  Component Value Date   HGBA1C 5.2 05/16/2018   HGBA1C 5.2 02/01/2018   HGBA1C 5.1 05/20/2016   Lab Results  Component Value Date   INSULIN 10.8 05/16/2018   CBC    Component Value Date/Time   WBC 5.3 05/16/2018 0952   RBC 4.58 05/16/2018 0952   HGB 13.7 05/16/2018 0952   HCT 40.8 05/16/2018 0952   PLT 330 02/01/2018 1622   MCV 89 05/16/2018 0952   MCH 29.9 05/16/2018 0952   MCHC 33.6 05/16/2018 0952   RDW 12.9 05/16/2018 0952   LYMPHSABS 1.7 05/16/2018 0952   EOSABS 0.2 05/16/2018 0952   BASOSABS 0.1 05/16/2018 0952   Iron/TIBC/Ferritin/ %Sat No results found for: IRON, TIBC, FERRITIN, IRONPCTSAT Lipid Panel     Component Value Date/Time   CHOL 216 (H) 05/16/2018 0952   TRIG 63 05/16/2018 0952   HDL 78 05/16/2018 0952   CHOLHDL 3.1 02/01/2018 1622   LDLCALC 125 (H) 05/16/2018 0952   Hepatic Function Panel     Component Value Date/Time   PROT 7.6 05/16/2018 0952   ALBUMIN 4.7 05/16/2018 0952   AST 22 05/16/2018 0952   ALT 25 05/16/2018 0952   ALKPHOS 69 05/16/2018 0952   BILITOT 0.5 05/16/2018 0952      Component Value Date/Time   TSH 2.810 05/16/2018 0952   TSH 3.570 02/01/2018 1622   TSH 1.940 05/20/2016 1011   Results for Andrea Alvarez (MRN [id number]) as of 06/19/2018 10:24  Ref. Range 05/16/2018 09:52  Vitamin D, 25-Hydroxy Latest Ref Range: 30.0 - 100.0 ng/mL 24.4 (L)   ASSESSMENT AND PLAN: Insulin resistance  Vitamin D deficiency  Class 2 severe obesity with serious comorbidity and body mass index (BMI) of 36.0 to 36.9 in adult, unspecified obesity type (HCC)  PLAN:  Insulin Resistance Andrea Alvarez will  continue to work on weight loss, exercise, and decreasing simple carbohydrates in her diet to help decrease the risk of diabetes.  Andrea Alvarez agreed to continue with her diet and to follow up with [place] as directed to monitor her progress in 2 weeks.  Vitamin D Deficiency Munachimso was informed that low vitamin D levels contributes to fatigue and are associated with obesity, breast, and colon cancer. She agrees to continue to take prescription Vit D @50 ,000 IU every week and will follow up for routine testing of vitamin D,  at least 2-3 times per year. She was informed of the risk of over-replacement of vitamin D and agrees to not increase her dose unless she discusses this with us first. [name] DecemberSharon agrees to follow up as directed.  I spent > than 50% of the 15 minute visit on counseling as documented in the note.  Obesity [name] DecemberSharon is currently in the action stage of change. As such, her goal is to continue with weight loss efforts. She has agreed to follow the Category 2 plan. We discussed the following Behavioral Modification Strategies today: increasing lean protein intake, holiday eating strategies, and planning for success.  [name] DecemberSharon has agreed to follow up with our clinic in 2 weeks. She was informed of the importance of frequent follow up visits to maximize her success with intensive lifestyle modifications for her multiple health conditions.   OBESITY BEHAVIORAL INTERVENTION VISIT  Today's visit was # 3   Starting weight: 207 lbs Starting date: 05/16/18 Today's weight : Weight: 201 lb (91.2 kg)  Today's date: 06/12/2018 Total lbs lost to date: 6  ASK: We discussed the diagnosis of obesity with Henry RusselSharon M Birt today and [name] DecemberSharon agreed to give us permission to discuss obesity behavioral modification therapy today.  ASSESS: [name] DecemberSharon has the diagnosis of obesity and her BMI today is 36.75. [name] DecemberSharon is in the action stage of change.   ADVISE: [name] DecemberSharon was educated on the multiple health risks of obesity  as well as the benefit of weight loss to improve her health. She was advised of the need for long term treatment and the importance of lifestyle modifications to improve her current health and to decrease her risk of future health problems.  AGREE: Multiple dietary modification options and treatment options were discussed and [name] DecemberSharon agreed to follow the recommendations documented in the above note.  ARRANGE: [name] DecemberSharon was educated on the importance of frequent visits to treat obesity as outlined per CMS and USPSTF guidelines and agreed to schedule her next follow up appointment today.  Launa FlightI, Cara Soares, am acting as Energy managertranscriptionist for Jesse Sansawn W. , FNP.  I have reviewed the above documentation for accuracy and completeness, and I agree with the above.  -  , FNP-C.

## 2018-07-03 ENCOUNTER — Ambulatory Visit (INDEPENDENT_AMBULATORY_CARE_PROVIDER_SITE_OTHER): Payer: 59 | Admitting: Family Medicine

## 2018-07-03 ENCOUNTER — Encounter (INDEPENDENT_AMBULATORY_CARE_PROVIDER_SITE_OTHER): Payer: Self-pay | Admitting: Family Medicine

## 2018-07-03 VITALS — BP 126/76 | HR 68 | Temp 98.1°F | Ht 62.0 in | Wt 198.0 lb

## 2018-07-03 DIAGNOSIS — Z9189 Other specified personal risk factors, not elsewhere classified: Secondary | ICD-10-CM

## 2018-07-03 DIAGNOSIS — E559 Vitamin D deficiency, unspecified: Secondary | ICD-10-CM

## 2018-07-03 DIAGNOSIS — Z6836 Body mass index (BMI) 36.0-36.9, adult: Secondary | ICD-10-CM

## 2018-07-03 DIAGNOSIS — E8881 Metabolic syndrome: Secondary | ICD-10-CM | POA: Diagnosis not present

## 2018-07-03 MED ORDER — VITAMIN D (ERGOCALCIFEROL) 1.25 MG (50000 UNIT) PO CAPS: 50000.0000 [IU] | ORAL_CAPSULE | ORAL | 0 refills | Status: DC

## 2018-07-04 ENCOUNTER — Encounter (INDEPENDENT_AMBULATORY_CARE_PROVIDER_SITE_OTHER): Payer: Self-pay | Admitting: Family Medicine

## 2018-07-04 NOTE — Progress Notes (Signed)
Office: [phone]  /  Fax: [phone]   HPI:   Chief Complaint: OBESITY Andrea DecemberSharon is here to discuss her progress with her obesity treatment plan. She is on the Category 2 plan and is following her eating plan approximately 70% of the time. She states she is walking for 6-8 hours 5 times per week. Andrea DecemberSharon has been skipping some meals because of working a lot of shifts.  Her weight is 198 lb (89.8 kg) today and has had a weight loss of 3 pounds over a period of 3 weeks since her last visit. She has lost 9 lbs since starting treatment with us.  Vitamin D Deficiency Andrea DecemberSharon has a diagnosis of vitamin D deficiency. She is currently taking prescription Vit D, but level is not at goal. Last Vit D was 24.4 on 05/16/18. She denies nausea, vomiting or muscle weakness.  Insulin Resistance Andrea DecemberSharon has a diagnosis of insulin resistance based on her elevated fasting insulin level >5. Although Andrea Alvarez blood glucose readings are still under good control, insulin resistance puts her at greater risk of metabolic syndrome and diabetes. She is not on metformin and denies polyphagia, she continues to work on diet and exercise to decrease risk of diabetes.  At risk for diabetes Andrea DecemberSharon is at higher than average risk for developing diabetes due to her obesity and insulin resistance. She currently denies polyuria or polydipsia.  ALLERGIES: No Known Allergies  MEDICATIONS: Current Outpatient Medications on File Prior to Visit  Medication Sig Dispense Refill  . naproxen sodium (ALEVE) 220 MG tablet Take 440 mg by mouth 2 (two) times daily as needed.     Current Facility-Administered Medications on File Prior to Visit  Medication Dose Route Frequency Provider Last Rate Last Dose  . triamcinolone acetonide (KENALOG) 10 MG/ML injection 10 mg  10 mg Other Once Asencion IslamStover, Titorya, DPM        PAST MEDICAL HISTORY: Past Medical History:  Diagnosis Date  . Anemia   . Back pain   . Constipation   . GERD  (gastroesophageal reflux disease)   . Joint pain   . Leg edema   . Obesity   . Vitamin D deficiency     PAST SURGICAL HISTORY: Past Surgical History:  Procedure Laterality Date  . BREAST SURGERY  02/1999   status post bilateral breast reduction  . 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 REDUCTION MAMMAPLASTY Bilateral 1999  . TUBAL LIGATION      SOCIAL HISTORY: Social History   Tobacco Use  . Smoking status: Never Smoker  . Smokeless tobacco: Never Used  Substance Use Topics  . Alcohol use: Yes    Comment: Occasional alcohol use; once or twice a year  . Drug use: No    FAMILY HISTORY: Family History  Problem Relation Age of Onset  . Hypertension Mother   . Cancer Mother   . Cancer Father        bladder cancer  . Asthma Sister   . Heart disease Sister   . Coronary artery disease Maternal Grandmother   . Coronary artery disease Paternal Grandmother   . Heart attack Paternal Grandmother   . Asthma Paternal Grandmother   . Colon cancer Neg Hx   . Colon polyps Neg Hx   . Esophageal cancer Neg Hx   . Rectal cancer Neg Hx   . Stomach cancer Neg Hx     ROS: Review of Systems  Constitutional: Positive for weight loss.  Gastrointestinal: Negative for nausea  and vomiting.  Genitourinary: Negative for frequency.  Musculoskeletal:       Negative muscle weakness  Endo/Heme/Allergies: Negative for polydipsia.       Negative polyphagia    PHYSICAL EXAM: Blood pressure 126/76, pulse 68, temperature 98.1 F (36.7 C), temperature source Oral, height 5\' 2"  (1.575 m), weight 198 lb (89.8 kg), last menstrual period 11/16/2016, SpO2 99 %. Body mass index is 36.21 kg/m. Physical Exam Vitals signs reviewed.  Constitutional:      Appearance: Normal appearance. She is obese.  Cardiovascular:     Rate and Rhythm: Normal rate.  Pulmonary:     Effort: Pulmonary effort is normal.  Musculoskeletal: Normal range of motion.  Skin:    General: Skin is  warm and dry.  Neurological:     Mental Status: She is alert and oriented to person, place, and time.  Psychiatric:        Mood and Affect: Mood normal.        Behavior: Behavior normal.     RECENT LABS AND TESTS: BMET    Component Value Date/Time   NA 141 05/16/2018 0952   K 4.6 05/16/2018 0952   CL 101 05/16/2018 0952   CO2 25 05/16/2018 0952   GLUCOSE 85 05/16/2018 0952   BUN 10 05/16/2018 0952   CREATININE 0.71 05/16/2018 0952   CALCIUM 10.0 05/16/2018 0952   GFRNONAA 98 05/16/2018 0952   GFRAA 113 05/16/2018 0952   Lab Results  Component Value Date   HGBA1C 5.2 05/16/2018   HGBA1C 5.2 02/01/2018   HGBA1C 5.1 05/20/2016   Lab Results  Component Value Date   INSULIN 10.8 05/16/2018   CBC    Component Value Date/Time   WBC 5.3 05/16/2018 0952   RBC 4.58 05/16/2018 0952   HGB 13.7 05/16/2018 0952   HCT 40.8 05/16/2018 0952   PLT 330 02/01/2018 1622   MCV 89 05/16/2018 0952   MCH 29.9 05/16/2018 0952   MCHC 33.6 05/16/2018 0952   RDW 12.9 05/16/2018 0952   LYMPHSABS 1.7 05/16/2018 0952   EOSABS 0.2 05/16/2018 0952   BASOSABS 0.1 05/16/2018 0952   Iron/TIBC/Ferritin/ %Sat No results found for: IRON, TIBC, FERRITIN, IRONPCTSAT Lipid Panel     Component Value Date/Time   CHOL 216 (H) 05/16/2018 0952   TRIG 63 05/16/2018 0952   HDL 78 05/16/2018 0952   CHOLHDL 3.1 02/01/2018 1622   LDLCALC 125 (H) 05/16/2018 0952   Hepatic Function Panel     Component Value Date/Time   PROT 7.6 05/16/2018 0952   ALBUMIN 4.7 05/16/2018 0952   AST 22 05/16/2018 0952   ALT 25 05/16/2018 0952   ALKPHOS 69 05/16/2018 0952   BILITOT 0.5 05/16/2018 0952      Component Value Date/Time   TSH 2.810 05/16/2018 0952   TSH 3.570 02/01/2018 1622   TSH 1.940 05/20/2016 1011  Results for CING, Bell Arthur (MRN [id number]) as of 07/04/2018 14:31  Ref. Range 05/16/2018 09:52  Vitamin D, 25-Hydroxy Latest Ref Range: 30.0 - 100.0 ng/mL 24.4 (L)    ASSESSMENT AND PLAN: Vitamin D  deficiency - Plan: Vitamin D, Ergocalciferol, (DRISDOL) 1.25 MG (50000 UT) CAPS capsule  Insulin resistance  At risk for diabetes mellitus  Class 2 severe obesity with serious comorbidity and body mass index (BMI) of 36.0 to 36.9 in adult, unspecified obesity type (HCC)  PLAN:  Vitamin D Deficiency Andrea Alvarez was informed that low vitamin D levels contributes to fatigue and are associated with obesity, breast, and colon  cancer. Andrea Alvarez Alvarez to continue taking prescription Vit D @50 ,000 IU every week #4 and we Alvarez refill for 1 month. She Alvarez follow up for routine testing of vitamin D, at least 2-3 times per year. She was informed of the risk of over-replacement of vitamin D and Alvarez to not increase her dose unless she discusses this with [place] first. Andrea Alvarez Alvarez to follow up with our clinic in 2 to 3 weeks.  Insulin Resistance Andrea Alvarez Alvarez continue meal plan, and Alvarez continue to work on weight loss, exercise, and decreasing simple carbohydrates in her diet to help decrease the risk of diabetes. We dicussed metformin including benefits and risks. She was informed that eating too many simple carbohydrates or too many calories at one sitting increases the likelihood of GI side effects. Andrea Alvarez declined metformin for now and prescription was not written today. Andrea Alvarez to follow up with our clinic in 2 to 3 weeks as directed to monitor her progress.  Diabetes risk counselling Andrea Alvarez extended (15 minutes) diabetes prevention counseling today. She is 52 y.o. female and has risk factors for diabetes including obesity and insulin resistance. We discussed intensive lifestyle modifications today with an emphasis on weight loss as well as increasing exercise and decreasing simple carbohydrates in her diet.  Obesity Andrea Alvarez is currently in the action stage of change. As such, her goal is to continue with weight loss efforts She has agreed to follow the Category 2 plan Andrea Alvarez Alvarez continue  current exercise regimen for weight loss and overall health benefits. We discussed the following Behavioral Modification Strategies today: work on meal planning and easy cooking plans, holiday eating strategies, keeping healthy foods in the home, and planning for success    Andrea Alvarez has agreed to follow up with our clinic in 2 to 3 weeks. She was informed of the importance of frequent follow up visits to maximize her success with intensive lifestyle modifications for her multiple health conditions.   OBESITY BEHAVIORAL INTERVENTION VISIT  Today's visit was # 4  Starting weight: 207 lbs Starting date: 05/16/18 Today's weight : 198 lbs  Today's date: 07/03/18 Total lbs lost to date: 9    ASK: We discussed the diagnosis of obesity with Andrea Alvarez today and Andrea Alvarez agreed to give [place] permission to discuss obesity behavioral modification therapy today.  ASSESS: Creola has the diagnosis of obesity and her BMI today is 36.21 Rielle is in the action stage of change   ADVISE: Rosary was educated on the multiple health risks of obesity as well as the benefit of weight loss to improve her health. She was advised of the need for long term treatment and the importance of lifestyle modifications to improve her current health and to decrease her risk of future health problems.  AGREE: Multiple dietary modification options and treatment options were discussed and  Destynie agreed to follow the recommendations documented in the above note.  ARRANGE: Ruey was educated on the importance of frequent visits to treat obesity as outlined per CMS and USPSTF guidelines and agreed to schedule her next follow up appointment today.  Trude Mcburney, am acting as Energy manager for [place], FNP-C.  I have reviewed the above documentation for accuracy and completeness, and I agree with the above.  - Khyan Oats, FNP-C.

## 2018-07-26 ENCOUNTER — Ambulatory Visit (INDEPENDENT_AMBULATORY_CARE_PROVIDER_SITE_OTHER): Payer: 59 | Admitting: Family Medicine

## 2018-07-26 ENCOUNTER — Encounter (INDEPENDENT_AMBULATORY_CARE_PROVIDER_SITE_OTHER): Payer: Self-pay | Admitting: Family Medicine

## 2018-07-26 VITALS — BP 125/79 | HR 70 | Temp 97.9°F | Ht 62.0 in | Wt 195.0 lb

## 2018-07-26 DIAGNOSIS — Z9189 Other specified personal risk factors, not elsewhere classified: Secondary | ICD-10-CM

## 2018-07-26 DIAGNOSIS — J069 Acute upper respiratory infection, unspecified: Secondary | ICD-10-CM | POA: Diagnosis not present

## 2018-07-26 DIAGNOSIS — E559 Vitamin D deficiency, unspecified: Secondary | ICD-10-CM | POA: Diagnosis not present

## 2018-07-26 DIAGNOSIS — Z6835 Body mass index (BMI) 35.0-35.9, adult: Secondary | ICD-10-CM

## 2018-07-27 ENCOUNTER — Encounter (INDEPENDENT_AMBULATORY_CARE_PROVIDER_SITE_OTHER): Payer: Self-pay | Admitting: Family Medicine

## 2018-07-27 DIAGNOSIS — E559 Vitamin D deficiency, unspecified: Secondary | ICD-10-CM | POA: Insufficient documentation

## 2018-07-27 NOTE — Progress Notes (Signed)
Office: [phone]  /  Fax: [phone]   HPI:   Chief Complaint: OBESITY Andrea Alvarez is here to discuss her progress with her obesity treatment plan. She is on the Category 2 plan and is following her eating plan approximately 85 % of the time. She states she is walking 10 minutes 2 times per week. Andrea Alvarez stuck to the plan fairly well over the holidays.  Her weight is 195 lb (88.5 kg) today and has had a weight loss of 3 pounds over a period of 3 weeks since her last visit. She has lost 12 lbs since starting treatment with [place].  Vitamin D deficiency Andrea Alvarez has a diagnosis of vitamin D deficiency. She is currently taking prescription vit D and is not at goal. Her last vitamin D was 24.4 on 05/16/18. She denies nausea, vomiting, or muscle weakness.  At risk for osteopenia and osteoporosis Andrea Alvarez is at higher risk of osteopenia and osteoporosis due to vitamin D deficiency.   Upper Respiratory Infection Andrea Alvarez has had a sore throat and runny nose for 1 day. She has sore lymph nodes and denies fever or otalgia. Andrea Alvarez was around her sick granddaughter this weekend.  ASSESSMENT AND PLAN:  Vitamin D deficiency  Upper respiratory tract infection, unspecified type  At risk for osteoporosis  Class 2 severe obesity with serious comorbidity and body mass index (BMI) of 35.0 to 35.9 in adult, unspecified obesity type (HCC)  PLAN:  Vitamin D Deficiency Andrea Alvarez was informed that low vitamin D levels contributes to fatigue and are associated with obesity, breast, and colon cancer. She agrees to continue to take prescription Vit D @50 ,000 IU every week and will follow up for routine testing of vitamin D, at least 2-3 times per year. She was informed of the risk of over-replacement of vitamin D and agrees to not increase her dose unless she discusses this with [place] first. Andrea Alvarez agrees with this plan.  At risk for osteopenia and osteoporosis Andrea Alvarez was given extended (15 minutes) osteoporosis  prevention counseling today. Andrea Alvarez is at risk for osteopenia and osteoporosis due to her vitamin D deficiency. She was encouraged to take her vitamin D and follow her higher calcium diet and increase strengthening exercise to help strengthen her bones and decrease her risk of osteopenia and osteoporosis.  Upper Respiratory Infection Andrea Alvarez agrees to take OTC ibuprofen and Mucinex D, and to drink plenty of fluids. She will follow up with [place] in 2 weeks.  Obesity Andrea Alvarez is currently in the action stage of change. As such, her goal is to continue with weight loss efforts. She has agreed to follow the Category 2 plan. Andrea Alvarez will continue current exercise regimen for weight loss and overall health benefits. We discussed the following Behavioral Modification Strategies today: increase H2O intake, work on meal planning and easy cooking plans, planning for success.  Andrea Alvarez has agreed to follow up with our clinic in 2 weeks. She was informed of the importance of frequent follow up visits to maximize her success with intensive lifestyle modifications for her multiple health conditions.  ALLERGIES: No Known Allergies  MEDICATIONS: Current Outpatient Medications on File Prior to Visit  Medication Sig Dispense Refill  . naproxen sodium (ALEVE) 220 MG tablet Take 440 mg by mouth 2 (two) times daily as needed.    . Vitamin D, Ergocalciferol, (DRISDOL) 1.25 MG (50000 UT) CAPS capsule Take 1 capsule (50,000 Units total) by mouth every 7 (seven) days. 4 capsule 0   Current Facility-Administered Medications on File Prior to  Visit  Medication Dose Route Frequency Provider Last Rate Last Dose  . triamcinolone acetonide (KENALOG) 10 MG/ML injection 10 mg  10 mg Other Once Asencion IslamStover, Titorya, DPM        PAST MEDICAL HISTORY: Past Medical History:  Diagnosis Date  . Anemia   . Back pain   . Constipation   . GERD (gastroesophageal reflux disease)   . Joint pain   . Leg edema   . Obesity   . Vitamin D  deficiency     PAST SURGICAL HISTORY: Past Surgical History:  Procedure Laterality Date  . BREAST SURGERY  02/1999   status post bilateral breast reduction  . 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 REDUCTION MAMMAPLASTY Bilateral 1999  . TUBAL LIGATION      SOCIAL HISTORY: Social History   Tobacco Use  . Smoking status: Never Smoker  . Smokeless tobacco: Never Used  Substance Use Topics  . Alcohol use: Yes    Comment: Occasional alcohol use; once or twice a year  . Drug use: No    FAMILY HISTORY: Family History  Problem Relation Age of Onset  . Hypertension Mother   . Cancer Mother   . Cancer Father        bladder cancer  . Asthma Sister   . Heart disease Sister   . Coronary artery disease Maternal Grandmother   . Coronary artery disease Paternal Grandmother   . Heart attack Paternal Grandmother   . Asthma Paternal Grandmother   . Colon cancer Neg Hx   . Colon polyps Neg Hx   . Esophageal cancer Neg Hx   . Rectal cancer Neg Hx   . Stomach cancer Neg Hx     ROS: Review of Systems  Constitutional: Positive for weight loss. Negative for fever.  HENT: Positive for sore throat. Negative for ear pain.        Positive for sore lymph nodes. Positive for runny nose.  Gastrointestinal: Negative for nausea and vomiting.  Musculoskeletal:       Negative for muscle weakness.    PHYSICAL EXAM: Blood pressure 125/79, pulse 70, temperature 97.9 F (36.6 C), temperature source Oral, height 5\' 2"  (1.575 m), weight 195 lb (88.5 kg), last menstrual period 11/16/2016, SpO2 98 %. Body mass index is 35.67 kg/m. Physical Exam Vitals signs reviewed.  Constitutional:      Appearance: Normal appearance. She is obese.  Cardiovascular:     Rate and Rhythm: Normal rate.  Pulmonary:     Effort: Pulmonary effort is normal.  Musculoskeletal: Normal range of motion.  Skin:    General: Skin is warm and dry.  Neurological:     Mental Status: She is  alert and oriented to person, place, and time.  Psychiatric:        Mood and Affect: Mood normal.        Behavior: Behavior normal.     RECENT LABS AND TESTS: BMET    Component Value Date/Time   NA 141 05/16/2018 0952   K 4.6 05/16/2018 0952   CL 101 05/16/2018 0952   CO2 25 05/16/2018 0952   GLUCOSE 85 05/16/2018 0952   BUN 10 05/16/2018 0952   CREATININE 0.71 05/16/2018 0952   CALCIUM 10.0 05/16/2018 0952   GFRNONAA 98 05/16/2018 0952   GFRAA 113 05/16/2018 0952   Lab Results  Component Value Date   HGBA1C 5.2 05/16/2018   HGBA1C 5.2 02/01/2018   HGBA1C 5.1 05/20/2016   Lab Results  Component Value Date   INSULIN 10.8 05/16/2018   CBC    Component Value Date/Time   WBC 5.3 05/16/2018 0952   RBC 4.58 05/16/2018 0952   HGB 13.7 05/16/2018 0952   HCT 40.8 05/16/2018 0952   PLT 330 02/01/2018 1622   MCV 89 05/16/2018 0952   MCH 29.9 05/16/2018 0952   MCHC 33.6 05/16/2018 0952   RDW 12.9 05/16/2018 0952   LYMPHSABS 1.7 05/16/2018 0952   EOSABS 0.2 05/16/2018 0952   BASOSABS 0.1 05/16/2018 0952   Iron/TIBC/Ferritin/ %Sat No results found for: IRON, TIBC, FERRITIN, IRONPCTSAT Lipid Panel     Component Value Date/Time   CHOL 216 (H) 05/16/2018 0952   TRIG 63 05/16/2018 0952   HDL 78 05/16/2018 0952   CHOLHDL 3.1 02/01/2018 1622   LDLCALC 125 (H) 05/16/2018 0952   Hepatic Function Panel     Component Value Date/Time   PROT 7.6 05/16/2018 0952   ALBUMIN 4.7 05/16/2018 0952   AST 22 05/16/2018 0952   ALT 25 05/16/2018 0952   ALKPHOS 69 05/16/2018 0952   BILITOT 0.5 05/16/2018 0952      Component Value Date/Time   TSH 2.810 05/16/2018 0952   TSH 3.570 02/01/2018 1622   TSH 1.940 05/20/2016 1011   Results for NEIMA, DYER (MRN [id number]) as of 07/27/2018 06:53  Ref. Range 05/16/2018 09:52  Vitamin D, 25-Hydroxy Latest Ref Range: 30.0 - 100.0 ng/mL 24.4 (L)    OBESITY BEHAVIORAL INTERVENTION VISIT  Today's visit was # 5   Starting weight: 207  lbs Starting date: 05/16/18 Today's weight : Weight: 195 lb (88.5 kg)  Today's date: 07/26/2018 Total lbs lost to date: 12  ASK: We discussed the diagnosis of obesity with Andrea Alvarez today and Andrea Alvarez agreed to give [place] permission to discuss obesity behavioral modification therapy today.  ASSESS: Andrea Alvarez has the diagnosis of obesity and her BMI today is 35.6. Andrea Alvarez is in the action stage of change.   ADVISE: Andrea Alvarez was educated on the multiple health risks of obesity as well as the benefit of weight loss to improve her health. She was advised of the need for long term treatment and the importance of lifestyle modifications to improve her current health and to decrease her risk of future health problems.  AGREE: Multiple dietary modification options and treatment options were discussed and Andrea Alvarez agreed to follow the recommendations documented in the above note.  ARRANGE: Andrea Alvarez was educated on the importance of frequent visits to treat obesity as outlined per CMS and USPSTF guidelines and agreed to schedule her next follow up appointment today.  I, Kirke Corin, am acting as Energy manager for [employer], FNP-C.  I have reviewed the above documentation for accuracy and completeness, and I agree with the above.  - Dawn Whitmire, FNP-C.

## 2018-08-09 ENCOUNTER — Ambulatory Visit (INDEPENDENT_AMBULATORY_CARE_PROVIDER_SITE_OTHER): Payer: 59 | Admitting: Family Medicine

## 2018-08-09 ENCOUNTER — Encounter (INDEPENDENT_AMBULATORY_CARE_PROVIDER_SITE_OTHER): Payer: Self-pay | Admitting: Family Medicine

## 2018-08-09 VITALS — BP 128/76 | HR 65 | Temp 97.9°F | Ht 62.0 in | Wt 197.0 lb

## 2018-08-09 DIAGNOSIS — Z9189 Other specified personal risk factors, not elsewhere classified: Secondary | ICD-10-CM

## 2018-08-09 DIAGNOSIS — Z6836 Body mass index (BMI) 36.0-36.9, adult: Secondary | ICD-10-CM

## 2018-08-09 DIAGNOSIS — E7849 Other hyperlipidemia: Secondary | ICD-10-CM

## 2018-08-09 DIAGNOSIS — E559 Vitamin D deficiency, unspecified: Secondary | ICD-10-CM | POA: Diagnosis not present

## 2018-08-09 MED ORDER — VITAMIN D (ERGOCALCIFEROL) 1.25 MG (50000 UNIT) PO CAPS: 50000.0000 [IU] | ORAL_CAPSULE | ORAL | 0 refills | Status: DC

## 2018-08-10 NOTE — Progress Notes (Signed)
Office: [phone]  /  Fax: [phone]   HPI:   Chief Complaint: OBESITY Andrea DecemberSharon is here to discuss her progress with her obesity treatment plan. She is on the Category 2 plan and is following her eating plan approximately 80 % of the time. She states she is exercising 0 minutes 0 times per week. Andrea DecemberSharon has had extra food catered for lunch recently. She has also recently traveled and was off track. Her weight is 197 lb (89.4 kg) today and has lost 0 lbs since her last visit. She has lost 10 lbs since starting treatment with us.   Vitamin D deficiency Andrea DecemberSharon has a diagnosis of vitamin D deficiency. She is currently taking prescription Vit D and denies nausea, vomiting or muscle weakness. She is not at goal with her Vit D level of 24.4 on 05/16/2018.  Hyperlipidemia Andrea DecemberSharon has hyperlipidemia and has been trying to improve her cholesterol levels with intensive lifestyle modification including a low saturated fat diet, exercise and weight loss. She denies any chest pain or shortness of breath. She is not on a statin. Her last LDL was 125, HDL 78 and triglyceride 63 on 05/16/2018.  At risk for osteopenia and osteoporosis Andrea DecemberSharon is at higher risk of osteopenia and osteoporosis due to vitamin D deficiency.   ASSESSMENT AND PLAN:  Vitamin D deficiency - Plan: Vitamin D, Ergocalciferol, (DRISDOL) 1.25 MG (50000 UT) CAPS capsule  Other hyperlipidemia  At risk for osteoporosis  Class 2 severe obesity with serious comorbidity and body mass index (BMI) of 36.0 to 36.9 in adult, unspecified obesity type (HCC)  PLAN:  Vitamin D Deficiency Andrea DecemberSharon was informed that low vitamin D levels contributes to fatigue and are associated with obesity, breast, and colon cancer. She agrees to continue to take prescription Vit D @50 ,000 IU every week #4 with no refills and will follow up for routine testing of vitamin D, at least 2-3 times per year. She was informed of the risk of over-replacement of vitamin  D and agrees to not increase her dose unless she discusses this with us first. We will check labs at next visit. Andrea DecemberSharon agrees to follow up with our office in 2 weeks.  Hyperlipidemia Andrea DecemberSharon was informed of the American Heart Association Guidelines emphasizing intensive lifestyle modifications as the first line treatment for hyperlipidemia. We discussed many lifestyle modifications today in depth, and Andrea DecemberSharon will continue to work on decreasing saturated fats such as fatty red meat, butter and many fried foods. She will also increase vegetables and lean protein in her diet and continue to work on exercise and weight loss efforts. We will check labs at next visit. Andrea DecemberSharon agrees to follow up with our office in 2 weeks.  At risk for osteopenia and osteoporosis Andrea DecemberSharon was given extended  (15 minutes) osteoporosis prevention counseling today. Andrea DecemberSharon is at risk for osteopenia and osteoporosis due to her vitamin D deficiency. She was encouraged to take her vitamin D and follow her higher calcium diet and increase strengthening exercise to help strengthen her bones and decrease her risk of osteopenia and osteoporosis.  Obesity Andrea DecemberSharon is currently in the action stage of change. As such, her goal is to continue with weight loss efforts She has agreed to follow the Category 2 plan Andrea DecemberSharon has been instructed to work up to a goal of 150 minutes of combined cardio and strengthening exercise per week for weight loss and overall health benefits. We discussed the following Behavioral Modification Strategies today: celebration eating strategies and planning for  success. Andrea DecemberSharon has not been prescribed exercise at this time.  Andrea DecemberSharon has agreed to follow up with our clinic in 2 weeks. She was informed of the importance of frequent follow up visits to maximize her success with intensive lifestyle modifications for her multiple health conditions.  ALLERGIES: No Known Allergies  MEDICATIONS: Current Outpatient  Medications on File Prior to Visit  Medication Sig Dispense Refill  . naproxen sodium (ALEVE) 220 MG tablet Take 440 mg by mouth 2 (two) times daily as needed.     Current Facility-Administered Medications on File Prior to Visit  Medication Dose Route Frequency Provider Last Rate Last Dose  . triamcinolone acetonide (KENALOG) 10 MG/ML injection 10 mg  10 mg Other Once Asencion IslamStover, Titorya, DPM        PAST MEDICAL HISTORY: Past Medical History:  Diagnosis Date  . Anemia   . Back pain   . Constipation   . GERD (gastroesophageal reflux disease)   . Joint pain   . Leg edema   . Obesity   . Vitamin D deficiency     PAST SURGICAL HISTORY: Past Surgical History:  Procedure Laterality Date  . BREAST SURGERY  02/1999   status post bilateral breast reduction  . 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 REDUCTION MAMMAPLASTY Bilateral 1999  . TUBAL LIGATION      SOCIAL HISTORY: Social History   Tobacco Use  . Smoking status: Never Smoker  . Smokeless tobacco: Never Used  Substance Use Topics  . Alcohol use: Yes    Comment: Occasional alcohol use; once or twice a year  . Drug use: No    FAMILY HISTORY: Family History  Problem Relation Age of Onset  . Hypertension Mother   . Cancer Mother   . Cancer Father        bladder cancer  . Asthma Sister   . Heart disease Sister   . Coronary artery disease Maternal Grandmother   . Coronary artery disease Paternal Grandmother   . Heart attack Paternal Grandmother   . Asthma Paternal Grandmother   . Colon cancer Neg Hx   . Colon polyps Neg Hx   . Esophageal cancer Neg Hx   . Rectal cancer Neg Hx   . Stomach cancer Neg Hx     ROS: Review of Systems  Constitutional: Negative for weight loss.  Respiratory: Negative for shortness of breath.   Cardiovascular: Negative for chest pain.  Gastrointestinal: Negative for nausea and vomiting.  Musculoskeletal:       Negative for muscle weakness    PHYSICAL  EXAM: Blood pressure 128/76, pulse 65, temperature 97.9 F (36.6 C), temperature source Oral, height 5\' 2"  (1.575 m), weight 197 lb (89.4 kg), last menstrual period 11/16/2016, SpO2 98 %. Body mass index is 36.03 kg/m. Physical Exam Vitals signs reviewed.  Constitutional:      Appearance: Normal appearance. She is obese.  Cardiovascular:     Rate and Rhythm: Normal rate.     Pulses: Normal pulses.  Pulmonary:     Effort: Pulmonary effort is normal.  Musculoskeletal: Normal range of motion.  Skin:    General: Skin is warm and dry.  Neurological:     Mental Status: She is alert and oriented to person, place, and time.  Psychiatric:        Mood and Affect: Mood normal.        Behavior: Behavior normal.     RECENT LABS AND TESTS: BMET    Component Value  Date/Time   NA 141 05/16/2018 0952   K 4.6 05/16/2018 0952   CL 101 05/16/2018 0952   CO2 25 05/16/2018 0952   GLUCOSE 85 05/16/2018 0952   BUN 10 05/16/2018 0952   CREATININE 0.71 05/16/2018 0952   CALCIUM 10.0 05/16/2018 0952   GFRNONAA 98 05/16/2018 0952   GFRAA 113 05/16/2018 0952   Lab Results  Component Value Date   HGBA1C 5.2 05/16/2018   HGBA1C 5.2 02/01/2018   HGBA1C 5.1 05/20/2016   Lab Results  Component Value Date   INSULIN 10.8 05/16/2018   CBC    Component Value Date/Time   WBC 5.3 05/16/2018 0952   RBC 4.58 05/16/2018 0952   HGB 13.7 05/16/2018 0952   HCT 40.8 05/16/2018 0952   PLT 330 02/01/2018 1622   MCV 89 05/16/2018 0952   MCH 29.9 05/16/2018 0952   MCHC 33.6 05/16/2018 0952   RDW 12.9 05/16/2018 0952   LYMPHSABS 1.7 05/16/2018 0952   EOSABS 0.2 05/16/2018 0952   BASOSABS 0.1 05/16/2018 0952   Iron/TIBC/Ferritin/ %Sat No results found for: IRON, TIBC, FERRITIN, IRONPCTSAT Lipid Panel     Component Value Date/Time   CHOL 216 (H) 05/16/2018 0952   TRIG 63 05/16/2018 0952   HDL 78 05/16/2018 0952   CHOLHDL 3.1 02/01/2018 1622   LDLCALC 125 (H) 05/16/2018 0952   Hepatic Function  Panel     Component Value Date/Time   PROT 7.6 05/16/2018 0952   ALBUMIN 4.7 05/16/2018 0952   AST 22 05/16/2018 0952   ALT 25 05/16/2018 0952   ALKPHOS 69 05/16/2018 0952   BILITOT 0.5 05/16/2018 0952      Component Value Date/Time   TSH 2.810 05/16/2018 0952   TSH 3.570 02/01/2018 1622   TSH 1.940 05/20/2016 1011    Ref. Range 05/16/2018 09:52  Vitamin D, 25-Hydroxy Latest Ref Range: 30.0 - 100.0 ng/mL 24.4 (L)     OBESITY BEHAVIORAL INTERVENTION VISIT  Today's visit was # 6   Starting weight: 207 lbs Starting date: 05/16/2018 Today's weight :: 197 lbs Today's date: 08/09/2018 Total lbs lost to date: 10  ASK: We discussed the diagnosis of obesity with Andrea Alvarez today and Andrea Alvarez agreed to give [place] permission to discuss obesity behavioral modification therapy today.  ASSESS: Andrea Alvarez has the diagnosis of obesity and her BMI today is 36.02 Andrea Alvarez is in the action stage of change   ADVISE: Andrea Alvarez was educated on the multiple health risks of obesity as well as the benefit of weight loss to improve her health. She was advised of the need for long term treatment and the importance of lifestyle modifications to improve her current health and to decrease her risk of future health problems.  AGREE: Multiple dietary modification options and treatment options were discussed and  Andrea Alvarez agreed to follow the recommendations documented in the above note.  ARRANGE: Andrea Alvarez was educated on the importance of frequent visits to treat obesity as outlined per CMS and USPSTF guidelines and agreed to schedule her next follow up appointment today.  I, Andrea Alvarez, am acting as Energy manager for [employer].  I have reviewed the above documentation for accuracy and completeness, and I agree with the above.  - Andrea Keisler, FNP-C.

## 2018-08-14 ENCOUNTER — Encounter (INDEPENDENT_AMBULATORY_CARE_PROVIDER_SITE_OTHER): Payer: Self-pay | Admitting: Family Medicine

## 2018-08-14 DIAGNOSIS — E7849 Other hyperlipidemia: Secondary | ICD-10-CM | POA: Insufficient documentation

## 2018-08-28 ENCOUNTER — Other Ambulatory Visit (INDEPENDENT_AMBULATORY_CARE_PROVIDER_SITE_OTHER): Payer: Self-pay | Admitting: Family Medicine

## 2018-08-28 ENCOUNTER — Encounter (INDEPENDENT_AMBULATORY_CARE_PROVIDER_SITE_OTHER): Payer: Self-pay | Admitting: Family Medicine

## 2018-08-28 ENCOUNTER — Ambulatory Visit (INDEPENDENT_AMBULATORY_CARE_PROVIDER_SITE_OTHER): Payer: 59 | Admitting: Family Medicine

## 2018-08-28 VITALS — BP 120/81 | HR 68 | Temp 97.9°F | Ht 62.0 in | Wt 196.0 lb

## 2018-08-28 DIAGNOSIS — E7849 Other hyperlipidemia: Secondary | ICD-10-CM | POA: Diagnosis not present

## 2018-08-28 DIAGNOSIS — Z6835 Body mass index (BMI) 35.0-35.9, adult: Secondary | ICD-10-CM

## 2018-08-28 DIAGNOSIS — E8881 Metabolic syndrome: Secondary | ICD-10-CM

## 2018-08-28 DIAGNOSIS — Z9189 Other specified personal risk factors, not elsewhere classified: Secondary | ICD-10-CM

## 2018-08-28 DIAGNOSIS — E559 Vitamin D deficiency, unspecified: Secondary | ICD-10-CM | POA: Diagnosis not present

## 2018-08-28 DIAGNOSIS — E88819 Insulin resistance, unspecified: Secondary | ICD-10-CM | POA: Insufficient documentation

## 2018-08-28 MED ORDER — METFORMIN HCL 500 MG PO TABS: 500.0000 mg | ORAL_TABLET | Freq: Every day | ORAL | 0 refills | Status: DC

## 2018-08-28 NOTE — Progress Notes (Signed)
Office: [phone]  /  Fax: [phone]   HPI:   Chief Complaint: OBESITY Andrea Alvarez is here to discuss her progress with her obesity treatment plan. She is on the Category 2 plan and is following her eating plan approximately 70 % of the time. She states she is biking and exercising with weighted hula-hoop 30 minutes 5 times per week. Andrea Alvarez is not eating all calories. She reports increased hunger with exercise. She has increased her exercising recently. Her weight is 196 lb (88.9 kg) today and has had a weight loss of 1 pounds over a period of 3 weeks since her last visit. She has lost 11 lbs since starting treatment with us.  Insulin Resistance Andrea Alvarez has a diagnosis of insulin resistance based on her elevated fasting insulin level >5. Although Shemica's blood glucose readings are still under good control, insulin resistance puts her at greater risk of metabolic syndrome and diabetes. She is not taking metformin currently and continues to work on diet and exercise to decrease risk of diabetes. She reports polyphagia.  Hyperlipidemia Andrea Alvarez has hyperlipidemia and has been trying to improve her cholesterol levels with intensive lifestyle modification including a low saturated fat diet, exercise and weight loss. She is not on a statin medication. She denies any chest pain or shortness of breath. Her last LDL 125, HDL 78 and triglycerides 63.  Vitamin D deficiency Andrea Alvarez has a diagnosis of vitamin D deficiency. She is currently taking prescription Vit D and denies nausea, vomiting or muscle weakness. She is not at goal. Her last Vit D was 24.4 on 05/16/2018.  At risk for diabetes Andrea Alvarez is at higher than average risk for developing diabetes due to her obesity. She currently denies polyuria or polydipsia.   ASSESSMENT AND PLAN:  Insulin resistance - Plan: Comprehensive metabolic panel, Hemoglobin A1c, Insulin, random, metFORMIN (GLUCOPHAGE) 500 MG tablet  Other hyperlipidemia - Plan: Lipid  Panel With LDL/HDL Ratio  Vitamin D deficiency - Plan: VITAMIN D 25 Hydroxy (Vit-D Deficiency, Fractures)  At risk for diabetes mellitus  Class 2 severe obesity with serious comorbidity and body mass index (BMI) of 35.0 to 35.9 in adult, unspecified obesity type (HCC)  PLAN:  Insulin Resistance Andrea Alvarez will continue to work on weight loss, exercise, and decreasing simple carbohydrates in her diet to help decrease the risk of diabetes. We discussed metformin including benefits and risks. She was informed that eating too many simple carbohydrates or too many calories at one sitting increases the likelihood of GI side effects. Andrea Alvarez agrees to start taking metformin 500 mg every morning #30 with no refills for now and prescription was written today. We will check labs today and Andrea Alvarez agrees to follow up with our clinic in 2 weeks.  Hyperlipidemia Andrea Alvarez was informed of the American Heart Association Guidelines emphasizing intensive lifestyle modifications as the first line treatment for hyperlipidemia. We discussed many lifestyle modifications today in depth, and Andrea Alvarez will continue to work on decreasing saturated fats such as fatty red meat, butter and many fried foods. She will also increase vegetables and lean protein in her diet and continue to work on exercise and weight loss efforts. We will check labs today and Andrea Alvarez agrees to follow up with our clinic in 2 weeks.  Vitamin D Deficiency Andrea Alvarez was informed that low vitamin D levels contributes to fatigue and are associated with obesity, breast, and colon cancer. She agrees to continue to take prescription Vit D 50,000 IU every week and will follow up for routine testing  of vitamin D, at least 2-3 times per year. She was informed of the risk of over-replacement of vitamin D and agrees to not increase her dose unless she discusses this with us first. We will check labs today and Andrea Alvarez agrees to follow up with our clinic in 2 weeks.  Diabetes  risk counseling Andrea Alvarez was given extended (15 minutes) diabetes prevention counseling today. She is 53 y.o. female and has risk factors for diabetes including obesity. We discussed intensive lifestyle modifications today with an emphasis on weight loss as well as increasing exercise and decreasing simple carbohydrates in her diet.  Obesity Andrea Alvarez is currently in the action stage of change. As such, her goal is to continue with weight loss efforts She has agreed to follow the Category 2 plan Andrea Alvarez has been instructed to continue with biking and weighted hula-hoop 30 minutes 5 times per week for weight loss and overall health benefits. We discussed the following Behavioral Modification Strategies today: increasing lean protein intake  Andrea Alvarez has agreed to follow up with our clinic in 2 weeks. She was informed of the importance of frequent follow up visits to maximize her success with intensive lifestyle modifications for her multiple health conditions.  ALLERGIES: No Known Allergies  MEDICATIONS: Current Outpatient Medications on File Prior to Visit  Medication Sig Dispense Refill  . naproxen sodium (ALEVE) 220 MG tablet Take 440 mg by mouth 2 (two) times daily as needed.    . Vitamin D, Ergocalciferol, (DRISDOL) 1.25 MG (50000 UT) CAPS capsule Take 1 capsule (50,000 Units total) by mouth every 7 (seven) days. 4 capsule 0   Current Facility-Administered Medications on File Prior to Visit  Medication Dose Route Frequency Provider Last Rate Last Dose  . triamcinolone acetonide (KENALOG) 10 MG/ML injection 10 mg  10 mg Other Once Asencion IslamStover, Titorya, DPM        PAST MEDICAL HISTORY: Past Medical History:  Diagnosis Date  . Anemia   . Back pain   . Constipation   . GERD (gastroesophageal reflux disease)   . Joint pain   . Leg edema   . Obesity   . Vitamin D deficiency     PAST SURGICAL HISTORY: Past Surgical History:  Procedure Laterality Date  . BREAST SURGERY  02/1999   status  post bilateral breast reduction  . 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 REDUCTION MAMMAPLASTY Bilateral 1999  . TUBAL LIGATION      SOCIAL HISTORY: Social History   Tobacco Use  . Smoking status: Never Smoker  . Smokeless tobacco: Never Used  Substance Use Topics  . Alcohol use: Yes    Comment: Occasional alcohol use; once or twice a year  . Drug use: No    FAMILY HISTORY: Family History  Problem Relation Age of Onset  . Hypertension Mother   . Cancer Mother   . Cancer Father        bladder cancer  . Asthma Sister   . Heart disease Sister   . Coronary artery disease Maternal Grandmother   . Coronary artery disease Paternal Grandmother   . Heart attack Paternal Grandmother   . Asthma Paternal Grandmother   . Colon cancer Neg Hx   . Colon polyps Neg Hx   . Esophageal cancer Neg Hx   . Rectal cancer Neg Hx   . Stomach cancer Neg Hx     ROS: Review of Systems  Constitutional: Positive for weight loss.  Respiratory: Negative for shortness of breath.  Cardiovascular: Negative for chest pain.  Gastrointestinal: Negative for nausea and vomiting.  Musculoskeletal:       Negative for muscle weakness  Endo/Heme/Allergies: Negative for polydipsia.       Positive for polyphagia    PHYSICAL EXAM: Blood pressure 120/81, pulse 68, temperature 97.9 F (36.6 C), temperature source Oral, height 5\' 2"  (1.575 m), weight 196 lb (88.9 kg), last menstrual period 11/16/2016, SpO2 97 %. Body mass index is 35.85 kg/m. Physical Exam Vitals signs reviewed.  Constitutional:      Appearance: Normal appearance. She is obese.  Cardiovascular:     Rate and Rhythm: Normal rate.     Pulses: Normal pulses.  Pulmonary:     Effort: Pulmonary effort is normal.  Musculoskeletal: Normal range of motion.  Skin:    General: Skin is warm and dry.  Neurological:     Mental Status: She is alert and oriented to person, place, and time.  Psychiatric:        Mood and  Affect: Mood normal.        Behavior: Behavior normal.     RECENT LABS AND TESTS: BMET    Component Value Date/Time   NA 141 05/16/2018 0952   K 4.6 05/16/2018 0952   CL 101 05/16/2018 0952   CO2 25 05/16/2018 0952   GLUCOSE 85 05/16/2018 0952   BUN 10 05/16/2018 0952   CREATININE 0.71 05/16/2018 0952   CALCIUM 10.0 05/16/2018 0952   GFRNONAA 98 05/16/2018 0952   GFRAA 113 05/16/2018 0952   Lab Results  Component Value Date   HGBA1C 5.2 05/16/2018   HGBA1C 5.2 02/01/2018   HGBA1C 5.1 05/20/2016   Lab Results  Component Value Date   INSULIN 10.8 05/16/2018   CBC    Component Value Date/Time   WBC 5.3 05/16/2018 0952   RBC 4.58 05/16/2018 0952   HGB 13.7 05/16/2018 0952   HCT 40.8 05/16/2018 0952   PLT 330 02/01/2018 1622   MCV 89 05/16/2018 0952   MCH 29.9 05/16/2018 0952   MCHC 33.6 05/16/2018 0952   RDW 12.9 05/16/2018 0952   LYMPHSABS 1.7 05/16/2018 0952   EOSABS 0.2 05/16/2018 0952   BASOSABS 0.1 05/16/2018 0952   Iron/TIBC/Ferritin/ %Sat No results found for: IRON, TIBC, FERRITIN, IRONPCTSAT Lipid Panel     Component Value Date/Time   CHOL 216 (H) 05/16/2018 0952   TRIG 63 05/16/2018 0952   HDL 78 05/16/2018 0952   CHOLHDL 3.1 02/01/2018 1622   LDLCALC 125 (H) 05/16/2018 0952   Hepatic Function Panel     Component Value Date/Time   PROT 7.6 05/16/2018 0952   ALBUMIN 4.7 05/16/2018 0952   AST 22 05/16/2018 0952   ALT 25 05/16/2018 0952   ALKPHOS 69 05/16/2018 0952   BILITOT 0.5 05/16/2018 0952      Component Value Date/Time   TSH 2.810 05/16/2018 0952   TSH 3.570 02/01/2018 1622   TSH 1.940 05/20/2016 1011    Ref. Range 05/16/2018 09:52  Vitamin D, 25-Hydroxy Latest Ref Range: 30.0 - 100.0 ng/mL 24.4 (L)     OBESITY BEHAVIORAL INTERVENTION VISIT  Today's visit was # 7   Starting weight: 207 lbs Starting date: 05/16/2018 Today's weight :: 196 lbs Today's date: 08/28/2018 Total lbs lost to date: 34   ASK: We discussed the  diagnosis of obesity with Henry Russel today and Andrea December agreed to give [place] permission to discuss obesity behavioral modification therapy today.  ASSESS: Lyna has the diagnosis of obesity and  her BMI today is 35.84 Mizani is in the action stage of change   ADVISE: Calvary was educated on the multiple health risks of obesity as well as the benefit of weight loss to improve her health. She was advised of the need for long term treatment and the importance of lifestyle modifications to improve her current health and to decrease her risk of future health problems.  AGREE: Multiple dietary modification options and treatment options were discussed and  Colby agreed to follow the recommendations documented in the above note.  ARRANGE: Mirabel was educated on the importance of frequent visits to treat obesity as outlined per CMS and USPSTF guidelines and agreed to schedule her next follow up appointment today.  I Tammy Wysor, am acting as Energy manager for [employer], FNP-C.  I have reviewed the above documentation for accuracy and completeness, and I agree with the above.  -  , FNP-C.

## 2018-08-29 LAB — COMPREHENSIVE METABOLIC PANEL
ALT: 24 IU/L (ref 0–32)
AST: 19 IU/L (ref 0–40)
Albumin/Globulin Ratio: 1.7 (ref 1.2–2.2)
Albumin: 4.6 g/dL (ref 3.8–4.9)
Alkaline Phosphatase: 70 IU/L (ref 39–117)
BUN/Creatinine Ratio: 20 (ref 9–23)
BUN: 14 mg/dL (ref 6–24)
Bilirubin Total: 0.3 mg/dL (ref 0.0–1.2)
CO2: 23 mmol/L (ref 20–29)
Calcium: 9.8 mg/dL (ref 8.7–10.2)
Chloride: 104 mmol/L (ref 96–106)
Creatinine, Ser: 0.71 mg/dL (ref 0.57–1.00)
GFR calc Af Amer: 113 mL/min/{1.73_m2} (ref >59)
GFR calc non Af Amer: 98 mL/min/{1.73_m2} (ref >59)
Globulin, Total: 2.7 g/dL (ref 1.5–4.5)
Glucose: 87 mg/dL (ref 65–99)
Potassium: 4.6 mmol/L (ref 3.5–5.2)
Sodium: 143 mmol/L (ref 134–144)
Total Protein: 7.3 g/dL (ref 6.0–8.5)

## 2018-08-29 LAB — LIPID PANEL WITH LDL/HDL RATIO
Cholesterol, Total: 207 mg/dL — ABNORMAL HIGH (ref 100–199)
HDL: 73 mg/dL (ref >39)
LDL Calculated: 123 mg/dL — ABNORMAL HIGH (ref 0–99)
LDl/HDL Ratio: 1.7 ratio (ref 0.0–3.2)
Triglycerides: 56 mg/dL (ref 0–149)
VLDL Cholesterol Cal: 11 mg/dL (ref 5–40)

## 2018-08-29 LAB — INSULIN, RANDOM: INSULIN: 13.8 u[IU]/mL (ref 2.6–24.9)

## 2018-08-29 LAB — HEMOGLOBIN A1C
Est. average glucose Bld gHb Est-mCnc: 108 mg/dL
Hgb A1c MFr Bld: 5.4 % (ref 4.8–5.6)

## 2018-08-29 LAB — VITAMIN D 25 HYDROXY (VIT D DEFICIENCY, FRACTURES): Vit D, 25-Hydroxy: 42.2 ng/mL (ref 30.0–100.0)

## 2018-09-11 ENCOUNTER — Encounter (INDEPENDENT_AMBULATORY_CARE_PROVIDER_SITE_OTHER): Payer: Self-pay | Admitting: Family Medicine

## 2018-09-11 ENCOUNTER — Ambulatory Visit (INDEPENDENT_AMBULATORY_CARE_PROVIDER_SITE_OTHER): Payer: 59 | Admitting: Family Medicine

## 2018-09-11 VITALS — BP 120/75 | HR 70 | Temp 98.5°F | Ht 62.0 in | Wt 196.0 lb

## 2018-09-11 DIAGNOSIS — E8881 Metabolic syndrome: Secondary | ICD-10-CM | POA: Diagnosis not present

## 2018-09-11 DIAGNOSIS — Z9189 Other specified personal risk factors, not elsewhere classified: Secondary | ICD-10-CM | POA: Diagnosis not present

## 2018-09-11 DIAGNOSIS — Z6836 Body mass index (BMI) 36.0-36.9, adult: Secondary | ICD-10-CM

## 2018-09-11 DIAGNOSIS — E559 Vitamin D deficiency, unspecified: Secondary | ICD-10-CM

## 2018-09-11 MED ORDER — VITAMIN D (ERGOCALCIFEROL) 1.25 MG (50000 UNIT) PO CAPS: 50000.0000 [IU] | ORAL_CAPSULE | ORAL | 0 refills | Status: DC

## 2018-09-11 MED ORDER — METFORMIN HCL 500 MG PO TABS: 500.0000 mg | ORAL_TABLET | Freq: Two times a day (BID) | ORAL | 0 refills | Status: DC

## 2018-09-11 NOTE — Progress Notes (Signed)
Office: [phone]  /  Fax: [phone]   HPI:   Chief Complaint: OBESITY Andrea Alvarez is here to discuss her progress with her obesity treatment plan. She is on the Category 2 plan and is following her eating plan approximately 75-80 % of the time. She states she is biking, exercising with hula-hoop and weights 30 minutes 3 times per week. Telethia has had recent birthday celebrations. She also indulged in cinnamon rolls at work. She is eating all protein on plan.  Her weight is 196 lb (88.9 kg) today and has had a weight loss of 0  pounds over a period of 2 weeks since her last visit. She has lost 11 lbs since starting treatment with Andrea Alvarez.  Vitamin D deficiency Andrea Alvarez has a diagnosis of vitamin D deficiency. She is currently taking prescription Vit D and denies nausea, vomiting or muscle weakness.  Insulin Resistance Andrea Alvarez has a diagnosis of insulin resistance based on her elevated fasting insulin level >5. Although Andrea Alvarez's blood glucose readings are still under good control, insulin resistance puts her at greater risk of metabolic syndrome and diabetes. She was started on metformin qam at last visit, she is taking it at lunch and it is helping with polyphagia. Andrea Alvarez reports polyphagia between lunch and dinner. She continues to work on diet and exercise to decrease risk of diabetes. Lab Results  Component Value Date   HGBA1C 5.4 08/28/2018     At risk for osteopenia and osteoporosis Karel is at higher risk of osteopenia and osteoporosis due to vitamin D deficiency.   ASSESSMENT AND PLAN:  Vitamin D deficiency - Plan: Vitamin D, Ergocalciferol, (DRISDOL) 1.25 MG (50000 UT) CAPS capsule  Insulin resistance - Plan: metFORMIN (GLUCOPHAGE) 500 MG tablet  At risk for osteoporosis  Class 2 severe obesity with serious comorbidity and body mass index (BMI) of 36.0 to 36.9 in adult, unspecified obesity type (HCC)  PLAN:  Vitamin D Deficiency Andrea Alvarez was informed that low vitamin D levels  contributes to fatigue and are associated with obesity, breast, and colon cancer. She agrees to continue to take prescription Vit D 50,000 IU every week #4 with no refills and will follow up for routine testing of vitamin D, at least 2-3 times per year. She was informed of the risk of over-replacement of vitamin D and agrees to not increase her dose unless she discusses this with Andrea Alvarez first. Andrea Alvarez agrees to follow up with our clinic in 2 weeks.  Insulin Resistance Andrea Alvarez will continue to work on weight loss, exercise, and decreasing simple carbohydrates in her diet to help decrease the risk of diabetes.  We will increase metformin to bid with meals. Andrea Alvarez agrees to continue with meal plan and take metformin 500 mg bid with meals #60 with no refills for now and prescription was written today. She denies nausea, vomiting and diarrhea. Andrea Alvarez agrees to follow up with our clinic in 2 weeks.  At risk for osteopenia and osteoporosis Andrea Alvarez was given extended  (15 minutes) osteoporosis prevention counseling today. Andrea Alvarez is at risk for osteopenia and osteoporosis due to her vitamin D deficiency. She was encouraged to take her vitamin D and follow her higher calcium diet and increase strengthening exercise to help strengthen her bones and decrease her risk of osteopenia and osteoporosis.  Obesity Andrea Alvarez is currently in the action stage of change. As such, her goal is to continue with weight loss efforts She has agreed to follow the Category 3 plan Andrea Alvarez has been instructed to continue biking, exercising  with hula-hoop and weights for 30 minutes 3 times per week and increase walking to 3 days per week. We discussed the following Behavioral Modification Strategies today: better snacking choices, avoiding temptations and planning for success   Andrea Alvarez has agreed to follow up with our clinic in 2 weeks. She was informed of the importance of frequent follow up visits to maximize her success with intensive  lifestyle modifications for her multiple health conditions.  ALLERGIES: No Known Allergies  MEDICATIONS: Current Outpatient Medications on File Prior to Visit  Medication Sig Dispense Refill  . naproxen sodium (ALEVE) 220 MG tablet Take 440 mg by mouth 2 (two) times daily as needed.     Current Facility-Administered Medications on File Prior to Visit  Medication Dose Route Frequency Provider Last Rate Last Dose  . triamcinolone acetonide (KENALOG) 10 MG/ML injection 10 mg  10 mg Other Once Asencion Islam, DPM        PAST MEDICAL HISTORY: Past Medical History:  Diagnosis Date  . Anemia   . Back pain   . Constipation   . GERD (gastroesophageal reflux disease)   . Joint pain   . Leg edema   . Obesity   . Vitamin D deficiency     PAST SURGICAL HISTORY: Past Surgical History:  Procedure Laterality Date  . BREAST SURGERY  02/1999   status post bilateral breast reduction  . 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 REDUCTION MAMMAPLASTY Bilateral 1999  . TUBAL LIGATION      SOCIAL HISTORY: Social History   Tobacco Use  . Smoking status: Never Smoker  . Smokeless tobacco: Never Used  Substance Use Topics  . Alcohol use: Yes    Comment: Occasional alcohol use; once or twice a year  . Drug use: No    FAMILY HISTORY: Family History  Problem Relation Age of Onset  . Hypertension Mother   . Cancer Mother   . Cancer Father        bladder cancer  . Asthma Sister   . Heart disease Sister   . Coronary artery disease Maternal Grandmother   . Coronary artery disease Paternal Grandmother   . Heart attack Paternal Grandmother   . Asthma Paternal Grandmother   . Colon cancer Neg Hx   . Colon polyps Neg Hx   . Esophageal cancer Neg Hx   . Rectal cancer Neg Hx   . Stomach cancer Neg Hx     ROS: Review of Systems  Constitutional: Negative for weight loss.  Gastrointestinal: Negative for nausea and vomiting.  Genitourinary:       Negative for  polyuria  Musculoskeletal:       Negative for muscle weakness  Endo/Heme/Allergies:       Negative for polyphagia    PHYSICAL EXAM: Blood pressure 120/75, pulse 70, temperature 98.5 F (36.9 C), temperature source Oral, height 5\' 2"  (1.575 m), weight 196 lb (88.9 kg), last menstrual period 11/16/2016, SpO2 96 %. Body mass index is 35.85 kg/m. Physical Exam Vitals signs reviewed.  Constitutional:      Appearance: Normal appearance. She is obese.  Cardiovascular:     Rate and Rhythm: Normal rate.     Pulses: Normal pulses.  Pulmonary:     Effort: Pulmonary effort is normal.  Musculoskeletal: Normal range of motion.  Skin:    General: Skin is warm and dry.  Neurological:     Mental Status: She is alert and oriented to person, place, and time.  Psychiatric:  Mood and Affect: Mood normal.        Behavior: Behavior normal.     RECENT LABS AND TESTS: BMET    Component Value Date/Time   NA 143 08/28/2018 1256   K 4.6 08/28/2018 1256   CL 104 08/28/2018 1256   CO2 23 08/28/2018 1256   GLUCOSE 87 08/28/2018 1256   BUN 14 08/28/2018 1256   CREATININE 0.71 08/28/2018 1256   CALCIUM 9.8 08/28/2018 1256   GFRNONAA 98 08/28/2018 1256   GFRAA 113 08/28/2018 1256   Lab Results  Component Value Date   HGBA1C 5.4 08/28/2018   HGBA1C 5.2 05/16/2018   HGBA1C 5.2 02/01/2018   HGBA1C 5.1 05/20/2016   Lab Results  Component Value Date   INSULIN 13.8 08/28/2018   INSULIN 10.8 05/16/2018   CBC    Component Value Date/Time   WBC 5.3 05/16/2018 0952   RBC 4.58 05/16/2018 0952   HGB 13.7 05/16/2018 0952   HCT 40.8 05/16/2018 0952   PLT 330 02/01/2018 1622   MCV 89 05/16/2018 0952   MCH 29.9 05/16/2018 0952   MCHC 33.6 05/16/2018 0952   RDW 12.9 05/16/2018 0952   LYMPHSABS 1.7 05/16/2018 0952   EOSABS 0.2 05/16/2018 0952   BASOSABS 0.1 05/16/2018 0952   Iron/TIBC/Ferritin/ %Sat No results found for: IRON, TIBC, FERRITIN, IRONPCTSAT Lipid Panel     Component  Value Date/Time   CHOL 207 (H) 08/28/2018 1256   TRIG 56 08/28/2018 1256   HDL 73 08/28/2018 1256   CHOLHDL 3.1 02/01/2018 1622   LDLCALC 123 (H) 08/28/2018 1256   Hepatic Function Panel     Component Value Date/Time   PROT 7.3 08/28/2018 1256   ALBUMIN 4.6 08/28/2018 1256   AST 19 08/28/2018 1256   ALT 24 08/28/2018 1256   ALKPHOS 70 08/28/2018 1256   BILITOT 0.3 08/28/2018 1256      Component Value Date/Time   TSH 2.810 05/16/2018 0952   TSH 3.570 02/01/2018 1622   TSH 1.940 05/20/2016 1011     Ref. Range 08/28/2018 12:56  Vitamin D, 25-Hydroxy Latest Ref Range: 30.0 - 100.0 ng/mL 42.2     OBESITY BEHAVIORAL INTERVENTION VISIT  Today's visit was # 8   Starting weight: 207 lbs Starting date: 05/16/2018 Today's weight :: 196 lbs Today's date: 09/11/2018 Total lbs lost to date: 11   09/11/2018  BP 120/75  Temp 98.5 F (36.9 C)  Pulse 70  SpO2 96 %  Weight 196 lb  Height  (1.575 m)  BMI (Calculated) 35.84    ASK: We discussed the diagnosis of obesity with Andrea Alvarez today and Andrea Alvarez agreed to give Andrea Alvarez permission to discuss obesity behavioral modification therapy today.  ASSESS: Andrea Alvarez has the diagnosis of obesity and her BMI today is 35.84 Andrea Alvarez is in the action stage of change   ADVISE: Jazalyn was educated on the multiple health risks of obesity as well as the benefit of weight loss to improve her health. She was advised of the need for long term treatment and the importance of lifestyle modifications to improve her current health and to decrease her risk of future health problems.  AGREE: Multiple dietary modification options and treatment options were discussed and  Andrea Alvarez agreed to follow the recommendations documented in the above note.  ARRANGE: Andrea Alvarez was educated on the importance of frequent visits to treat obesity as outlined per CMS and USPSTF guidelines and agreed to schedule her next follow up appointment today.  I, Andrea Alvarez,  am  acting as Energy manager for [place], FNP-C.  I have reviewed the above documentation for accuracy and completeness, and I agree with the above.  - Andrea Sek, FNP-C.

## 2018-09-12 ENCOUNTER — Encounter (INDEPENDENT_AMBULATORY_CARE_PROVIDER_SITE_OTHER): Payer: Self-pay | Admitting: Family Medicine

## 2018-09-25 ENCOUNTER — Ambulatory Visit (INDEPENDENT_AMBULATORY_CARE_PROVIDER_SITE_OTHER): Payer: 59 | Admitting: Family Medicine

## 2018-09-25 VITALS — BP 114/78 | HR 76 | Temp 98.0°F | Ht 62.0 in | Wt 195.0 lb

## 2018-09-25 DIAGNOSIS — E559 Vitamin D deficiency, unspecified: Secondary | ICD-10-CM

## 2018-09-25 DIAGNOSIS — Z9189 Other specified personal risk factors, not elsewhere classified: Secondary | ICD-10-CM

## 2018-09-25 DIAGNOSIS — E8881 Metabolic syndrome: Secondary | ICD-10-CM

## 2018-09-25 DIAGNOSIS — Z6835 Body mass index (BMI) 35.0-35.9, adult: Secondary | ICD-10-CM

## 2018-09-25 MED ORDER — VITAMIN D (ERGOCALCIFEROL) 1.25 MG (50000 UNIT) PO CAPS: 50000.0000 [IU] | ORAL_CAPSULE | ORAL | 0 refills | Status: DC

## 2018-09-26 ENCOUNTER — Encounter (INDEPENDENT_AMBULATORY_CARE_PROVIDER_SITE_OTHER): Payer: Self-pay | Admitting: Family Medicine

## 2018-09-26 NOTE — Progress Notes (Signed)
Office: [phone]  /  Fax: [phone]   HPI:   Chief Complaint: OBESITY Andrea Alvarez is here to discuss her progress with her obesity treatment plan. She is on the Category 3 plan and is following her eating plan approximately 75% of the time. She states she is walking 12,000 steps 4-5 times per week. Hariklia states she tend to eat out for dinner because she works 2 jobs. Her weight is 195 lb (88.5 kg) today and has had a weight loss of 1 pound over a period of 2 weeks since her last visit. She has lost 12 lbs since starting treatment with [place].  Vitamin D deficiency Andrea Alvarez has a diagnosis of Vitamin D deficiency and is not at goal. Her last Vitamin D level was reported to be 42.2 on 08/28/2018. She is currently taking prescription Vit D and denies nausea, vomiting or muscle weakness.  At risk for osteopenia and osteoporosis Andrea Alvarez is at higher risk of osteopenia and osteoporosis due to Vitamin D deficiency.   Insulin Resistance Andrea Alvarez has a diagnosis of insulin resistance based on her elevated fasting insulin level >5. Although Andrea Alvarez's blood glucose readings are still under good control, insulin resistance puts her at greater risk of metabolic syndrome and diabetes. She is taking metformin currently and continues to work on diet and exercise to decrease risk of diabetes. She denies polyphagia. Lab Results  Component Value Date   HGBA1C 5.4 08/28/2018    ASSESSMENT AND PLAN:  Vitamin D deficiency - Plan: Vitamin D, Ergocalciferol, (DRISDOL) 1.25 MG (50000 UT) CAPS capsule  Insulin resistance  At risk for osteoporosis  Class 2 severe obesity with serious comorbidity and body mass index (BMI) of 35.0 to 35.9 in adult, unspecified obesity type (HCC)  PLAN:  Vitamin D Deficiency Andrea Alvarez was informed that low Vitamin D levels contributes to fatigue and are associated with obesity, breast, and colon cancer. She agrees to continue to take prescription Vit D @ 50,000 IU every week #4 with  0 refills and will follow-up for routine testing of Vitamin D, at least 2-3 times per year. She was informed of the risk of over-replacement of Vitamin D and agrees to not increase her dose unless she discusses this with [place] first. Andrea Alvarez agrees to follow-up with our clinic in 3 weeks.  At risk for osteopenia and osteoporosis Andrea Alvarez was given extended  (15 minutes) osteoporosis prevention counseling today. Andrea Alvarez is at risk for osteopenia and osteoporsis due to her Vitamin D deficiency. She was encouraged to take her Vitamin D and follow her higher calcium diet and increase strengthening exercise to help strengthen her bones and decrease her risk of osteopenia and osteoporosis.  Insulin Resistance Andrea Alvarez will continue to work on weight loss, exercise, and decreasing simple carbohydrates in her diet to help decrease the risk of diabetes. We dicussed metformin including benefits and risks. She was informed that eating too many simple carbohydrates or too many calories at one sitting increases the likelihood of GI side effects. Andrea Alvarez will continue on metformin and agrees to follow-up with our clinic in 3 weeks.   Obesity Andrea Alvarez is currently in the action stage of change. As such, her goal is to continue with weight loss efforts. She has agreed to follow the Category 3 plan and may journal 450-600 calories and 40+ grams of protein at supper. Handouts were given on Dining Out, Journaling, and Protein Content. Andrea Alvarez has been instructed to continue her exercise regimen as noted above. We discussed the following Behavioral Modification Strategies  today: increasing lean protein intake, planning for success, and keep a strict food journal.  Andrea Alvarez has agreed to follow-up with our clinic in 3 weeks. She was informed of the importance of frequent follow up visits to maximize her success with intensive lifestyle modifications for her multiple health conditions.  ALLERGIES: No Known  Allergies  MEDICATIONS: Current Outpatient Medications on File Prior to Visit  Medication Sig Dispense Refill  . metFORMIN (GLUCOPHAGE) 500 MG tablet Take 1 tablet (500 mg total) by mouth 2 (two) times daily with a meal. 60 tablet 0  . naproxen sodium (ALEVE) 220 MG tablet Take 440 mg by mouth 2 (two) times daily as needed.     Current Facility-Administered Medications on File Prior to Visit  Medication Dose Route Frequency Provider Last Rate Last Dose  . triamcinolone acetonide (KENALOG) 10 MG/ML injection 10 mg  10 mg Other Once Asencion Islam, DPM        PAST MEDICAL HISTORY: Past Medical History:  Diagnosis Date  . Anemia   . Back pain   . Constipation   . GERD (gastroesophageal reflux disease)   . Joint pain   . Leg edema   . Obesity   . Vitamin D deficiency     PAST SURGICAL HISTORY: Past Surgical History:  Procedure Laterality Date  . BREAST SURGERY  02/1999   status post bilateral breast reduction  . 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 REDUCTION MAMMAPLASTY Bilateral 1999  . TUBAL LIGATION      SOCIAL HISTORY: Social History   Tobacco Use  . Smoking status: Never Smoker  . Smokeless tobacco: Never Used  Substance Use Topics  . Alcohol use: Yes    Comment: Occasional alcohol use; once or twice a year  . Drug use: No    FAMILY HISTORY: Family History  Problem Relation Age of Onset  . Hypertension Mother   . Cancer Mother   . Cancer Father        bladder cancer  . Asthma Sister   . Heart disease Sister   . Coronary artery disease Maternal Grandmother   . Coronary artery disease Paternal Grandmother   . Heart attack Paternal Grandmother   . Asthma Paternal Grandmother   . Colon cancer Neg Hx   . Colon polyps Neg Hx   . Esophageal cancer Neg Hx   . Rectal cancer Neg Hx   . Stomach cancer Neg Hx    ROS: Review of Systems  Constitutional: Positive for weight loss.  Gastrointestinal: Negative for nausea and vomiting.   Musculoskeletal:       Negative for muscle weakness.  Endo/Heme/Allergies:       Negative for polyphagia. Negative for hypoglycemia.   PHYSICAL EXAM: Blood pressure 114/78, pulse 76, temperature 98 F (36.7 C), height 5\' 2"  (1.575 m), weight 195 lb (88.5 kg), last menstrual period 11/16/2016, SpO2 98 %. Body mass index is 35.67 kg/m. Physical Exam Vitals signs reviewed.  Constitutional:      Appearance: Normal appearance. She is obese.  Cardiovascular:     Rate and Rhythm: Normal rate.     Pulses: Normal pulses.  Pulmonary:     Effort: Pulmonary effort is normal.     Breath sounds: Normal breath sounds.  Musculoskeletal: Normal range of motion.  Skin:    General: Skin is warm and dry.  Neurological:     Mental Status: She is alert and oriented to person, place, and time.  Psychiatric:  Behavior: Behavior normal.   RECENT LABS AND TESTS: BMET    Component Value Date/Time   NA 143 08/28/2018 1256   K 4.6 08/28/2018 1256   CL 104 08/28/2018 1256   CO2 23 08/28/2018 1256   GLUCOSE 87 08/28/2018 1256   BUN 14 08/28/2018 1256   CREATININE 0.71 08/28/2018 1256   CALCIUM 9.8 08/28/2018 1256   GFRNONAA 98 08/28/2018 1256   GFRAA 113 08/28/2018 1256   Lab Results  Component Value Date   HGBA1C 5.4 08/28/2018   HGBA1C 5.2 05/16/2018   HGBA1C 5.2 02/01/2018   HGBA1C 5.1 05/20/2016   Lab Results  Component Value Date   INSULIN 13.8 08/28/2018   INSULIN 10.8 05/16/2018   CBC    Component Value Date/Time   WBC 5.3 05/16/2018 0952   RBC 4.58 05/16/2018 0952   HGB 13.7 05/16/2018 0952   HCT 40.8 05/16/2018 0952   PLT 330 02/01/2018 1622   MCV 89 05/16/2018 0952   MCH 29.9 05/16/2018 0952   MCHC 33.6 05/16/2018 0952   RDW 12.9 05/16/2018 0952   LYMPHSABS 1.7 05/16/2018 0952   EOSABS 0.2 05/16/2018 0952   BASOSABS 0.1 05/16/2018 0952   Iron/TIBC/Ferritin/ %Sat No results found for: IRON, TIBC, FERRITIN, IRONPCTSAT Lipid Panel     Component Value  Date/Time   CHOL 207 (H) 08/28/2018 1256   TRIG 56 08/28/2018 1256   HDL 73 08/28/2018 1256   CHOLHDL 3.1 02/01/2018 1622   LDLCALC 123 (H) 08/28/2018 1256   Hepatic Function Panel     Component Value Date/Time   PROT 7.3 08/28/2018 1256   ALBUMIN 4.6 08/28/2018 1256   AST 19 08/28/2018 1256   ALT 24 08/28/2018 1256   ALKPHOS 70 08/28/2018 1256   BILITOT 0.3 08/28/2018 1256      Component Value Date/Time   TSH 2.810 05/16/2018 0952   TSH 3.570 02/01/2018 1622   TSH 1.940 05/20/2016 1011    Ref. Range 08/28/2018 12:56  Vitamin D, 25-Hydroxy Latest Ref Range: 30.0 - 100.0 ng/mL 42.2   OBESITY BEHAVIORAL INTERVENTION VISIT  Today's visit was #9   Starting weight: 207 lbs Starting date: 05/16/2018 Today's weight: 195 lbs Today's date: 09/25/2018 Total lbs lost to date: 12    09/25/2018  Height  (1.575 m)  Weight 195 lb (88.5 kg)  BMI (Calculated) 35.66  BLOOD PRESSURE - SYSTOLIC 114  BLOOD PRESSURE - DIASTOLIC 78   Body Fat % 45.2 %  Total Body Water (lbs) 74.8 lbs   ASK: We discussed the diagnosis of obesity with Andrea Alvarez today and Andrea Alvarez agreed to give [place] permission to discuss obesity behavioral modification therapy today.  ASSESS: Jennea has the diagnosis of obesity and her BMI today is 35.66. Andrea Alvarez is in the action stage of change.   ADVISE: Andrea Alvarez was educated on the multiple health risks of obesity as well as the benefit of weight loss to improve her health. She was advised of the need for long term treatment and the importance of lifestyle modifications to improve her current health and to decrease her risk of future health problems.  AGREE: Multiple dietary modification options and treatment options were discussed and  Andrea Alvarez agreed to follow the recommendations documented in the above note.  ARRANGE: Andrea Alvarez was educated on the importance of frequent visits to treat obesity as outlined per CMS and USPSTF guidelines and agreed to schedule her next  follow up appointment today.  IMarianna Payment, am acting as Energy manager for [employer]  , FNP-C.  I have reviewed the above documentation for accuracy and completeness, and I agree with the above.  -  , FNP-C.

## 2018-10-10 ENCOUNTER — Encounter (INDEPENDENT_AMBULATORY_CARE_PROVIDER_SITE_OTHER): Payer: Self-pay

## 2018-10-16 ENCOUNTER — Ambulatory Visit (INDEPENDENT_AMBULATORY_CARE_PROVIDER_SITE_OTHER): Payer: 59 | Admitting: Family Medicine

## 2019-01-25 ENCOUNTER — Other Ambulatory Visit: Payer: Self-pay

## 2019-01-25 ENCOUNTER — Ambulatory Visit (INDEPENDENT_AMBULATORY_CARE_PROVIDER_SITE_OTHER): Payer: 59 | Admitting: Physician Assistant

## 2019-01-25 ENCOUNTER — Encounter: Payer: Self-pay | Admitting: Physician Assistant

## 2019-01-25 VITALS — BP 135/89 | HR 88 | Temp 98.1°F | Resp 16 | Wt 210.0 lb

## 2019-01-25 DIAGNOSIS — L509 Urticaria, unspecified: Secondary | ICD-10-CM | POA: Diagnosis not present

## 2019-01-25 MED ORDER — PREDNISONE 20 MG PO TABS: 20.0000 mg | ORAL_TABLET | Freq: Every day | ORAL | 0 refills | Status: DC

## 2019-01-25 NOTE — Progress Notes (Signed)
Patient: Andrea RusselSharon M Alvarez Female    DOB: [birth year]   53 y.o.   MRN: [id number] Visit Date: 01/25/2019  Today's Provider: Margaretann LovelessJennifer M Fredrica Capano, PA-C   Chief Complaint  Patient presents with   Rash   Subjective:     Rash This is a new problem. The current episode started 1 to 4 weeks ago (2 weeks). The rash is characterized by redness, swelling and itchiness. Pertinent negatives include no anorexia, congestion, cough, diarrhea, eye pain, facial edema, fatigue, fever, joint pain, nail changes, rhinorrhea, shortness of breath, sore throat or vomiting. Treatments tried: benadryl. The treatment provided mild relief.   Patient states she has had a rash like hives for the past 2 weeks. Patient states rash will come up on different parts of her body and fade away after several hours. Rash is usually red, itchy and sometimes swells. Has been taking benadryl cream and tablets with mild relief.  No Known Allergies   Current Outpatient Medications:    naproxen sodium (ALEVE) 220 MG tablet, Take 440 mg by mouth 2 (two) times daily as needed., Disp: , Rfl:    metFORMIN (GLUCOPHAGE) 500 MG tablet, Take 1 tablet (500 mg total) by mouth 2 (two) times daily with a meal. (Patient not taking: Reported on 01/25/2019), Disp: 60 tablet, Rfl: 0   Vitamin D, Ergocalciferol, (DRISDOL) 1.25 MG (50000 UT) CAPS capsule, Take 1 capsule (50,000 Units total) by mouth every 7 (seven) days. (Patient not taking: Reported on 01/25/2019), Disp: 4 capsule, Rfl: 0  Current Facility-Administered Medications:    triamcinolone acetonide (KENALOG) 10 MG/ML injection 10 mg, 10 mg, Other, Once, Asencion IslamStover, [place], DPM  Review of Systems  Constitutional: Negative for fatigue and fever.  HENT: Negative for congestion, rhinorrhea and sore throat.   Eyes: Negative for pain.  Respiratory: Negative for cough and shortness of breath.   Gastrointestinal: Negative for anorexia, diarrhea and vomiting.  Musculoskeletal: Negative for  joint pain.  Skin: Positive for rash. Negative for nail changes.    Social History   Tobacco Use   Smoking status: Never Smoker   Smokeless tobacco: Never Used  Substance Use Topics   Alcohol use: Yes    Comment: Occasional alcohol use; once or twice a year      Objective:   BP 135/89 (BP Location: Left Arm, Patient Position: Sitting, Cuff Size: Large)    Pulse 88    Temp 98.1 F (36.7 C) (Oral)    Resp 16    Wt 210 lb (95.3 kg)    LMP 11/16/2016 (Approximate)    SpO2 95%    BMI 38.41 kg/m  Vitals:   01/25/19 1336  BP: 135/89  Pulse: 88  Resp: 16  Temp: 98.1 F (36.7 C)  TempSrc: Oral  SpO2: 95%  Weight: 210 lb (95.3 kg)     Physical Exam Vitals signs reviewed.  Constitutional:      General: She is not in acute distress.    Appearance: Normal appearance. She is well-developed. She is obese. She is not ill-appearing or diaphoretic.  Neck:     Musculoskeletal: Normal range of motion and neck supple.  Cardiovascular:     Rate and Rhythm: Normal rate and regular rhythm.     Heart sounds: Normal heart sounds. No murmur. No friction rub. No gallop.   Pulmonary:     Effort: Pulmonary effort is normal. No respiratory distress.     Breath sounds: Normal breath sounds. No wheezing or rales.  Skin:  Findings: Rash present. Rash is urticarial.       Neurological:     Mental Status: She is alert.      No results found for any visits on 01/25/19.     Assessment & Plan    1. Urticaria Symptoms c/w hives. May continue to use benadryl and pepcid prn. Will add prednisone as below. Call if worsening or not resolving.    I,April Miller,acting as a scribe for [employer], PA-C.,have documented all relevant documentation on the behalf of Mar Daring, PA-C,as directed by  Mar Daring, PA-C while in the presence of Mar Daring, PA-C.   Mar Daring, PA-C  [facility]

## 2019-01-25 NOTE — Patient Instructions (Addendum)
Black Cohosh for night sweats  Hives Hives (urticaria) are itchy, red, swollen areas on the skin. Hives can appear on any part of the body. Hives often fade within 24 hours (acute hives). Sometimes, new hives appear after old ones fade and the cycle can continue for several days or weeks (chronic hives). Hives do not spread from person to person (are not contagious). Hives come from the body's reaction to something a person is allergic to (allergen), something that causes irritation, or various other triggers. When a person is exposed to a trigger, his or her body releases a chemical (histamine) that causes redness, itching, and swelling. Hives can appear right after exposure to a trigger or hours later. What are the causes? This condition may be caused by:  Allergies to foods or ingredients.  Insect bites or stings.  Exposure to pollen or pets.  Contact with latex or chemicals.  Spending time in sunlight, heat, or cold (exposure).  Exercise.  Stress.  Certain medicines. You can also get hives from other medical conditions and treatments, such as:  Viruses, including the common cold.  Bacterial infections, such as urinary tract infections and strep throat.  Certain medicines.  Allergy shots.  Blood transfusions. Sometimes, the cause of this condition is not known (idiopathic hives). What increases the risk? You are more likely to develop this condition if you:  Are a woman.  Have food allergies, especially to citrus fruits, milk, eggs, peanuts, tree nuts, or shellfish.  Are allergic to: ? Medicines. ? Latex. ? Insects. ? Animals. ? Pollen. What are the signs or symptoms? Common symptoms of this condition include raised, itchy, red or white bumps or patches on your skin. These areas may:  Become large and swollen (welts).  Change in shape and location, quickly and repeatedly.  Be separate hives or connect over a large area of skin.  Sting or become painful.   Turn white when pressed in the center (blanch). In severe cases, yourhands, feet, and face may also become swollen. This may occur if hives develop deeper in your skin. How is this diagnosed? This condition may be diagnosed by your symptoms, medical history, and physical exam.  Your skin, urine, or blood may be tested to find out what is causing your hives and to rule out other health issues.  Your health care provider may also remove a small sample of skin from the affected area and examine it under a microscope (biopsy). How is this treated? Treatment for this condition depends on the cause and severity of your symptoms. Your health care provider may recommend using cool, wet cloths (cool compresses) or taking cool showers to relieve itching. Treatment may include:  Medicines that help: ? Relieve itching (antihistamines). ? Reduce swelling (corticosteroids). ? Treat infection (antibiotics).  An injectable medicine (omalizumab). Your health care provider may prescribe this if you have chronic idiopathic hives and you continue to have symptoms even after treatment with antihistamines. Severe cases may require an emergency injection of adrenaline (epinephrine) to prevent a life-threatening allergic reaction (anaphylaxis). Follow these instructions at home: Medicines  Take and apply over-the-counter and prescription medicines only as told by your health care provider.  If you were prescribed an antibiotic medicine, take it as told by your health care provider. Do not stop using the antibiotic even if you start to feel better. Skin care  Apply cool compresses to the affected areas.  Do not scratch or rub your skin. General instructions  Do not take hot showers  or baths. This can make itching worse.  Do not wear tight-fitting clothing.  Use sunscreen and wear protective clothing when you are outside.  Avoid any substances that cause your hives. Keep a journal to help track what  causes your hives. Write down: ? What medicines you take. ? What you eat and drink. ? What products you use on your skin.  Keep all follow-up visits as told by your health care provider. This is important. Contact a health care provider if:  Your symptoms are not controlled with medicine.  Your joints are painful or swollen. Get help right away if:  You have a fever.  You have pain in your abdomen.  Your tongue or lips are swollen.  Your eyelids are swollen.  Your chest or throat feels tight.  You have trouble breathing or swallowing. These symptoms may represent a serious problem that is an emergency. Do not wait to see if the symptoms will go away. Get medical help right away. Call your local emergency services (911 in the [place] Do not drive yourself to the hospital. Summary  Hives (urticaria) are itchy, red, swollen areas on your skin. Hives come from the body's reaction to something a person is allergic to (allergen), something that causes irritation, or various other triggers.  Treatment for this condition depends on the cause and severity of your symptoms.  Avoid any substances that cause your hives. Keep a journal to help track what causes your hives.  Take and apply over-the-counter and prescription medicines only as told by your health care provider.  Keep all follow-up visits as told by your health care provider. This is important. This information is not intended to replace advice given to you by your health care provider. Make sure you discuss any questions you have with your health care provider. Document Released: 07/05/2005 Document Revised: 01/18/2018 Document Reviewed: 01/18/2018 Elsevier Patient Education  2020 [employer]

## 2019-02-01 ENCOUNTER — Ambulatory Visit: Payer: Self-pay | Admitting: Physician Assistant

## 2019-02-02 ENCOUNTER — Ambulatory Visit: Payer: 59 | Admitting: Physician Assistant

## 2019-02-02 ENCOUNTER — Encounter: Payer: Self-pay | Admitting: Physician Assistant

## 2019-02-02 ENCOUNTER — Other Ambulatory Visit: Payer: Self-pay

## 2019-02-02 VITALS — BP 141/85 | HR 79 | Temp 98.4°F | Resp 16 | Wt 209.8 lb

## 2019-02-02 DIAGNOSIS — L509 Urticaria, unspecified: Secondary | ICD-10-CM | POA: Diagnosis not present

## 2019-02-02 DIAGNOSIS — Z6838 Body mass index (BMI) 38.0-38.9, adult: Secondary | ICD-10-CM

## 2019-02-02 MED ORDER — DOXEPIN HCL 10 MG PO CAPS: 10.0000 mg | ORAL_CAPSULE | Freq: Three times a day (TID) | ORAL | 0 refills | Status: DC

## 2019-02-02 NOTE — Patient Instructions (Signed)
Doxepin capsules What is this medicine? DOXEPIN (DOX e pin) is used to treat depression and anxiety. This medicine may be used for other purposes; ask your health care provider or pharmacist if you have questions. COMMON BRAND NAME(S): Sinequan What should I tell my health care provider before I take this medicine? They need to know if you have any of these conditions:  bipolar disorder  difficulty passing urine  glaucoma  heart disease  if you frequently drink alcohol containing drinks  liver disease  lung or breathing disease, like asthma or sleep apnea  prostate trouble  schizophrenia  seizures  suicidal thoughts, plans, or attempt; a previous suicide attempt by you or a family member  an unusual or allergic reaction to doxepin, other medicines, foods, dyes, or preservatives  pregnant or trying to get pregnant  breast-feeding How should I use this medicine? Take this medicine by mouth with a glass of water. Follow the directions on the prescription label. Take your doses at regular intervals. Do not take your medicine more often than directed. Do not stop taking this medicine suddenly except upon the advice of your doctor. Stopping this medicine too quickly may cause serious side effects or your condition may worsen. A special MedGuide will be given to you by the pharmacist with each prescription and refill. Be sure to read this information carefully each time. Talk to your pediatrician regarding the use of this medicine in children. While this drug may be prescribed for children as young as 12 years for selected conditions, precautions do apply. Overdosage: If you think you have taken too much of this medicine contact a poison control center or emergency room at once. NOTE: This medicine is only for you. Do not share this medicine with others. What if I miss a dose? If you miss a dose, take it as soon as you can. If it is almost time for your next dose, take only that  dose. Do not take double or extra doses. What may interact with this medicine? Do not take this medicine with any of the following medications:  arsenic trioxide  certain medicines used to regulate abnormal heartbeat or to treat other heart conditions  cisapride  halofantrine  levomethadyl  linezolid  MAOIs like Carbex, Eldepryl, Marplan, Nardil, and Parnate  methylene blue  other medicines for mental depression  phenothiazines like perphenazine, thioridazine and chlorpromazine  pimozide  procarbazine  sparfloxacin  St. John's Wort This medicine may also interact with the following medications:  cimetidine  tolazamide  ziprasidone This list may not describe all possible interactions. Give your health care provider a list of all the medicines, herbs, non-prescription drugs, or dietary supplements you use. Also tell them if you smoke, drink alcohol, or use illegal drugs. Some items may interact with your medicine. What should I watch for while using this medicine? Visit your doctor or health care professional for regular checks on your progress. It can take several days before you feel the full effect of this medicine. If you have been taking this medicine regularly for some time, do not suddenly stop taking it. You must gradually reduce the dose or you may get severe side effects. Ask your doctor or health care professional for advice. Even after you stop taking this medicine it can still affect your body for several days. Patients and their families should watch out for new or worsening thoughts of suicide or depression. Also watch out for sudden changes in feelings such as feeling anxious, agitated, panicky,   irritable, hostile, aggressive, impulsive, severely restless, overly excited and hyperactive, or not being able to sleep. If this happens, especially at the beginning of treatment or after a change in dose, call your health care professional. You may get drowsy or  dizzy. Do not drive, use machinery, or do anything that needs mental alertness until you know how this medicine affects you. Do not stand or sit up quickly, especially if you are an older patient. This reduces the risk of dizzy or fainting spells. Alcohol may increase dizziness and drowsiness. Avoid alcoholic drinks. Do not treat yourself for coughs, colds, or allergies without asking your doctor or health care professional for advice. Some ingredients can increase possible side effects. Your mouth may get dry. Chewing sugarless gum or sucking hard candy, and drinking plenty of water may help. Contact your doctor if the problem does not go away or is severe. This medicine may cause dry eyes and blurred vision. If you wear contact lenses you may feel some discomfort. Lubricating drops may help. See your eye doctor if the problem does not go away or is severe. This medicine can make you more sensitive to the sun. Keep out of the sun. If you cannot avoid being in the sun, wear protective clothing and use sunscreen. Do not use sun lamps or tanning beds/booths. What side effects may I notice from receiving this medicine? Side effects that you should report to your doctor or health care professional as soon as possible:  allergic reactions like skin rash, itching or hives, swelling of the face, lips, or tongue  anxious  breathing problems  changes in vision  confusion  elevated mood, decreased need for sleep, racing thoughts, impulsive behavior  eye pain  fast, irregular heartbeat  feeling faint or lightheaded, falls  feeling agitated, angry, or irritable  fever with increased sweating  hallucination, loss of contact with reality  seizures  stiff muscles  suicidal thoughts or other mood changes  tingling, pain, or numbness in the feet or hands  trouble passing urine or change in the amount of urine  trouble sleeping  unusually weak or tired  vomiting  yellowing of the eyes  or skin Side effects that usually do not require medical attention (report to your doctor or health care professional if they continue or are bothersome):  change in sex drive or performance  change in appetite or weight  constipation  dizziness  dry mouth  nausea  tired  tremors  upset stomach This list may not describe all possible side effects. Call your doctor for medical advice about side effects. You may report side effects to FDA at 1-800-FDA-1088. Where should I keep my medicine? Keep out of the reach of children. Store at room temperature between 15 and 30 degrees C (59 and 86 degrees F). Throw away any unused medicine after the expiration date. NOTE: This sheet is a summary. It may not cover all possible information. If you have questions about this medicine, talk to your doctor, pharmacist, or health care provider.  2020 Elsevier/Gold Standard (2018-06-27 13:13:29)  

## 2019-02-02 NOTE — Progress Notes (Signed)
Patient: Andrea RusselSharon M Alvarez Female    DOB: [birth year]   53 y.o.   MRN: [id number] Visit Date: 02/02/2019  Today's Provider: Margaretann LovelessJennifer M Burnette, PA-C   Chief Complaint  Patient presents with  . Rash   Subjective:    I,Andrea Alvarez,RMA am acting as a Neurosurgeonscribe for PPG IndustriesJennifer M. Burnette, PA-C.  HPI  Patient here with c/o hives getting worse. She reports that she finished the prednisone. She tooked 2 Benadryl last night to help her sleep. She reports that at first they were appearing and going away but now they are just staying specifically the ones on the flanks/lateral hips.  No Known Allergies   Current Outpatient Medications:  .  naproxen sodium (ALEVE) 220 MG tablet, Take 440 mg by mouth 2 (two) times daily as needed., Disp: , Rfl:  .  metFORMIN (GLUCOPHAGE) 500 MG tablet, Take 1 tablet (500 mg total) by mouth 2 (two) times daily with a meal. (Patient not taking: Reported on 01/25/2019), Disp: 60 tablet, Rfl: 0 .  predniSONE (DELTASONE) 20 MG tablet, Take 1 tablet (20 mg total) by mouth daily with breakfast. (Patient not taking: Reported on 02/02/2019), Disp: 5 tablet, Rfl: 0 .  Vitamin D, Ergocalciferol, (DRISDOL) 1.25 MG (50000 UT) CAPS capsule, Take 1 capsule (50,000 Units total) by mouth every 7 (seven) days. (Patient not taking: Reported on 01/25/2019), Disp: 4 capsule, Rfl: 0  Current Facility-Administered Medications:  .  triamcinolone acetonide (KENALOG) 10 MG/ML injection 10 mg, 10 mg, Other, Once, Stover, Nelchinaitorya, DPM  Review of Systems  Constitutional: Positive for fatigue ("can't sleep because she is itching"and taking 2 benadryl last night).  Cardiovascular: Negative for chest pain.  Musculoskeletal: Negative for joint swelling.  Skin: Positive for rash.  Neurological: Positive for headaches ("This morning" thinks is from the Benadryl.).    Social History   Tobacco Use  . Smoking status: Never Smoker  . Smokeless tobacco: Never Used  Substance Use Topics  .  Alcohol use: Yes    Comment: Occasional alcohol use; once or twice a year      Objective:   BP (!) 141/85 (BP Location: Left Arm, Patient Position: Sitting, Cuff Size: Large)   Pulse 79   Temp 98.4 F (36.9 C) (Oral)   Resp 16   Wt 209 lb 12.8 oz (95.2 kg)   LMP 11/16/2016 (Approximate)   BMI 38.37 kg/m  Vitals:   02/02/19 1039  BP: (!) 141/85  Pulse: 79  Resp: 16  Temp: 98.4 F (36.9 C)  TempSrc: Oral  Weight: 209 lb 12.8 oz (95.2 kg)     Physical Exam Vitals signs reviewed.  Constitutional:      General: She is not in acute distress.    Appearance: Normal appearance. She is well-developed. She is obese. She is not ill-appearing.  HENT:     Head: Normocephalic and atraumatic.  Neck:     Musculoskeletal: Normal range of motion and neck supple.  Pulmonary:     Effort: Pulmonary effort is normal. No respiratory distress.  Skin:    General: Skin is warm.     Findings: Rash present. Rash is urticarial (diffuse on trunk).       Neurological:     Mental Status: She is alert.  Psychiatric:        Mood and Affect: Mood normal.        Behavior: Behavior normal.        Thought Content: Thought content normal.  Judgment: Judgment normal.      No results found for any visits on 02/02/19.     Assessment & Plan    1. Class 2 severe obesity due to excess calories with serious comorbidity and body mass index (BMI) of 38.0 to 38.9 in adult [facility]) Counseled patient on healthy lifestyle modifications including dieting and exercise.   2. Urticaria Failed Prednisone 10mg  once daily x 5 days. Will start doxepin as below. Call by Tuesday next week if not improving and will check labs.  - doxepin (SINEQUAN) 10 MG capsule; Take 1 capsule (10 mg total) by mouth 4 (four) times daily - after meals and at bedtime.  Dispense: 120 capsule; Refill: 0     Mar Daring, PA-C  [facility]

## 2019-03-01 ENCOUNTER — Other Ambulatory Visit: Payer: Self-pay | Admitting: Physician Assistant

## 2019-03-01 DIAGNOSIS — L509 Urticaria, unspecified: Secondary | ICD-10-CM

## 2019-06-29 NOTE — Progress Notes (Signed)
Patient: Andrea Alvarez, Female    DOB: 1966/01/20, 53 y.o.   MRN: [id number] Visit Date: 07/02/2019  Today's Provider: [name], PA-C   Chief Complaint  Patient presents with  . Annual Exam   Subjective:     Annual physical exam Andrea Alvarez is a 53 y.o. female who presents today for health maintenance and complete physical. She feels fairly well. She reports exercising some, walking. She reports she is sleeping well.  She reports worsening increase thirst for the past 4 months. She no longer taking the Metformin that was prescribed by the nutritionist.  02/01/18-Pap is normal and HPV negative. Will repeat in 5 years. 03/30/18-Normal mammogram. Repeat screening in one year. 07/15/16-Colonoscopy   Review of Systems  Constitutional: Negative.   HENT: Positive for sinus pressure.   Eyes: Negative.   Respiratory: Negative.   Cardiovascular: Negative.   Gastrointestinal: Negative.   Endocrine: Positive for polydipsia.  Genitourinary: Negative.   Musculoskeletal: Negative.   Skin: Negative.   Allergic/Immunologic: Negative.   Neurological: Negative.   Hematological: Negative.   Psychiatric/Behavioral: Negative.        Social History      She  reports that she has never smoked. She has never used smokeless tobacco. She reports current alcohol use. She reports that she does not use drugs.       Social History   Socioeconomic History  . Marital status: Married    Spouse name: Shizuye Rupert  . Number of children: Not on file  . Years of education: Not on file  . Highest education level: Not on file  Occupational History  . Occupation: Investment banker, operational  Tobacco Use  . Smoking status: Never Smoker  . Smokeless tobacco: Never Used  Substance and Sexual Activity  . Alcohol use: Yes    Comment: Occasional alcohol use; once or twice a year  . Drug use: No  . Sexual activity: Not on file  Other Topics Concern  . Not on file    Social History Narrative  . Not on file   Social Determinants of Health   Financial Resource Strain:   . Difficulty of Paying Living Expenses: Not on file  Food Insecurity:   . Worried About Programme researcher, broadcasting/film/video in the Last Year: Not on file  . Ran Out of Food in the Last Year: Not on file  Transportation Needs:   . Lack of Transportation (Medical): Not on file  . Lack of Transportation (Non-Medical): Not on file  Physical Activity:   . Days of Exercise per Week: Not on file  . Minutes of Exercise per Session: Not on file  Stress:   . Feeling of Stress : Not on file  Social Connections:   . Frequency of Communication with Friends and Family: Not on file  . Frequency of Social Gatherings with Friends and Family: Not on file  . Attends Religious Services: Not on file  . Active Member of Clubs or Organizations: Not on file  . Attends Banker Meetings: Not on file  . Marital Status: Not on file    Past Medical History:  Diagnosis Date  . Anemia   . Back pain   . Constipation   . GERD (gastroesophageal reflux disease)   . Joint pain   . Leg edema   . Obesity   . Vitamin D deficiency      Patient Active Problem List   Diagnosis Date Noted  .  Insulin resistance 08/28/2018  . Other hyperlipidemia 08/14/2018  . Vitamin D deficiency 07/27/2018  . BMI 35.0-35.9,adult 03/25/2016  . Abdominal pain, right upper quadrant 05/13/2015  . 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 Acid reflux 05/13/2015  . LBP (low back pain) 05/13/2015  . Headache, migraine 05/13/2015  . Night sweat 05/13/2015  . Class 2 severe obesity with serious comorbidity and body mass index (BMI) of 36.0 to 36.9 in adult [facility]) 05/13/2015  . Fecal occult blood test positive 05/13/2015    Past Surgical History:  Procedure Laterality Date  . BREAST SURGERY  02/1999   status post bilateral breast reduction  . 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 REDUCTION MAMMAPLASTY  Bilateral 1999  . TUBAL LIGATION      Family History        Family Status  Relation Name Status  . Mother  Alive       Melanoma  . Father  Deceased  . Sister  Alive  . MGM  Deceased at age 768       died from an MI  . PGM  Deceased  . Neg Hx  (Not Specified)        Her family history includes Asthma in her paternal grandmother and sister; Cancer in her father and mother; Coronary artery disease in her maternal grandmother and paternal grandmother; Heart attack in her paternal grandmother; Heart disease in her sister; Hypertension in her mother. There is no history of Colon cancer, Colon polyps, Esophageal cancer, Rectal cancer, or Stomach cancer.      No Known Allergies   Current Outpatient Medications:  .  doxepin (SINEQUAN) 10 MG capsule, TAKE 1 CAPSULE(10 MG) BY MOUTH FOUR TIMES DAILY AFTER MEALS AND AT BEDTIME, Disp: 120 capsule, Rfl: 0 .  naproxen sodium (ALEVE) 220 MG tablet, Take 440 mg by mouth 2 (two) times daily as needed., Disp: , Rfl:   Current Facility-Administered Medications:  .  triamcinolone acetonide (KENALOG) 10 MG/ML injection 10 mg, 10 mg, Other, Once, Asencion IslamStover, Titorya, DPM   Patient Care Team: Reine JustBurnette, Alize Acy M, PA-C as PCP - General (Family Medicine)    Objective:    Vitals: BP (!) 150/84 (BP Location: Left Arm, Patient Position: Sitting, Cuff Size: Large) Comment: 138/80 manual  Pulse 85   Temp (!) 97 F (36.1 C) (Temporal)   Resp 16   Ht 5\' 2"  (1.575 m)   Wt 213 lb 9.6 oz (96.9 kg)   LMP 11/16/2016 (Approximate)   BMI 39.07 kg/m    Vitals:   07/02/19 1014  BP: (!) 150/84  Pulse: 85  Resp: 16  Temp: (!) 97 F (36.1 C)  TempSrc: Temporal  Weight: 213 lb 9.6 oz (96.9 kg)  Height: 5\' 2"  (1.575 m)     Physical Exam Vitals reviewed.  Constitutional:      General: She is not in acute distress.    Appearance: Normal appearance. She is well-developed. She is obese. She is not ill-appearing or diaphoretic.  HENT:     Head: Normocephalic  and atraumatic.     Right Ear: Tympanic membrane, ear canal and external ear normal.     Left Ear: Tympanic membrane, ear canal and external ear normal.     Nose: Nose normal. No congestion.  Eyes:     General: No scleral icterus.       Right eye: No discharge.        Left eye: No discharge.     Extraocular Movements: Extraocular  movements intact.     Conjunctiva/sclera: Conjunctivae normal.     Pupils: Pupils are equal, round, and reactive to light.  Neck:     Thyroid: No thyromegaly.     Vascular: No carotid bruit or JVD.     Trachea: No tracheal deviation.  Cardiovascular:     Rate and Rhythm: Normal rate and regular rhythm.     Pulses: Normal pulses.     Heart sounds: Normal heart sounds. No murmur. No friction rub. No gallop.   Pulmonary:     Effort: Pulmonary effort is normal. No respiratory distress.     Breath sounds: Normal breath sounds. No wheezing or rales.  Chest:     Chest wall: No tenderness.     Breasts:        Right: Normal. No mass, nipple discharge, skin change or tenderness.        Left: Normal. No mass, nipple discharge, skin change or tenderness.     Comments: Well healed keyhole scars from breast augmentation Abdominal:     General: Abdomen is flat. Bowel sounds are normal. There is no distension.     Palpations: Abdomen is soft. There is no mass.     Tenderness: There is no abdominal tenderness. There is no guarding or rebound.  Musculoskeletal:        General: No tenderness. Normal range of motion.     Cervical back: Normal range of motion and neck supple.     Right lower leg: No edema.     Left lower leg: No edema.  Lymphadenopathy:     Cervical: No cervical adenopathy.     Upper Body:     Right upper body: No supraclavicular, axillary or pectoral adenopathy.     Left upper body: No supraclavicular, axillary or pectoral adenopathy.  Skin:    General: Skin is warm and dry.     Capillary Refill: Capillary refill takes less than 2 seconds.      Findings: No rash.  Neurological:     General: No focal deficit present.     Mental Status: She is alert and oriented to person, place, and time. Mental status is at baseline.  Psychiatric:        Mood and Affect: Mood normal.        Behavior: Behavior normal.        Thought Content: Thought content normal.        Judgment: Judgment normal.      Depression Screen PHQ 2/9 Scores 07/02/2019 05/16/2018 02/01/2018 05/20/2016  PHQ - 2 Score 0 1 0 0  PHQ- 9 Score - 8 - -       Assessment & Plan:     Routine Health Maintenance and Physical Exam  Exercise Activities and Dietary recommendations Goals   None     Immunization History  Administered Date(s) Administered  . Influenza Split 05/13/2010  . Influenza,inj,Quad PF,6+ Mos 05/13/2014, 05/16/2015, 04/23/2016, 04/28/2018, 07/02/2019  . Tdap 05/13/2014    Health Maintenance  Topic Date Due  . MAMMOGRAM  03/29/2020  . PAP SMEAR-Modifier  02/02/2023  . TETANUS/TDAP  05/13/2024  . COLONOSCOPY  07/15/2026  . INFLUENZA VACCINE  Completed  . HIV Screening  Completed     Discussed health benefits of physical activity, and encouraged her to engage in regular exercise appropriate for her age and condition.    1. Encounter for annual physical exam Normal physical exam today. Will check labs as below and f/u pending lab results. If labs are  stable and WNL she will not need to have these rechecked for one year at her next annual physical exam. She is to call the office in the meantime if she has any acute issue, questions or concerns. - CBC w/Diff - Comprehensive Metabolic Panel (CMET) - HgB A1c - Lipid Profile - TSH  2. Encounter for screening mammogram for breast cancer Breast exam today was normal. There is no family history of breast cancer. She does perform regular self breast exams. Mammogram was ordered as below. Information for [facility] Breast clinic was given to patient so she may schedule her mammogram at her  convenience. - MM 3D SCREEN BREAST BILATERAL; Future  3. Mixed hyperlipidemia Stable. Diet controlled. Will check labs as below and f/u pending results. - CBC w/Diff - Comprehensive Metabolic Panel (CMET) - HgB A1c - Lipid Profile  4. Polydipsia UA normal. Will check labs as below. - HgB A1c - TSH  5. Class 2 severe obesity due to excess calories with serious comorbidity and body mass index (BMI) of 39.0 to 39.9 in adult [facility]) Counseled patient on healthy lifestyle modifications including dieting and exercise.   6. Need for influenza vaccination Flu vaccine given today without complication. Patient sat upright for 15 minutes to check for adverse reaction before being released. - Flu Vaccine QUAD 36+ mos PF IM (Fluarix & Fluzone Quad PF)  --------------------------------------------------------------------    [name], PA-C  [facility]

## 2019-07-02 ENCOUNTER — Other Ambulatory Visit: Payer: Self-pay

## 2019-07-02 ENCOUNTER — Encounter: Payer: Self-pay | Admitting: Physician Assistant

## 2019-07-02 ENCOUNTER — Ambulatory Visit (INDEPENDENT_AMBULATORY_CARE_PROVIDER_SITE_OTHER): Payer: 59 | Admitting: Physician Assistant

## 2019-07-02 ENCOUNTER — Telehealth: Payer: Self-pay | Admitting: Physician Assistant

## 2019-07-02 VITALS — BP 150/84 | HR 85 | Temp 97.0°F | Resp 16 | Ht 62.0 in | Wt 213.6 lb

## 2019-07-02 DIAGNOSIS — E782 Mixed hyperlipidemia: Secondary | ICD-10-CM

## 2019-07-02 DIAGNOSIS — Z0000 Encounter for general adult medical examination without abnormal findings: Secondary | ICD-10-CM | POA: Diagnosis not present

## 2019-07-02 DIAGNOSIS — Z1231 Encounter for screening mammogram for malignant neoplasm of breast: Secondary | ICD-10-CM | POA: Diagnosis not present

## 2019-07-02 DIAGNOSIS — Z23 Encounter for immunization: Secondary | ICD-10-CM | POA: Diagnosis not present

## 2019-07-02 DIAGNOSIS — Z6839 Body mass index (BMI) 39.0-39.9, adult: Secondary | ICD-10-CM

## 2019-07-02 DIAGNOSIS — R631 Polydipsia: Secondary | ICD-10-CM | POA: Diagnosis not present

## 2019-07-02 LAB — POCT URINALYSIS DIPSTICK
Bilirubin, UA: NEGATIVE
Blood, UA: NEGATIVE
Glucose, UA: NEGATIVE
Ketones, UA: NEGATIVE
Leukocytes, UA: NEGATIVE
Nitrite, UA: NEGATIVE
Protein, UA: NEGATIVE
Spec Grav, UA: 1.015 (ref 1.010–1.025)
Urobilinogen, UA: 0.2 E.U./dL
pH, UA: 6 (ref 5.0–8.0)

## 2019-07-02 NOTE — Telephone Encounter (Signed)
She has to quarantine from the day of testing for the person she was exposed to, which would be today for 14 days, which would be 07/16/19. If her sister is positive, she should be tested in 5 days from exposure, 12/04/18. She will have to make an appointment and this can be done by going to [email or web address] or by texting "COVID" to [phone].

## 2019-07-02 NOTE — Patient Instructions (Signed)
Health Maintenance, Female Adopting a healthy lifestyle and getting preventive care are important in promoting health and wellness. Ask your health care provider about:  The right schedule for you to have regular tests and exams.  Things you can do on your own to prevent diseases and keep yourself healthy. What should I know about diet, weight, and exercise? Eat a healthy diet   Eat a diet that includes plenty of vegetables, fruits, low-fat dairy products, and lean protein.  Do not eat a lot of foods that are high in solid fats, added sugars, or sodium. Maintain a healthy weight Body mass index (BMI) is used to identify weight problems. It estimates body fat based on height and weight. Your health care provider can help determine your BMI and help you achieve or maintain a healthy weight. Get regular exercise Get regular exercise. This is one of the most important things you can do for your health. Most adults should:  Exercise for at least 150 minutes each week. The exercise should increase your heart rate and make you sweat (moderate-intensity exercise).  Do strengthening exercises at least twice a week. This is in addition to the moderate-intensity exercise.  Spend less time sitting. Even light physical activity can be beneficial. Watch cholesterol and blood lipids Have your blood tested for lipids and cholesterol at 53 years of age, then have this test every 5 years. Have your cholesterol levels checked more often if:  Your lipid or cholesterol levels are high.  You are older than 53 years of age.  You are at high risk for heart disease. What should I know about cancer screening? Depending on your health history and family history, you may need to have cancer screening at various ages. This may include screening for:  Breast cancer.  Cervical cancer.  Colorectal cancer.  Skin cancer.  Lung cancer. What should I know about heart disease, diabetes, and high blood  pressure? Blood pressure and heart disease  High blood pressure causes heart disease and increases the risk of stroke. This is more likely to develop in people who have high blood pressure readings, are of African descent, or are overweight.  Have your blood pressure checked: ? Every 3-5 years if you are 18-39 years of age. ? Every year if you are 40 years old or older. Diabetes Have regular diabetes screenings. This checks your fasting blood sugar level. Have the screening done:  Once every three years after age 40 if you are at a normal weight and have a low risk for diabetes.  More often and at a younger age if you are overweight or have a high risk for diabetes. What should I know about preventing infection? Hepatitis B If you have a higher risk for hepatitis B, you should be screened for this virus. Talk with your health care provider to find out if you are at risk for hepatitis B infection. Hepatitis C Testing is recommended for:  Everyone born from 1945 through 1965.  Anyone with known risk factors for hepatitis C. Sexually transmitted infections (STIs)  Get screened for STIs, including gonorrhea and chlamydia, if: ? You are sexually active and are younger than 53 years of age. ? You are older than 53 years of age and your health care provider tells you that you are at risk for this type of infection. ? Your sexual activity has changed since you were last screened, and you are at increased risk for chlamydia or gonorrhea. Ask your health care provider if   you are at risk.  Ask your health care provider about whether you are at high risk for HIV. Your health care provider may recommend a prescription medicine to help prevent HIV infection. If you choose to take medicine to prevent HIV, you should first get tested for HIV. You should then be tested every 3 months for as long as you are taking the medicine. Pregnancy  If you are about to stop having your period (premenopausal) and  you may become pregnant, seek counseling before you get pregnant.  Take 400 to 800 micrograms (mcg) of folic acid every day if you become pregnant.  Ask for birth control (contraception) if you want to prevent pregnancy. Osteoporosis and menopause Osteoporosis is a disease in which the bones lose minerals and strength with aging. This can result in bone fractures. If you are 65 years old or older, or if you are at risk for osteoporosis and fractures, ask your health care provider if you should:  Be screened for bone loss.  Take a calcium or vitamin D supplement to lower your risk of fractures.  Be given hormone replacement therapy (HRT) to treat symptoms of menopause. Follow these instructions at home: Lifestyle  Do not use any products that contain nicotine or tobacco, such as cigarettes, e-cigarettes, and chewing tobacco. If you need help quitting, ask your health care provider.  Do not use street drugs.  Do not share needles.  Ask your health care provider for help if you need support or information about quitting drugs. Alcohol use  Do not drink alcohol if: ? Your health care provider tells you not to drink. ? You are pregnant, may be pregnant, or are planning to become pregnant.  If you drink alcohol: ? Limit how much you use to 0-1 drink a day. ? Limit intake if you are breastfeeding.  Be aware of how much alcohol is in your drink. In the [place] one drink equals one 12 oz bottle of beer (355 mL), one 5 oz glass of wine (148 mL), or one 1 oz glass of hard liquor (44 mL). General instructions  Schedule regular health, dental, and eye exams.  Stay current with your vaccines.  Tell your health care provider if: ? You often feel depressed. ? You have ever been abused or do not feel safe at home. Summary  Adopting a healthy lifestyle and getting preventive care are important in promoting health and wellness.  Follow your health care provider's instructions about healthy  diet, exercising, and getting tested or screened for diseases.  Follow your health care provider's instructions on monitoring your cholesterol and blood pressure. This information is not intended to replace advice given to you by your health care provider. Make sure you discuss any questions you have with your health care provider. Document Released: 01/18/2011 Document Revised: 06/28/2018 Document Reviewed: 06/28/2018 Elsevier Patient Education  2020 Elsevier Inc.  

## 2019-07-02 NOTE — Telephone Encounter (Signed)
Copied from [place] [phone]. Topic: General - Call Back - No Documentation >> Jul 02, 2019 12:52 PM Erick Blinks wrote: Reason for CRM: Pt has a possible positive exposure, please advise. Pt had a CPE this morning in office Best contact: [phone]

## 2019-07-02 NOTE — Telephone Encounter (Signed)
Per patient her sister woke up today with symptoms and was sent to get tested for COVID. Patient was with her sister all weekend.

## 2019-07-03 ENCOUNTER — Telehealth: Payer: Self-pay

## 2019-07-03 LAB — CBC WITH DIFFERENTIAL/PLATELET
Basophils Absolute: 0.1 10*3/uL (ref 0.0–0.2)
Basos: 1 %
EOS (ABSOLUTE): 0.1 10*3/uL (ref 0.0–0.4)
Eos: 2 %
Hematocrit: 41.6 % (ref 34.0–46.6)
Hemoglobin: 13.8 g/dL (ref 11.1–15.9)
Immature Grans (Abs): 0 10*3/uL (ref 0.0–0.1)
Immature Granulocytes: 0 %
Lymphocytes Absolute: 1.9 10*3/uL (ref 0.7–3.1)
Lymphs: 31 %
MCH: 29.9 pg (ref 26.6–33.0)
MCHC: 33.2 g/dL (ref 31.5–35.7)
MCV: 90 fL (ref 79–97)
Monocytes Absolute: 0.5 10*3/uL (ref 0.1–0.9)
Monocytes: 8 %
Neutrophils Absolute: 3.5 10*3/uL (ref 1.4–7.0)
Neutrophils: 58 %
Platelets: 346 10*3/uL (ref 150–450)
RBC: 4.62 x10E6/uL (ref 3.77–5.28)
RDW: 12.8 % (ref 11.7–15.4)
WBC: 6.1 10*3/uL (ref 3.4–10.8)

## 2019-07-03 LAB — COMPREHENSIVE METABOLIC PANEL
ALT: 27 IU/L (ref 0–32)
AST: 21 IU/L (ref 0–40)
Albumin/Globulin Ratio: 1.5 (ref 1.2–2.2)
Albumin: 4.5 g/dL (ref 3.8–4.9)
Alkaline Phosphatase: 93 IU/L (ref 39–117)
BUN/Creatinine Ratio: 19 (ref 9–23)
BUN: 14 mg/dL (ref 6–24)
Bilirubin Total: 0.4 mg/dL (ref 0.0–1.2)
CO2: 22 mmol/L (ref 20–29)
Calcium: 9.9 mg/dL (ref 8.7–10.2)
Chloride: 100 mmol/L (ref 96–106)
Creatinine, Ser: 0.75 mg/dL (ref 0.57–1.00)
GFR calc Af Amer: 105 mL/min/{1.73_m2} (ref >59)
GFR calc non Af Amer: 91 mL/min/{1.73_m2} (ref >59)
Globulin, Total: 3 g/dL (ref 1.5–4.5)
Glucose: 87 mg/dL (ref 65–99)
Potassium: 4.2 mmol/L (ref 3.5–5.2)
Sodium: 139 mmol/L (ref 134–144)
Total Protein: 7.5 g/dL (ref 6.0–8.5)

## 2019-07-03 LAB — LIPID PANEL
Chol/HDL Ratio: 2.9 ratio (ref 0.0–4.4)
Cholesterol, Total: 225 mg/dL — ABNORMAL HIGH (ref 100–199)
HDL: 78 mg/dL (ref >39)
LDL Chol Calc (NIH): 133 mg/dL — ABNORMAL HIGH (ref 0–99)
Triglycerides: 78 mg/dL (ref 0–149)
VLDL Cholesterol Cal: 14 mg/dL (ref 5–40)

## 2019-07-03 LAB — TSH: TSH: 2.34 u[IU]/mL (ref 0.450–4.500)

## 2019-07-03 LAB — HEMOGLOBIN A1C
Est. average glucose Bld gHb Est-mCnc: 105 mg/dL
Hgb A1c MFr Bld: 5.3 % (ref 4.8–5.6)

## 2019-07-03 NOTE — Telephone Encounter (Signed)
Patient advised as directed below. 

## 2019-07-03 NOTE — Telephone Encounter (Signed)
LMTCB  Ok for PEC to [employer].

## 2019-07-03 NOTE — Telephone Encounter (Signed)
-----   Message from Mar Daring, [place] sent at 07/03/2019  1:53 PM EST ----- Blood count is normal. Kidney and liver function is normal. A1c/sugar is normal. Cholesterol is up slightly compared to last year. Currently your risk of having a cardiovascular event over the next 10 years is low at 1.8% chance. No need for cholesterol lowering medication at this time. However, working on lifestyle modifications with limiting fatty foods, processed foods, red meats and sugars will help. Also increasing exercise to get in 150 min of moderate activity per week will help. Thyroid is normal.

## 2019-07-03 NOTE — Telephone Encounter (Signed)
Message read to patient, verbalizes understanding. ?

## 2019-07-05 ENCOUNTER — Ambulatory Visit: Payer: 59 | Attending: Internal Medicine

## 2019-07-05 DIAGNOSIS — U071 COVID-19: Secondary | ICD-10-CM

## 2019-07-06 LAB — NOVEL CORONAVIRUS, NAA: SARS-CoV-2, NAA: NOT DETECTED

## 2019-08-05 ENCOUNTER — Telehealth: Payer: 59 | Admitting: Physician Assistant

## 2019-08-05 DIAGNOSIS — J069 Acute upper respiratory infection, unspecified: Secondary | ICD-10-CM

## 2019-08-05 NOTE — Progress Notes (Signed)
E-Visit for Corona Virus Screening  Your current symptoms could be consistent with the coronavirus.  Many health care providers can now test patients at their office but not all are.  [facility] has multiple testing sites. For information on our COVID testing locations and hours go to [email or web address]  We are enrolling you in our MyChart Home Monitoring for COVID19 . Daily you will receive a questionnaire within the MyChart website. Our COVID 19 response team will be monitoring your responses daily.  Testing Information: The COVID-19 Community Testing sites will begin testing BY APPOINTMENT ONLY.  You can schedule online at [email or web address]  If you do not have access to a smart phone or computer you may call [phone] for an appointment.   Additional testing sites in the Community:  . For [facility] Testing sites in [place]  https://www.cvs.com/minuteclinic/covid-19-testing  . For Pop-up testing sites in [place]  https://covid19.ncdhhs.gov/about-covid-19/testing/find-my-testing-place/pop-testing-sites  . For Testing sites with regular hours [email or web address]  . For [place] [facility] [email or web address]://[email or web address]-19-community-outreach-testing/  . For [facility] https://[place].gov[place]/human-services/health-department/coronavirus-covid-19-info/covid-19-testing  . For [place] testing in [place] and [place] https://www.[place].gov/[place]/human-services/health-department/coronavirus-covid-19-info/covid-19-testing  . For [facility] testing in [place]   [email or web address].care/[email or web address]  For  more information about community testing call [phone]   Please quarantine yourself while awaiting your test results. Please stay home for a minimum of 10 days from the first day of illness with improving symptoms and you have had 24 hours of no fever (without the use of Tylenol (Acetaminophen)  Motrin (Ibuprofen) or any fever reducing medication).  Also - Do not get tested prior to returning to work because once you have had a positive test the test can stay positive for more then a month in some cases.   You should wear a mask or cloth face covering over your nose and mouth if you must be around other people or animals, including pets (even at home). Try to stay at least 6 feet away from other people. This will protect the people around you.  Please continue good preventive care measures, including:  frequent hand-washing, avoid touching your face, cover coughs/sneezes, stay out of crowds and keep a 6 foot distance from others.  COVID-19 is a respiratory illness with symptoms that are similar to the flu. Symptoms are typically mild to moderate, but there have been cases of severe illness and death due to the virus.   The following symptoms may appear 2-14 days after exposure: . Fever . Cough . Shortness of breath or difficulty breathing . Chills . Repeated shaking with chills . Muscle pain . Headache . Sore throat . New loss of taste or smell . Fatigue . Congestion or runny nose . Nausea or vomiting . Diarrhea  Go to the nearest hospital ED for assessment if fever/cough/breathlessness are severe or illness seems like a threat to life.  It is vitally important that if you feel that you have an infection such as this virus or any other virus that you stay home and away from places where you may spread it to others.  You should avoid contact with people age 65 and older.    You may also take acetaminophen (Tylenol) as needed for fever.  Reduce your risk of any infection by using the same precautions used for avoiding the common cold or flu:  . Wash your hands often with soap and warm water for at least 20 seconds.  If soap and water are not readily available, use an alcohol-based hand sanitizer with at   least 60% alcohol.  . If coughing or sneezing, cover your mouth and nose by  coughing or sneezing into the elbow areas of your shirt or coat, into a tissue or into your sleeve (not your hands). . Avoid shaking hands with others and consider head nods or verbal greetings only. . Avoid touching your eyes, nose, or mouth with unwashed hands.  . Avoid close contact with people who are sick. . Avoid places or events with large numbers of people in one location, like concerts or sporting events. . Carefully consider travel plans you have or are making. . If you are planning any travel outside or inside the [place], visit the CDC's Travelers' Health webpage for the latest health notices. . If you have some symptoms but not all symptoms, continue to monitor at home and seek medical attention if your symptoms worsen. . If you are having a medical emergency, call 911.  HOME CARE . Only take medications as instructed by your medical team. . Drink plenty of fluids and get plenty of rest. . A steam or ultrasonic humidifier can help if you have congestion.   GET HELP RIGHT AWAY IF YOU HAVE EMERGENCY WARNING SIGNS** FOR COVID-19. If you or someone is showing any of these signs seek emergency medical care immediately. Call 911 or proceed to your closest emergency facility if: . You develop worsening high fever. . Trouble breathing . Bluish lips or face . Persistent pain or pressure in the chest . New confusion . Inability to wake or stay awake . You cough up blood. . Your symptoms become more severe  **This list is not all possible symptoms. Contact your medical provider for any symptoms that are sever or concerning to you.  MAKE SURE YOU   Understand these instructions.  Will watch your condition.  Will get help right away if you are not doing well or get worse.  Your e-visit answers were reviewed by a board certified advanced clinical practitioner to complete your personal care plan.  Depending on the condition, your plan could have included both over the counter or  prescription medications.  If there is a problem please reply once you have received a response from your provider.  Your safety is important to us.  If you have drug allergies check your prescription carefully.    You can use MyChart to ask questions about today's visit, request a non-urgent call back, or ask for a work or school excuse for 24 hours related to this e-Visit. If it has been greater than 24 hours you will need to follow up with your provider, or enter a new e-Visit to address those concerns. You will get an e-mail in the next two days asking about your experience.  I hope that your e-visit has been valuable and will speed your recovery. Thank you for using e-visits.    

## 2019-08-05 NOTE — Progress Notes (Signed)
I have spent 5 minutes in review of e-visit questionnaire, review and updating patient chart, medical decision making and response to patient.   Carmita Boom Cody Nivaan Dicenzo, PA-C    

## 2019-08-06 ENCOUNTER — Ambulatory Visit: Payer: 59 | Attending: Internal Medicine

## 2019-08-06 DIAGNOSIS — Z20822 Contact with and (suspected) exposure to covid-19: Secondary | ICD-10-CM

## 2019-08-07 LAB — NOVEL CORONAVIRUS, NAA: SARS-CoV-2, NAA: DETECTED — AB

## 2019-08-08 ENCOUNTER — Telehealth: Payer: Self-pay | Admitting: Pulmonary Disease

## 2019-08-08 NOTE — Telephone Encounter (Signed)
  08/08/2019    I attempted to reach the patient regarding recent Covid diagnosis as well as opportunity for monoclonal antibody infusion.  It does look like the patient would qualify based off of BMI.  I have left a message with the telephone number: [phone] for the patient to call us back if they are interested.  I will also send a MyChart message detailing this as well.   Coral Ceo, NP

## 2019-08-14 ENCOUNTER — Ambulatory Visit: Payer: 59 | Attending: Internal Medicine

## 2019-08-14 DIAGNOSIS — Z20822 Contact with and (suspected) exposure to covid-19: Secondary | ICD-10-CM

## 2019-08-15 LAB — NOVEL CORONAVIRUS, NAA: SARS-CoV-2, NAA: NOT DETECTED

## 2019-10-29 ENCOUNTER — Ambulatory Visit: Payer: 59 | Attending: Internal Medicine

## 2019-10-29 DIAGNOSIS — Z23 Encounter for immunization: Secondary | ICD-10-CM

## 2019-10-29 NOTE — Progress Notes (Signed)
   Covid-19 Vaccination Clinic  Name:  Andrea Alvarez    MRN: [id number] DOB: 12/25/1965  10/29/2019  Ms. Smart was observed post Covid-19 immunization for 15 minutes without incident. She was provided with Vaccine Information Sheet and instruction to access the V-Safe system.   [name] was instructed to call 911 with any severe reactions post vaccine: Marland Kitchen Difficulty breathing  . Swelling of face and throat  . A fast heartbeat  . A bad rash all over body  . Dizziness and weakness   Immunizations Administered    Name Date Dose VIS Date Route   Pfizer COVID-19 Vaccine 10/29/2019 12:42 PM 0.3 mL 06/29/2019 Intramuscular   Manufacturer: [employer], [employer]   Lot: [id number]   NDC: [address]-6

## 2019-11-19 ENCOUNTER — Ambulatory Visit: Payer: 59 | Attending: Internal Medicine

## 2019-11-19 DIAGNOSIS — Z23 Encounter for immunization: Secondary | ICD-10-CM

## 2019-11-19 NOTE — Progress Notes (Signed)
   Covid-19 Vaccination Clinic  Name:  Andrea Alvarez    MRN: [id number] DOB: [birth year]  11/19/2019  Andrea Alvarez was observed post Covid-19 immunization for 15 minutes without incident. She was provided with Vaccine Information Sheet and instruction to access the V-Safe system.   Andrea Alvarez was instructed to call 911 with any severe reactions post vaccine: Marland Kitchen Difficulty breathing  . Swelling of face and throat  . A fast heartbeat  . A bad rash all over body  . Dizziness and weakness   Immunizations Administered    Name Date Dose VIS Date Route   Pfizer COVID-19 Vaccine 11/19/2019  3:06 PM 0.3 mL 09/12/2018 Intramuscular   Manufacturer: [employer], [employer]   Lot: [id number]   NDC: [address]-6

## 2020-02-08 ENCOUNTER — Ambulatory Visit (INDEPENDENT_AMBULATORY_CARE_PROVIDER_SITE_OTHER): Payer: No Typology Code available for payment source | Admitting: Physician Assistant

## 2020-02-08 ENCOUNTER — Other Ambulatory Visit: Payer: Self-pay

## 2020-02-08 ENCOUNTER — Encounter: Payer: Self-pay | Admitting: Physician Assistant

## 2020-02-08 VITALS — BP 135/86 | HR 82 | Temp 97.1°F | Resp 16 | Wt 213.0 lb

## 2020-02-08 DIAGNOSIS — I10 Essential (primary) hypertension: Secondary | ICD-10-CM | POA: Diagnosis not present

## 2020-02-08 DIAGNOSIS — Z713 Dietary counseling and surveillance: Secondary | ICD-10-CM | POA: Diagnosis not present

## 2020-02-08 DIAGNOSIS — Z6838 Body mass index (BMI) 38.0-38.9, adult: Secondary | ICD-10-CM

## 2020-02-08 DIAGNOSIS — R079 Chest pain, unspecified: Secondary | ICD-10-CM | POA: Diagnosis not present

## 2020-02-08 MED ORDER — TRIAMTERENE-HCTZ 37.5-25 MG PO TABS: 1.0000 | ORAL_TABLET | Freq: Every day | ORAL | 0 refills | Status: DC

## 2020-02-08 MED ORDER — SAXENDA 18 MG/3ML ~~LOC~~ SOPN: 0.6000 mg | PEN_INJECTOR | Freq: Every day | SUBCUTANEOUS | 3 refills | Status: DC

## 2020-02-08 NOTE — Patient Instructions (Signed)
DASH Eating Plan DASH stands for "Dietary Approaches to Stop Hypertension." The DASH eating plan is a healthy eating plan that has been shown to reduce high blood pressure (hypertension). It may also reduce your risk for type 2 diabetes, heart disease, and stroke. The DASH eating plan may also help with weight loss. What are tips for following this plan?  General guidelines  Avoid eating more than 2,300 mg (milligrams) of salt (sodium) a day. If you have hypertension, you may need to reduce your sodium intake to 1,500 mg a day.  Limit alcohol intake to no more than 1 drink a day for nonpregnant women and 2 drinks a day for men. One drink equals 12 oz of beer, 5 oz of wine, or 1 oz of hard liquor.  Work with your health care provider to maintain a healthy body weight or to lose weight. Ask what an ideal weight is for you.  Get at least 30 minutes of exercise that causes your heart to beat faster (aerobic exercise) most days of the week. Activities may include walking, swimming, or biking.  Work with your health care provider or diet and nutrition specialist (dietitian) to adjust your eating plan to your individual calorie needs. Reading food labels   Check food labels for the amount of sodium per serving. Choose foods with less than 5 percent of the Daily Value of sodium. Generally, foods with less than 300 mg of sodium per serving fit into this eating plan.  To find whole grains, look for the word "whole" as the first word in the ingredient list. Shopping  Buy products labeled as "low-sodium" or "no salt added."  Buy fresh foods. Avoid canned foods and premade or frozen meals. Cooking  Avoid adding salt when cooking. Use salt-free seasonings or herbs instead of table salt or sea salt. Check with your health care provider or pharmacist before using salt substitutes.  Do not fry foods. Cook foods using healthy methods such as baking, boiling, grilling, and broiling instead.  Cook with  heart-healthy oils, such as olive, canola, soybean, or sunflower oil. Meal planning  Eat a balanced diet that includes: ? 5 or more servings of fruits and vegetables each day. At each meal, try to fill half of your plate with fruits and vegetables. ? Up to 6-8 servings of whole grains each day. ? Less than 6 oz of lean meat, poultry, or fish each day. A 3-oz serving of meat is about the same size as a deck of cards. One egg equals 1 oz. ? 2 servings of low-fat dairy each day. ? A serving of nuts, seeds, or beans 5 times each week. ? Heart-healthy fats. Healthy fats called Omega-3 fatty acids are found in foods such as flaxseeds and coldwater fish, like sardines, salmon, and mackerel.  Limit how much you eat of the following: ? Canned or prepackaged foods. ? Food that is high in trans fat, such as fried foods. ? Food that is high in saturated fat, such as fatty meat. ? Sweets, desserts, sugary drinks, and other foods with added sugar. ? Full-fat dairy products.  Do not salt foods before eating.  Try to eat at least 2 vegetarian meals each week.  Eat more home-cooked food and less restaurant, buffet, and fast food.  When eating at a restaurant, ask that your food be prepared with less salt or no salt, if possible. What foods are recommended? The items listed may not be a complete list. Talk with your dietitian about   what dietary choices are best for you. Grains Whole-grain or whole-wheat bread. Whole-grain or whole-wheat pasta. Brown rice. Oatmeal. Quinoa. Bulgur. Whole-grain and low-sodium cereals. Pita bread. Low-fat, low-sodium crackers. Whole-wheat flour tortillas. Vegetables Fresh or frozen vegetables (raw, steamed, roasted, or grilled). Low-sodium or reduced-sodium tomato and vegetable juice. Low-sodium or reduced-sodium tomato sauce and tomato paste. Low-sodium or reduced-sodium canned vegetables. Fruits All fresh, dried, or frozen fruit. Canned fruit in natural juice (without  added sugar). Meat and other protein foods Skinless chicken or turkey. Ground chicken or turkey. Pork with fat trimmed off. Fish and seafood. Egg whites. Dried beans, peas, or lentils. Unsalted nuts, nut butters, and seeds. Unsalted canned beans. Lean cuts of beef with fat trimmed off. Low-sodium, lean deli meat. Dairy Low-fat (1%) or fat-free (skim) milk. Fat-free, low-fat, or reduced-fat cheeses. Nonfat, low-sodium ricotta or cottage cheese. Low-fat or nonfat yogurt. Low-fat, low-sodium cheese. Fats and oils Soft margarine without trans fats. Vegetable oil. Low-fat, reduced-fat, or light mayonnaise and salad dressings (reduced-sodium). Canola, safflower, olive, soybean, and sunflower oils. Avocado. Seasoning and other foods Herbs. Spices. Seasoning mixes without salt. Unsalted popcorn and pretzels. Fat-free sweets. What foods are not recommended? The items listed may not be a complete list. Talk with your dietitian about what dietary choices are best for you. Grains Baked goods made with fat, such as croissants, muffins, or some breads. Dry pasta or rice meal packs. Vegetables Creamed or fried vegetables. Vegetables in a cheese sauce. Regular canned vegetables (not low-sodium or reduced-sodium). Regular canned tomato sauce and paste (not low-sodium or reduced-sodium). Regular tomato and vegetable juice (not low-sodium or reduced-sodium). Pickles. Olives. Fruits Canned fruit in a light or heavy syrup. Fried fruit. Fruit in cream or butter sauce. Meat and other protein foods Fatty cuts of meat. Ribs. Fried meat. Bacon. Sausage. Bologna and other processed lunch meats. Salami. Fatback. Hotdogs. Bratwurst. Salted nuts and seeds. Canned beans with added salt. Canned or smoked fish. Whole eggs or egg yolks. Chicken or turkey with skin. Dairy Whole or 2% milk, cream, and half-and-half. Whole or full-fat cream cheese. Whole-fat or sweetened yogurt. Full-fat cheese. Nondairy creamers. Whipped toppings.  Processed cheese and cheese spreads. Fats and oils Butter. Stick margarine. Lard. Shortening. Ghee. Bacon fat. Tropical oils, such as coconut, palm kernel, or palm oil. Seasoning and other foods Salted popcorn and pretzels. Onion salt, garlic salt, seasoned salt, table salt, and sea salt. Worcestershire sauce. Tartar sauce. Barbecue sauce. Teriyaki sauce. Soy sauce, including reduced-sodium. Steak sauce. Canned and packaged gravies. Fish sauce. Oyster sauce. Cocktail sauce. Horseradish that you find on the shelf. Ketchup. Mustard. Meat flavorings and tenderizers. Bouillon cubes. Hot sauce and Tabasco sauce. Premade or packaged marinades. Premade or packaged taco seasonings. Relishes. Regular salad dressings. Where to find more information:  National Heart, Lung, and Blood Institute: www.nhlbi.nih.gov  American Heart Association: www.heart.org Summary  The DASH eating plan is a healthy eating plan that has been shown to reduce high blood pressure (hypertension). It may also reduce your risk for type 2 diabetes, heart disease, and stroke.  With the DASH eating plan, you should limit salt (sodium) intake to 2,300 mg a day. If you have hypertension, you may need to reduce your sodium intake to 1,500 mg a day.  When on the DASH eating plan, aim to eat more fresh fruits and vegetables, whole grains, lean proteins, low-fat dairy, and heart-healthy fats.  Work with your health care provider or diet and nutrition specialist (dietitian) to adjust your eating plan to your   individual calorie needs. This information is not intended to replace advice given to you by your health care provider. Make sure you discuss any questions you have with your health care provider. Document Revised: 06/17/2017 Document Reviewed: 06/28/2016 Elsevier Patient Education  2020 [employer] Hydrochlorothiazide, HCTZ; Triamterene Oral Tablets or Capsules What is this medicine? HYDROCHLOROTHIAZIDE; TRIAMTERENE (hye droe klor  oh THYE a zide; trye AM ter een) is a combination of 2 diuretics. It helps you make more urine and to lose salt and excess water from your body. It treats swelling from heart, kidney, or liver disease. It also treats high blood pressure. This medicine may be used for other purposes; ask your health care provider or pharmacist if you have questions. COMMON BRAND NAME(S): Dyazide, Maxzide What should I tell my health care provider before I take this medicine? They need to know if you have any of these conditions:  diabetes  immune system problems, like lupus  kidney disease or stones  liver disease  small amount of urine or difficulty passing urine  an unusual or allergic reaction to triamterene, hydrochlorothiazide, sulfa drugs, other medicines, foods, dyes, or preservatives  pregnant or trying to get pregnant  breast-feeding How should I use this medicine? Take this drug by mouth. Take it as directed on the prescription label at the same time every day. You can take it with or without food. If it upsets your stomach, take it with food. Keep taking it unless your health care provider tells you to stop. Talk to your health care provider about the use of this drug in children. Special care may be needed. Overdosage: If you think you have taken too much of this medicine contact a poison control center or emergency room at once. NOTE: This medicine is only for you. Do not share this medicine with others. What if I miss a dose? If you miss a dose, take it as soon as you can. If it is almost time for your next dose, take only that dose. Do not take double or extra doses. What may interact with this medicine? Do not take this medicine with any of the following medications:  cidofovir  dofetilide  eplerenone  potassium supplements  tranylcypromine This medicine may also interact with the following medications:  certain medicines for blood pressure, heart disease like benazepril,  lisinopril, losartan, valsartan  lithium  medicines for diabetes  medicines that relax muscles for surgery  NSAIDs, medicines for pain and inflammation, like ibuprofen or naproxen  other diuretics  penicillin G potassium This list may not describe all possible interactions. Give your health care provider a list of all the medicines, herbs, non-prescription drugs, or dietary supplements you use. Also tell them if you smoke, drink alcohol, or use illegal drugs. Some items may interact with your medicine. What should I watch for while using this medicine? Visit your doctor or health care professional for regular check-ups. You will need lab work done before you start this medicine and regularly while you are taking it. Check your blood pressure regularly. Ask your health care professional what your blood pressure should be, and when you should contact them. This medicine may increase blood sugar. Ask your healthcare provider if changes in diet or medicines are needed if you have diabetes. You may need to be on a special diet while taking this medicine. Ask your doctor. Also, ask how many glasses of fluid you need to drink a day. You must not get dehydrated. You may get drowsy  or dizzy. Do not drive, use machinery, or do anything that needs mental alertness until you know how this medicine affects you. Do not stand or sit up quickly, especially if you are an older patient. This reduces the risk of dizzy or fainting spells. Alcohol may interfere with the effect of this medicine. Avoid or limit alcoholic drinks. Talk to your health care professional about your risk of skin cancer. You may be more at risk for skin cancer if you take this medicine. This medicine can make you more sensitive to the sun. Keep out of the sun. If you cannot avoid being in the sun, wear protective clothing and use sunscreen. Do not use sun lamps or tanning beds/booths. What side effects may I notice from receiving this  medicine? Side effects that you should report to your doctor or health care professional as soon as possible:  allergic reactions such as skin rash or itching, hives, swelling of the lips, mouth, tongue, or throat  changes in vision  eye pain  fast or irregular heartbeat, chest pain  feeling faint or dizzy  gout attack  muscle pain or cramps  numbness or tingling in hands, feet, or lips  pain or difficulty when passing urine  redness, blistering, peeling or loosening of the skin, including inside the mouth   signs and symptoms of high blood sugar such as being more thirsty or hungry or having to urinate more than normal. You may also feel very tired or have blurry vision.  shortness of breath  unusually weak Side effects that usually do not require medical attention (report to your doctor or health care professional if they continue or are bothersome):  change in sex drive or performance  dry mouth  headache  stomach upset This list may not describe all possible side effects. Call your doctor for medical advice about side effects. You may report side effects to FDA at 1-800-FDA-1088. Where should I keep my medicine? Keep out of the reach of children and pets. Store at room temperature between 20 and 25 degrees C (68 and 77 degrees F). Protect from light. Throw away any unused drug after the expiration date. NOTE: This sheet is a summary. It may not cover all possible information. If you have questions about this medicine, talk to your doctor, pharmacist, or health care provider.  2020 Elsevier/Gold Standard (2019-03-12 13:00:43) Liraglutide injection (Weight Management) What is this medicine? LIRAGLUTIDE (LIR a GLOO tide) is used to help people lose weight and maintain weight loss. It is used with a reduced-calorie diet and exercise. This medicine may be used for other purposes; ask your health care provider or pharmacist if you have questions. COMMON BRAND NAME(S):  Saxenda What should I tell my health care provider before I take this medicine? They need to know if you have any of these conditions:  endocrine tumors (MEN 2) or if someone in your family had these tumors  gallbladder disease  high cholesterol  history of alcohol abuse problem  history of pancreatitis  kidney disease or if you are on dialysis  liver disease  previous swelling of the tongue, face, or lips with difficulty breathing, difficulty swallowing, hoarseness, or tightening of the throat  stomach problems  suicidal thoughts, plans, or attempt; a previous suicide attempt by you or a family member  thyroid cancer or if someone in your family had thyroid cancer  an unusual or allergic reaction to liraglutide, other medicines, foods, dyes, or preservatives  pregnant or trying to get  pregnant  breast-feeding How should I use this medicine? This medicine is for injection under the skin of your upper leg, stomach area, or upper arm. You will be taught how to prepare and give this medicine. Use exactly as directed. Take your medicine at regular intervals. Do not take it more often than directed. This drug comes with INSTRUCTIONS FOR USE. Ask your pharmacist for directions on how to use this drug. Read the information carefully. Talk to your pharmacist or health care provider if you have questions. It is important that you put your used needles and syringes in a special sharps container. Do not put them in a trash can. If you do not have a sharps container, call your pharmacist or healthcare provider to get one. A special MedGuide will be given to you by the pharmacist with each prescription and refill. Be sure to read this information carefully each time. Talk to your pediatrician regarding the use of this medicine in children. Special care may be needed. Overdosage: If you think you have taken too much of this medicine contact a poison control center or emergency room at  once. NOTE: This medicine is only for you. Do not share this medicine with others. What if I miss a dose? If you miss a dose, take it as soon as you can. If it is almost time for your next dose, take only that dose. Do not take double or extra doses. If you miss your dose for 3 days or more, call your doctor or health care professional to talk about how to restart this medicine. What may interact with this medicine?  insulin and other medicines for diabetes This list may not describe all possible interactions. Give your health care provider a list of all the medicines, herbs, non-prescription drugs, or dietary supplements you use. Also tell them if you smoke, drink alcohol, or use illegal drugs. Some items may interact with your medicine. What should I watch for while using this medicine? Visit your doctor or health care professional for regular checks on your progress. Drink plenty of fluids while taking this medicine. Check with your doctor or health care professional if you get an attack of severe diarrhea, nausea, and vomiting. The loss of too much body fluid can make it dangerous for you to take this medicine. This medicine may affect blood sugar levels. Ask your healthcare provider if changes in diet or medicines are needed if you have diabetes. Patients and their families should watch out for worsening depression or thoughts of suicide. Also watch out for sudden changes in feelings such as feeling anxious, agitated, panicky, irritable, hostile, aggressive, impulsive, severely restless, overly excited and hyperactive, or not being able to sleep. If this happens, especially at the beginning of treatment or after a change in dose, call your health care professional. Women should inform their health care provider if they wish to become pregnant or think they might be pregnant. Losing weight while pregnant is not advised and may cause harm to the unborn child. Talk to your health care provider for  more information. What side effects may I notice from receiving this medicine? Side effects that you should report to your doctor or health care professional as soon as possible:  allergic reactions like skin rash, itching or hives, swelling of the face, lips, or tongue  breathing problems  diarrhea that continues or is severe  lump or swelling on the neck  severe nausea  signs and symptoms of infection like fever or  chills; cough; sore throat; pain or trouble passing urine  signs and symptoms of low blood sugar such as feeling anxious; confusion; dizziness; increased hunger; unusually weak or tired; increased sweating; shakiness; cold, clammy skin; irritable; headache; blurred vision; fast heartbeat; loss of consciousness  signs and symptoms of kidney injury like trouble passing urine or change in the amount of urine  trouble swallowing  unusual stomach upset or pain  vomiting Side effects that usually do not require medical attention (report to your doctor or health care professional if they continue or are bothersome):  constipation  decreased appetite  diarrhea  fatigue  headache  nausea  pain, redness, or irritation at site where injected  stomach upset  stuffy or runny nose This list may not describe all possible side effects. Call your doctor for medical advice about side effects. You may report side effects to FDA at 1-800-FDA-1088. Where should I keep my medicine? Keep out of the reach of children. Store unopened pen in a refrigerator between 2 and 8 degrees C (36 and 46 degrees F). Do not freeze or use if the medicine has been frozen. Protect from light and excessive heat. After you first use the pen, it can be stored at room temperature between 15 and 30 degrees C (59 and 86 degrees F) or in a refrigerator. Throw away your used pen after 30 days or after the expiration date, whichever comes first. Do not store your pen with the needle attached. If the needle  is left on, medicine may leak from the pen. NOTE: This sheet is a summary. It may not cover all possible information. If you have questions about this medicine, talk to your doctor, pharmacist, or health care provider.  2020 Elsevier/Gold Standard (2019-05-10 21:16:59)

## 2020-02-08 NOTE — Progress Notes (Signed)
I,Roshena L Chambers,acting as a scribe for [employer], PA-C.,have documented all relevant documentation on the behalf of [name], PA-C,as directed by  [name], PA-C while in the presence of [name], [place].   Established patient visit   Patient: Andrea Alvarez   DOB: Dec 11, 1965   54 y.o. Female  MRN: [id number] Visit Date: 02/08/2020  Today's healthcare provider: [name], PA-C   Chief Complaint  Patient presents with  . Blood Pressure Check   Subjective    HPI  Blood pressure check: Patient reports her blood pressure has been elevated. Five months ago she had a health screening done at work and her systolic blood pressure was 140. Since then, she has had some sharp chest pain that occurs intermittently. She sometimes has pressure in her chest. She has some swelling in her feet and ankles.    Patient Active Problem List   Diagnosis Date Noted  . Insulin resistance 08/28/2018  . Other hyperlipidemia 08/14/2018  . Vitamin D deficiency 07/27/2018  . BMI 35.0-35.9,adult 03/25/2016  . Abdominal pain, right upper quadrant 05/13/2015  . 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 Acid reflux 05/13/2015  . LBP (low back pain) 05/13/2015  . Headache, migraine 05/13/2015  . Night sweat 05/13/2015  . Class 2 severe obesity with serious comorbidity and body mass index (BMI) of 36.0 to 36.9 in adult [facility]) 05/13/2015  . Fecal occult blood test positive 05/13/2015   Past Medical History:  Diagnosis Date  . Anemia   . Back pain   . Constipation   . GERD (gastroesophageal reflux disease)   . Joint pain   . Leg edema   . Obesity   . Vitamin D deficiency        Medications: Outpatient Medications Prior to Visit  Medication Sig  . doxepin (SINEQUAN) 10 MG capsule TAKE 1 CAPSULE(10 MG) BY MOUTH FOUR TIMES DAILY AFTER MEALS AND AT BEDTIME  . naproxen sodium (ALEVE) 220 MG tablet Take 440 mg by mouth 2 (two) times daily as needed.    Facility-Administered Medications Prior to Visit  Medication Dose Route Frequency Provider  . triamcinolone acetonide (KENALOG) 10 MG/ML injection 10 mg  10 mg Other Once Asencion Islam, DPM    Review of Systems  Constitutional: Negative.   Eyes: Positive for visual disturbance (some blurred vision occas).  Respiratory: Negative.   Cardiovascular: Positive for chest pain and leg swelling (intermittent). Negative for palpitations.  Neurological: Positive for headaches.    Last CBC Lab Results  Component Value Date   WBC 6.1 07/02/2019   HGB 13.8 07/02/2019   HCT 41.6 07/02/2019   MCV 90 07/02/2019   MCH 29.9 07/02/2019   RDW 12.8 07/02/2019   PLT 346 07/02/2019   Last metabolic panel Lab Results  Component Value Date   GLUCOSE 87 07/02/2019   NA 139 07/02/2019   K 4.2 07/02/2019   CL 100 07/02/2019   CO2 22 07/02/2019   BUN 14 07/02/2019   CREATININE 0.75 07/02/2019   GFRNONAA 91 07/02/2019   GFRAA 105 07/02/2019   CALCIUM 9.9 07/02/2019   PROT 7.5 07/02/2019   ALBUMIN 4.5 07/02/2019   LABGLOB 3.0 07/02/2019   AGRATIO 1.5 07/02/2019   BILITOT 0.4 07/02/2019   ALKPHOS 93 07/02/2019   AST 21 07/02/2019   ALT 27 07/02/2019      Objective    LMP 11/16/2016 (Approximate)  BP Readings from Last 3 Encounters:  02/08/20 (!) 135/86  07/02/19 Marland Kitchen)  150/84  02/02/19 (!) 141/85   Wt Readings from Last 3 Encounters:  02/08/20 (!) 213 lb (96.6 kg)  07/02/19 213 lb 9.6 oz (96.9 kg)  02/02/19 209 lb 12.8 oz (95.2 kg)      Physical Exam Vitals reviewed.  Constitutional:      General: She is not in acute distress.    Appearance: Normal appearance. She is well-developed. She is obese. She is not ill-appearing or diaphoretic.  Neck:     Thyroid: No thyromegaly.     Vascular: No JVD.     Trachea: No tracheal deviation.  Cardiovascular:     Rate and Rhythm: Normal rate and regular rhythm.     Heart sounds: Normal heart sounds. No murmur heard.  No friction rub.  No gallop.   Pulmonary:     Effort: Pulmonary effort is normal. No respiratory distress.     Breath sounds: Normal breath sounds. No wheezing or rales.  Musculoskeletal:     Cervical back: Normal range of motion and neck supple.     Right lower leg: No edema.     Left lower leg: No edema.  Neurological:     Mental Status: She is alert.       No results found for any visits on 02/08/20.  Assessment & Plan     Problem List Items Addressed This Visit      Cardiovascular and Mediastinum   Benign essential HTN    Uncontrolled. Start Maxzide 37.5-25mg  as below F/U in 2-4 weeks      Relevant Medications   triamterene-hydrochlorothiazide (MAXZIDE-25) 37.5-25 MG tablet     Other   Class 2 severe obesity due to excess calories with serious comorbidity and body mass index (BMI) of 38.0 to 38.9 in adult [facility])    Patient has gained some weight since last visit and desires assistance with weight loss  Will start Saxenda as below  Patient is to call for appt for instruction on how to use the pen once received  Will use with healthy dieting habits and exercise       Relevant Medications   Liraglutide -Weight Management (SAXENDA) 18 MG/3ML SOPN   Chest pain - Primary    EKG today shows Sinus rhythm with nonspecific T wave abnormality with rate of 76. Suspect more from uncontrolled HTN and some anxiety component.  F/U in 2-4 weeks for BP recheck.      Relevant Orders   EKG 12-Lead (Completed)   Encounter for weight loss counseling    Starting Saxenda Continue healthy lifestyle changes      Relevant Medications   Liraglutide -Weight Management (SAXENDA) 18 MG/3ML SOPN      No follow-ups on file.      Delmer Islam, PA-C, have reviewed all documentation for this visit. The documentation on 02/12/20 for the exam, diagnosis, procedures, and orders are all accurate and complete.   Reine Just  [facility] [phone]  (phone) [phone] (fax)  [facility]

## 2020-02-12 ENCOUNTER — Encounter: Payer: Self-pay | Admitting: Physician Assistant

## 2020-02-12 DIAGNOSIS — I10 Essential (primary) hypertension: Secondary | ICD-10-CM | POA: Insufficient documentation

## 2020-02-12 DIAGNOSIS — Z713 Dietary counseling and surveillance: Secondary | ICD-10-CM | POA: Insufficient documentation

## 2020-02-12 DIAGNOSIS — R079 Chest pain, unspecified: Secondary | ICD-10-CM | POA: Insufficient documentation

## 2020-02-12 NOTE — Assessment & Plan Note (Signed)
Uncontrolled. Start Maxzide 37.5-25mg  as below F/U in 2-4 weeks

## 2020-02-12 NOTE — Assessment & Plan Note (Signed)
Starting Saxenda Continue healthy lifestyle changes

## 2020-02-12 NOTE — Assessment & Plan Note (Signed)
EKG today shows Sinus rhythm with nonspecific T wave abnormality with rate of 76. Suspect more from uncontrolled HTN and some anxiety component.  F/U in 2-4 weeks for BP recheck.

## 2020-02-12 NOTE — Assessment & Plan Note (Signed)
Patient has gained some weight since last visit and desires assistance with weight loss  Will start Saxenda as below  Patient is to call for appt for instruction on how to use the pen once received  Will use with healthy dieting habits and exercise

## 2020-02-18 ENCOUNTER — Other Ambulatory Visit: Payer: Self-pay | Admitting: Physician Assistant

## 2020-02-18 ENCOUNTER — Ambulatory Visit: Payer: Self-pay

## 2020-02-18 DIAGNOSIS — I10 Essential (primary) hypertension: Secondary | ICD-10-CM

## 2020-02-18 MED ORDER — LOSARTAN POTASSIUM 25 MG PO TABS: 25.0000 mg | ORAL_TABLET | Freq: Every day | ORAL | 0 refills | Status: DC

## 2020-02-18 NOTE — Progress Notes (Signed)
Losartan sent in for HTN

## 2020-02-18 NOTE — Telephone Encounter (Signed)
Patient advised as directed below. 

## 2020-02-18 NOTE — Addendum Note (Signed)
Addended by: [name] on: 02/18/2020 11:30 AM   Modules accepted: Orders

## 2020-02-18 NOTE — Telephone Encounter (Signed)
Pt. Reports she started Triamterene-HCTZ one week ago, and every day she has had nausea and dizziness with this. Over the weekend had headache and nose bleed. Has not checked her BP. Please advise pt.   Answer Assessment - Initial Assessment Questions 1. NAME of MEDICATION: "What medicine are you calling about?"     Fluid pill 2. QUESTION: "What is your question?" (e.g., medication refill, side effect)     Causing nausea and dizziness 3. PRESCRIBING HCP: "Who prescribed it?" Reason: if prescribed by specialist, call should be referred to that group.     [place] 4. SYMPTOMS: "Do you have any symptoms?"     Yes 5. SEVERITY: If symptoms are present, ask "Are they mild, moderate or severe?"     Moderate 6. PREGNANCY:  "Is there any chance that you are pregnant?" "When was your last menstrual period?"     No  Protocols used: MEDICATION QUESTION CALL-A-AH

## 2020-02-18 NOTE — Telephone Encounter (Signed)
Stop Maxzide.  Can change therapy to losartan.

## 2020-02-26 ENCOUNTER — Telehealth: Payer: Self-pay

## 2020-03-03 NOTE — Telephone Encounter (Signed)
Opened in error. KW °

## 2020-03-04 ENCOUNTER — Other Ambulatory Visit: Payer: Self-pay | Admitting: Physician Assistant

## 2020-03-04 DIAGNOSIS — Z713 Dietary counseling and surveillance: Secondary | ICD-10-CM

## 2020-03-04 DIAGNOSIS — Z6838 Body mass index (BMI) 38.0-38.9, adult: Secondary | ICD-10-CM

## 2020-03-04 NOTE — Telephone Encounter (Signed)
Medication Refill - Medication: Liraglutide -Weight Management (SAXENDA) 18 MG/3ML SOPN  Pre auth needed please advise   Has the patient contacted their pharmacy? Yes.   (Agent: If no, request that the patient contact the pharmacy for the refill.) (Agent: If yes, when and what did the pharmacy advise?)  Preferred Pharmacy (with phone number or street name):  [id number] - [place], [place] - [address]  [address] [place] [address]  Phone: [phone] Fax: [phone]     Agent: Please be advised that RX refills may take up to 3 business days. We ask that you follow-up with your pharmacy.

## 2020-03-13 ENCOUNTER — Other Ambulatory Visit: Payer: Self-pay | Admitting: Physician Assistant

## 2020-03-13 DIAGNOSIS — Z713 Dietary counseling and surveillance: Secondary | ICD-10-CM

## 2020-03-14 ENCOUNTER — Other Ambulatory Visit: Payer: Self-pay | Admitting: Physician Assistant

## 2020-03-14 DIAGNOSIS — E66812 Obesity, class 2: Secondary | ICD-10-CM

## 2020-03-14 DIAGNOSIS — Z713 Dietary counseling and surveillance: Secondary | ICD-10-CM

## 2020-03-14 DIAGNOSIS — Z6838 Body mass index (BMI) 38.0-38.9, adult: Secondary | ICD-10-CM

## 2020-03-14 NOTE — Telephone Encounter (Signed)
Called patient that the PA and appeal was denied for the following reason "Weight loss medications are excluded for plan."

## 2020-03-14 NOTE — Telephone Encounter (Signed)
Pt called stating that her insurance company has denied this medication. Pt is requesting to have a PA started. Please advise.

## 2020-05-05 ENCOUNTER — Other Ambulatory Visit: Payer: Self-pay | Admitting: Physician Assistant

## 2020-05-05 DIAGNOSIS — I10 Essential (primary) hypertension: Secondary | ICD-10-CM

## 2020-05-15 ENCOUNTER — Other Ambulatory Visit: Payer: Self-pay | Admitting: Physician Assistant

## 2020-05-15 DIAGNOSIS — I10 Essential (primary) hypertension: Secondary | ICD-10-CM

## 2020-07-02 NOTE — Progress Notes (Signed)
Complete physical exam   Patient: Andrea Alvarez   DOB: [birth year]   54 y.o. Female  MRN: [id number] Visit Date: 07/04/2020  Today's healthcare provider: [name], PA-C   Chief Complaint  Patient presents with  . Annual Exam   Subjective    Andrea Alvarez is a 54 y.o. female who presents today for a complete physical exam.  She reports consuming a thrive diet. Home exercise routine includes walking. She generally feels well. She reports sleeping well. She does not have additional problems to discuss today.  HPI  02/01/18-Pap is normal and HPV negative. Will repeat in 3-5 years 07/15/16-Colonoscopy done. Repeat in 10 years. 03/30/18-Normal mammogram   Past Medical History:  Diagnosis Date  . Anemia   . Back pain   . Constipation   . GERD (gastroesophageal reflux disease)   . Joint pain   . Leg edema   . Obesity   . Vitamin D deficiency    Past Surgical History:  Procedure Laterality Date  . BREAST SURGERY  02/1999   status post bilateral breast reduction  . 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 REDUCTION MAMMAPLASTY Bilateral 1999  . TUBAL LIGATION     Social History   Socioeconomic History  . Marital status: Married    Spouse name: Lennis Rader  . Number of children: Not on file  . Years of education: Not on file  . Highest education level: Not on file  Occupational History  . Occupation: Investment banker, operational  Tobacco Use  . Smoking status: Never Smoker  . Smokeless tobacco: Never Used  Substance and Sexual Activity  . Alcohol use: Yes    Comment: Occasional alcohol use; once or twice a year  . Drug use: No  . Sexual activity: Not on file  Other Topics Concern  . Not on file  Social History Narrative  . Not on file   Social Determinants of Health   Financial Resource Strain: Not on file  Food Insecurity: Not on file  Transportation Needs: Not on file  Physical Activity: Not on file  Stress:  Not on file  Social Connections: Not on file  Intimate Partner Violence: Not on file   Family Status  Relation Name Status  . Mother  Alive       Melanoma  . Father  Deceased  . Sister  Alive  . MGM  Deceased at age 50       died from an MI  . PGM  Deceased  . Neg Hx  (Not Specified)   Family History  Problem Relation Age of Onset  . Hypertension Mother   . Cancer Mother   . Cancer Father        bladder cancer  . Asthma Sister   . Heart disease Sister   . Coronary artery disease Maternal Grandmother   . Coronary artery disease Paternal Grandmother   . Heart attack Paternal Grandmother   . Asthma Paternal Grandmother   . Colon cancer Neg Hx   . Colon polyps Neg Hx   . Esophageal cancer Neg Hx   . Rectal cancer Neg Hx   . Stomach cancer Neg Hx    Allergies  Allergen Reactions  . Maxzide [Hydrochlorothiazide W-Triamterene] Other (See Comments)    dizziness    Patient Care Team: Reine Just as PCP - General (Family Medicine)   Medications: Outpatient Medications Prior to Visit  Medication Sig  .  naproxen sodium (ALEVE) 220 MG tablet Take 440 mg by mouth 2 (two) times daily as needed.  . Liraglutide -Weight Management (SAXENDA) 18 MG/3ML SOPN Inject 0.1 mLs (0.6 mg total) into the skin daily. Increase by 0.6mg  weekly until max dose of 3mg  achieved  . losartan (COZAAR) 25 MG tablet TAKE 1 TABLET(25 MG) BY MOUTH DAILY (Patient not taking: Reported on 07/04/2020)   No facility-administered medications prior to visit.    Review of Systems  Constitutional: Negative.   HENT: Negative.   Eyes: Negative.   Respiratory: Negative.   Cardiovascular: Negative.   Gastrointestinal: Negative.   Endocrine: Negative.   Genitourinary: Negative.   Musculoskeletal: Negative.   Skin: Negative.   Allergic/Immunologic: Negative.   Neurological: Negative.   Hematological: Negative.   Psychiatric/Behavioral: Negative.     Last CBC Lab Results  Component Value  Date   WBC 6.1 07/02/2019   HGB 13.8 07/02/2019   HCT 41.6 07/02/2019   MCV 90 07/02/2019   MCH 29.9 07/02/2019   RDW 12.8 07/02/2019   PLT 346 07/02/2019   Last metabolic panel Lab Results  Component Value Date   GLUCOSE 87 07/02/2019   NA 139 07/02/2019   K 4.2 07/02/2019   CL 100 07/02/2019   CO2 22 07/02/2019   BUN 14 07/02/2019   CREATININE 0.75 07/02/2019   GFRNONAA 91 07/02/2019   GFRAA 105 07/02/2019   CALCIUM 9.9 07/02/2019   PROT 7.5 07/02/2019   ALBUMIN 4.5 07/02/2019   LABGLOB 3.0 07/02/2019   AGRATIO 1.5 07/02/2019   BILITOT 0.4 07/02/2019   ALKPHOS 93 07/02/2019   AST 21 07/02/2019   ALT 27 07/02/2019      Objective    BP 134/86 (BP Location: Left Arm, Patient Position: Sitting, Cuff Size: Large)   Pulse 89   Temp 98.3 F (36.8 C) (Oral)   Resp 16   Ht 5\' 2"  (1.575 m)   Wt 209 lb 11.2 oz (95.1 kg)   LMP 11/16/2016 (Approximate)   BMI 38.35 kg/m  BP Readings from Last 3 Encounters:  07/04/20 134/86  02/08/20 (!) 135/86  07/02/19 (!) 150/84   Wt Readings from Last 3 Encounters:  07/04/20 209 lb 11.2 oz (95.1 kg)  02/08/20 (!) 213 lb (96.6 kg)  07/02/19 213 lb 9.6 oz (96.9 kg)      Physical Exam Vitals reviewed.  Constitutional:      General: She is not in acute distress.    Appearance: Normal appearance. She is well-developed and well-nourished. She is obese. She is not ill-appearing or diaphoretic.  HENT:     Head: Normocephalic and atraumatic.     Right Ear: Tympanic membrane, ear canal and external ear normal.     Left Ear: Tympanic membrane, ear canal and external ear normal.     Mouth/Throat:     Mouth: Oropharynx is clear and moist.  Eyes:     General: No scleral icterus.       Right eye: No discharge.        Left eye: No discharge.     Extraocular Movements: Extraocular movements intact and EOM normal.     Conjunctiva/sclera: Conjunctivae normal.     Pupils: Pupils are equal, round, and reactive to light.  Neck:      Thyroid: No thyromegaly.     Vascular: No carotid bruit or JVD.     Trachea: No tracheal deviation.  Cardiovascular:     Rate and Rhythm: Normal rate and regular rhythm.  Pulses: Normal pulses and intact distal pulses.     Heart sounds: Normal heart sounds. No murmur heard. No friction rub. No gallop.   Pulmonary:     Effort: Pulmonary effort is normal. No respiratory distress.     Breath sounds: Normal breath sounds. No wheezing or rales.  Chest:     Chest wall: No tenderness.  Abdominal:     General: Abdomen is flat. Bowel sounds are normal. There is no distension.     Palpations: Abdomen is soft. There is no mass.     Tenderness: There is no abdominal tenderness. There is no guarding or rebound.  Musculoskeletal:        General: No tenderness or edema. Normal range of motion.     Cervical back: Normal range of motion and neck supple. No tenderness.     Right lower leg: No edema.     Left lower leg: No edema.  Lymphadenopathy:     Cervical: No cervical adenopathy.  Skin:    General: Skin is warm and dry.     Capillary Refill: Capillary refill takes less than 2 seconds.     Findings: No rash.  Neurological:     General: No focal deficit present.     Mental Status: She is alert and oriented to person, place, and time. Mental status is at baseline.  Psychiatric:        Mood and Affect: Mood and affect and mood normal.        Behavior: Behavior normal.        Thought Content: Thought content normal.        Judgment: Judgment normal.       Last depression screening scores PHQ 2/9 Scores 07/04/2020 07/02/2019 05/16/2018  PHQ - 2 Score 0 0 1  PHQ- 9 Score - - 8   Last fall risk screening Fall Risk  07/02/2019  Falls in the past year? 0  Number falls in past yr: 0  Injury with Fall? 0  Follow up Falls evaluation completed   Last Audit-C alcohol use screening Alcohol Use Disorder Test (AUDIT) 07/02/2019  1. How often do you have a drink containing alcohol? 2  2. How  many drinks containing alcohol do you have on a typical day when you are drinking? 0  3. How often do you have six or more drinks on one occasion? 0  AUDIT-C Score 2  Alcohol Brief Interventions/Follow-up -   A score of 3 or more in women, and 4 or more in men indicates increased risk for alcohol abuse, EXCEPT if all of the points are from question 1   No results found for any visits on 07/04/20.  Assessment & Plan    Routine Health Maintenance and Physical Exam  Exercise Activities and Dietary recommendations Goals   None     Immunization History  Administered Date(s) Administered  . Influenza Split 05/13/2010  . Influenza,inj,Quad PF,6+ Mos 05/13/2014, 05/16/2015, 04/23/2016, 04/28/2018, 07/02/2019  . PFIZER SARS-COV-2 Vaccination 10/29/2019, 11/19/2019  . Tdap 05/13/2014    Health Maintenance  Topic Date Due  . Hepatitis C Screening  Never done  . COVID-19 Vaccine (3 - Pfizer risk 4-dose series) 12/17/2019  . INFLUENZA VACCINE  02/17/2020  . MAMMOGRAM  03/29/2020  . PAP SMEAR-Modifier  02/02/2023  . TETANUS/TDAP  05/13/2024  . COLONOSCOPY  07/15/2026  . HIV Screening  Completed    Discussed health benefits of physical activity, and encouraged her to engage in regular exercise appropriate for her age  and condition.  1. Annual physical exam Normal physical exam today. Will check labs as below and f/u pending lab results. If labs are stable and WNL she will not need to have these rechecked for one year at her next annual physical exam. She is to call the office in the meantime if she has any acute issue, questions or concerns. - CBC with Differential/Platelet - Comprehensive metabolic panel - Hemoglobin A1c - Lipid panel - TSH  2. Encounter for breast cancer screening using non-mammogram modality Breast exam today was normal. There is no family history of breast cancer. She does perform regular self breast exams. Mammogram was ordered as below. Information for  [facility] was given to patient so she may schedule her mammogram at her convenience. - MM 3D SCREEN BREAST BILATERAL; Future  3. Class 2 severe obesity due to excess calories with serious comorbidity and body mass index (BMI) of 38.0 to 38.9 in adult [facility](HCC) Using Thrive for weight loss. Will check labs as below and f/u pending results. - CBC with Differential/Platelet - Comprehensive metabolic panel - Hemoglobin A1c - Lipid panel - TSH  4. Benign essential HTN Stable. Will check labs as below and f/u pending results. - CBC with Differential/Platelet - Comprehensive metabolic panel - Hemoglobin A1c - Lipid panel - TSH  5. Other hyperlipidemia Diet controlled. Will check labs as below and f/u pending results. - CBC with Differential/Platelet - Comprehensive metabolic panel - Hemoglobin A1c - Lipid panel - TSH  6. Need for influenza vaccination Flu vaccine given today without complication. Patient sat upright for 15 minutes to check for adverse reaction before being released. - Flu Vaccine QUAD 36+ mos IM  7. Need for COVID-19 vaccine Covid 19 booster Vaccine given to patient without complications. Patient sat for 15 minutes after administration and was tolerated well without adverse effects. - Pfizer SARS-COV-2 Vaccine   No follow-ups on file.     Delmer IslamI, Vira Chaplin M Alec Jaros, PA-C, have reviewed all documentation for this visit. The documentation on 07/04/20 for the exam, diagnosis, procedures, and orders are all accurate and complete.   Reine JustJennifer M Afrika Brick, PA-C  [facility] [phone] (phone) [phone] (fax)  [facility]

## 2020-07-03 ENCOUNTER — Encounter: Payer: Self-pay | Admitting: Physician Assistant

## 2020-07-04 ENCOUNTER — Other Ambulatory Visit: Payer: Self-pay

## 2020-07-04 ENCOUNTER — Ambulatory Visit (INDEPENDENT_AMBULATORY_CARE_PROVIDER_SITE_OTHER): Payer: No Typology Code available for payment source | Admitting: Physician Assistant

## 2020-07-04 ENCOUNTER — Encounter: Payer: Self-pay | Admitting: Physician Assistant

## 2020-07-04 VITALS — BP 134/86 | HR 89 | Temp 98.3°F | Resp 16 | Ht 62.0 in | Wt 209.7 lb

## 2020-07-04 DIAGNOSIS — E7849 Other hyperlipidemia: Secondary | ICD-10-CM

## 2020-07-04 DIAGNOSIS — Z1239 Encounter for other screening for malignant neoplasm of breast: Secondary | ICD-10-CM

## 2020-07-04 DIAGNOSIS — I10 Essential (primary) hypertension: Secondary | ICD-10-CM

## 2020-07-04 DIAGNOSIS — Z23 Encounter for immunization: Secondary | ICD-10-CM

## 2020-07-04 DIAGNOSIS — Z0000 Encounter for general adult medical examination without abnormal findings: Secondary | ICD-10-CM

## 2020-07-04 DIAGNOSIS — Z6838 Body mass index (BMI) 38.0-38.9, adult: Secondary | ICD-10-CM

## 2020-07-04 NOTE — Patient Instructions (Signed)

## 2020-07-05 LAB — CBC WITH DIFFERENTIAL/PLATELET
Basophils Absolute: 0.1 10*3/uL (ref 0.0–0.2)
Basos: 1 %
EOS (ABSOLUTE): 0.1 10*3/uL (ref 0.0–0.4)
Eos: 1 %
Hematocrit: 42.6 % (ref 34.0–46.6)
Hemoglobin: 14.1 g/dL (ref 11.1–15.9)
Immature Grans (Abs): 0 10*3/uL (ref 0.0–0.1)
Immature Granulocytes: 0 %
Lymphocytes Absolute: 1.8 10*3/uL (ref 0.7–3.1)
Lymphs: 31 %
MCH: 29.9 pg (ref 26.6–33.0)
MCHC: 33.1 g/dL (ref 31.5–35.7)
MCV: 90 fL (ref 79–97)
Monocytes Absolute: 0.7 10*3/uL (ref 0.1–0.9)
Monocytes: 12 %
Neutrophils Absolute: 3.2 10*3/uL (ref 1.4–7.0)
Neutrophils: 55 %
Platelets: 329 10*3/uL (ref 150–450)
RBC: 4.72 x10E6/uL (ref 3.77–5.28)
RDW: 12.6 % (ref 11.7–15.4)
WBC: 5.7 10*3/uL (ref 3.4–10.8)

## 2020-07-05 LAB — TSH: TSH: 2.08 u[IU]/mL (ref 0.450–4.500)

## 2020-07-05 LAB — COMPREHENSIVE METABOLIC PANEL
ALT: 18 IU/L (ref 0–32)
AST: 16 IU/L (ref 0–40)
Albumin/Globulin Ratio: 1.6 (ref 1.2–2.2)
Albumin: 4.5 g/dL (ref 3.8–4.9)
Alkaline Phosphatase: 71 IU/L (ref 44–121)
BUN/Creatinine Ratio: 20 (ref 9–23)
BUN: 15 mg/dL (ref 6–24)
Bilirubin Total: 0.4 mg/dL (ref 0.0–1.2)
CO2: 24 mmol/L (ref 20–29)
Calcium: 9.9 mg/dL (ref 8.7–10.2)
Chloride: 101 mmol/L (ref 96–106)
Creatinine, Ser: 0.75 mg/dL (ref 0.57–1.00)
GFR calc Af Amer: 105 mL/min/{1.73_m2} (ref >59)
GFR calc non Af Amer: 91 mL/min/{1.73_m2} (ref >59)
Globulin, Total: 2.9 g/dL (ref 1.5–4.5)
Glucose: 83 mg/dL (ref 65–99)
Potassium: 4.4 mmol/L (ref 3.5–5.2)
Sodium: 140 mmol/L (ref 134–144)
Total Protein: 7.4 g/dL (ref 6.0–8.5)

## 2020-07-05 LAB — LIPID PANEL
Chol/HDL Ratio: 2.9 ratio (ref 0.0–4.4)
Cholesterol, Total: 206 mg/dL — ABNORMAL HIGH (ref 100–199)
HDL: 72 mg/dL (ref >39)
LDL Chol Calc (NIH): 124 mg/dL — ABNORMAL HIGH (ref 0–99)
Triglycerides: 55 mg/dL (ref 0–149)
VLDL Cholesterol Cal: 10 mg/dL (ref 5–40)

## 2020-07-05 LAB — HEMOGLOBIN A1C
Est. average glucose Bld gHb Est-mCnc: 103 mg/dL
Hgb A1c MFr Bld: 5.2 % (ref 4.8–5.6)

## 2020-08-06 ENCOUNTER — Emergency Department (HOSPITAL_COMMUNITY)
Admission: EM | Admit: 2020-08-06 | Discharge: 2020-08-06 | Disposition: A | Discharge status: Home / Self Care | Payer: No Typology Code available for payment source | Attending: Emergency Medicine | Admitting: Emergency Medicine

## 2020-08-06 ENCOUNTER — Emergency Department (HOSPITAL_COMMUNITY): Payer: No Typology Code available for payment source

## 2020-08-06 ENCOUNTER — Encounter (HOSPITAL_COMMUNITY): Payer: Self-pay | Admitting: Emergency Medicine

## 2020-08-06 ENCOUNTER — Other Ambulatory Visit: Payer: Self-pay

## 2020-08-06 DIAGNOSIS — Z23 Encounter for immunization: Secondary | ICD-10-CM | POA: Diagnosis not present

## 2020-08-06 DIAGNOSIS — S6991XA Unspecified injury of right wrist, hand and finger(s), initial encounter: Secondary | ICD-10-CM | POA: Diagnosis present

## 2020-08-06 DIAGNOSIS — Z79899 Other long term (current) drug therapy: Secondary | ICD-10-CM | POA: Diagnosis not present

## 2020-08-06 DIAGNOSIS — I10 Essential (primary) hypertension: Secondary | ICD-10-CM | POA: Insufficient documentation

## 2020-08-06 DIAGNOSIS — W230XXA Caught, crushed, jammed, or pinched between moving objects, initial encounter: Secondary | ICD-10-CM | POA: Insufficient documentation

## 2020-08-06 DIAGNOSIS — S62524B Nondisplaced fracture of distal phalanx of right thumb, initial encounter for open fracture: Secondary | ICD-10-CM | POA: Diagnosis not present

## 2020-08-06 MED ORDER — LIDOCAINE HCL 2 % IJ SOLN
10.0000 mL | Freq: Once | INTRAMUSCULAR | Status: DC
  Filled 2020-08-06: qty 20

## 2020-08-06 MED ORDER — TETANUS-DIPHTH-ACELL PERTUSSIS 5-2.5-18.5 LF-MCG/0.5 IM SUSY
0.5000 mL | PREFILLED_SYRINGE | Freq: Once | INTRAMUSCULAR | Status: AC
  Administered 2020-08-06: 0.5 mL via INTRAMUSCULAR
  Filled 2020-08-06: qty 0.5

## 2020-08-06 MED ORDER — CEPHALEXIN 500 MG PO CAPS: 500.0000 mg | ORAL_CAPSULE | Freq: Four times a day (QID) | ORAL | 0 refills | Status: AC

## 2020-08-06 NOTE — ED Triage Notes (Signed)
Patient shut her R thumb in the car door this morning around 0900, laceration below the nail bed, bruising and swelling noted.

## 2020-08-06 NOTE — ED Provider Notes (Signed)
[facility]-EMERGENCY DEPT Provider Note   CSN: [id number] Arrival date & time: 08/06/20  0945     History Chief Complaint  Patient presents with  . thumb injury    Andrea Alvarez is a 55 y.o. female.  HPI   55 year old female with a history of anemia, back pain, constipation, GERD, joint pain, leg edema, obesity, who presents emergency department today for evaluation of right thumb pain.  States she smashed her finger in a car door this morning around 9 AM.  She has a laceration to the finger and some ecchymosis to the nailbed.  Denies any changes with range of motion or sensation.  Denies other injuries.  Past Medical History:  Diagnosis Date  . Anemia   . Back pain   . Constipation   . GERD (gastroesophageal reflux disease)   . Joint pain   . Leg edema   . Obesity   . Vitamin D deficiency     Patient Active Problem List   Diagnosis Date Noted  . Chest pain 02/12/2020  . Benign essential HTN 02/12/2020  . Encounter for weight loss counseling 02/12/2020  . Insulin resistance 08/28/2018  . Other hyperlipidemia 08/14/2018  . Vitamin D deficiency 07/27/2018  . Abdominal pain, right upper quadrant 05/13/2015  . 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 Acid reflux 05/13/2015  . LBP (low back pain) 05/13/2015  . Headache, migraine 05/13/2015  . Night sweat 05/13/2015  . Class 2 severe obesity due to excess calories with serious comorbidity and body mass index (BMI) of 38.0 to 38.9 in adult (HCC) 05/13/2015  . Fecal occult blood test positive 05/13/2015    Past Surgical History:  Procedure Laterality Date  . BREAST SURGERY  02/1999   status post bilateral breast reduction  . 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 REDUCTION MAMMAPLASTY Bilateral 1999  . TUBAL LIGATION       OB History    Gravida  2   Para  2   Term      Preterm      AB      Living        SAB      IAB      Ectopic      Multiple      Live  Births              Family History  Problem Relation Age of Onset  . Hypertension Mother   . Cancer Mother   . Cancer Father        bladder cancer  . Asthma Sister   . Heart disease Sister   . Coronary artery disease Maternal Grandmother   . Coronary artery disease Paternal Grandmother   . Heart attack Paternal Grandmother   . Asthma Paternal Grandmother   . Colon cancer Neg Hx   . Colon polyps Neg Hx   . Esophageal cancer Neg Hx   . Rectal cancer Neg Hx   . Stomach cancer Neg Hx     Social History   Tobacco Use  . Smoking status: Never Smoker  . Smokeless tobacco: Never Used  Substance Use Topics  . Alcohol use: Yes    Comment: Occasional alcohol use; once or twice a year  . Drug use: No    Home Medications Prior to Admission medications   Medication Sig Start Date End Date Taking? Authorizing Provider  cephALEXin (KEFLEX) 500 MG capsule Take 1 capsule (500 mg total) by mouth 4 (  four) times daily for 7 days. 08/06/20 08/13/20 Yes Rosealie Reach S, PA-C  losartan (COZAAR) 25 MG tablet TAKE 1 TABLET(25 MG) BY MOUTH DAILY Patient not taking: Reported on 07/04/2020 05/15/20   [name], PA-C  naproxen sodium (ALEVE) 220 MG tablet Take 440 mg by mouth 2 (two) times daily as needed.    [provider]    Allergies    Maxzide [hydrochlorothiazide w-triamterene]  Review of Systems   Review of Systems  Musculoskeletal:       Right thumb pain  Skin: Positive for wound.  Neurological: Negative for weakness and numbness.    Physical Exam Updated Vital Signs BP (!) 144/96 (BP Location: Left Arm)   Pulse 79   Temp 98 F (36.7 C) (Oral)   Resp 18   Ht 5\' 2"  (1.575 m)   Wt 91.2 kg   LMP 11/16/2016 (Approximate)   SpO2 100%   BMI 36.76 kg/m   Physical Exam Vitals and nursing note reviewed.  Constitutional:      General: She is not in acute distress.    Appearance: She is well-developed and well-nourished.  HENT:     Head: Normocephalic  and atraumatic.  Eyes:     Conjunctiva/sclera: Conjunctivae normal.  Cardiovascular:     Rate and Rhythm: Normal rate.  Pulmonary:     Effort: Pulmonary effort is normal.  Musculoskeletal:        General: Normal range of motion.     Cervical back: Neck supple.     Comments: 1cm laceration near the base of the nail on the right thumb. Ecchymosis noted to the tip of the thumb and there is also a subungual hematoma. ROM, strength are intact. Brisk cap refill distally.   Skin:    General: Skin is warm and dry.  Neurological:     Mental Status: She is alert.  Psychiatric:        Mood and Affect: Mood and affect normal.     ED Results / Procedures / Treatments   Labs (all labs ordered are listed, but only abnormal results are displayed) Labs Reviewed - No data to display  EKG None  Radiology DG Finger Thumb Right  Result Date: 08/06/2020 CLINICAL DATA:  Right thumb injury. EXAM: RIGHT THUMB 2+V COMPARISON:  No recent. FINDINGS: Comminuted fracture of the distal phalanx of the right thumb noted. No evidence of dislocation. No radiopaque foreign bodies. IMPRESSION: Comminuted fracture of the distal phalanx of the right thumb. Electronically Signed   By: 08/08/2020  Register   On: 08/06/2020 10:54    Procedures Procedures (including critical care time)  Medications Ordered in ED Medications  lidocaine (XYLOCAINE) 2 % (with pres) injection 200 mg (has no administration in time range)  Tdap (BOOSTRIX) injection 0.5 mL (0.5 mLs Intramuscular Given 08/06/20 1900)    ED Course  I have reviewed the triage vital signs and the nursing notes.  Pertinent labs & imaging results that were available during my care of the patient were reviewed by me and considered in my medical decision making (see chart for details).    MDM Rules/Calculators/A&P                         55 year old female with a history of anemia, back pain, constipation, GERD, joint pain, leg edema, obesity, who presents  emergency department today for evaluation of right thumb pain.  States she smashed her finger in a car door this morning around 9  AM.  She has a laceration to the finger and some ecchymosis to the nailbed.  Denies any changes with range of motion or sensation.  Denies other injuries.  I irrigated the patient's wound with a liter of normal saline.  tdap was updated. The laceration is very small and it looks like there is an area of skin that is actually missing.  Had a shared decision-making conversation with the patient regarding suture repair and trephination versus placing a dressing on the wound and splinting the wound and having her follow-up with hand surgery.  She would like to hold off on sutures and trephination at this time given that the subungual hematoma and laceration are very small.  I feel that this is appropriate.  We will give her prescription for antibiotics given that she has laceration and a fracture in that area.  She is also placed in a splint.  Have advised on specific return precautions.  She voiced understanding of the plan and reasons to return.  All questions answered.  Patient stable for discharge.  Final Clinical Impression(s) / ED Diagnoses Final diagnoses:  Open nondisplaced fracture of distal phalanx of right thumb, initial encounter    Rx / DC Orders ED Discharge Orders         Ordered    cephALEXin (KEFLEX) 500 MG capsule  4 times daily        08/06/20 1903           Karrie Meres, PA-C 08/06/20 2109    Derwood Kaplan, MD 08/16/20 0800

## 2020-08-06 NOTE — Discharge Instructions (Addendum)
You were given a prescription for antibiotics. Please take the antibiotic prescription fully.   You may alternate taking Tylenol and Ibuprofen as needed for pain control. You may take 400-600 mg of ibuprofen every 6 hours and [id number] mg of Tylenol every 6 hours. Do not exceed 4000 mg of Tylenol daily as this can lead to liver damage. Also, make sure to take Ibuprofen with meals as it can cause an upset stomach. Do not take other NSAIDs while taking Ibuprofen such as (Aleve, Naprosyn, Aspirin, Celebrex, etc) and do not take more than the prescribed dose as this can lead to ulcers and bleeding in your GI tract. You may use warm and cold compresses to help with your symptoms.   Please follow up with hand doctor, Dr. Roney Mans, within the next 7-10 days for re-evaluation and further treatment of your symptoms.   Please return to the ER sooner if you have any new or worsening symptoms.

## 2020-10-15 ENCOUNTER — Other Ambulatory Visit: Payer: Self-pay

## 2020-10-15 ENCOUNTER — Encounter (INDEPENDENT_AMBULATORY_CARE_PROVIDER_SITE_OTHER): Payer: Self-pay | Admitting: Family Medicine

## 2020-10-15 ENCOUNTER — Ambulatory Visit (INDEPENDENT_AMBULATORY_CARE_PROVIDER_SITE_OTHER): Payer: No Typology Code available for payment source | Admitting: Family Medicine

## 2020-10-15 VITALS — BP 134/85 | HR 74 | Temp 98.0°F | Ht 62.0 in | Wt 197.0 lb

## 2020-10-15 DIAGNOSIS — G8929 Other chronic pain: Secondary | ICD-10-CM

## 2020-10-15 DIAGNOSIS — R0602 Shortness of breath: Secondary | ICD-10-CM | POA: Diagnosis not present

## 2020-10-15 DIAGNOSIS — E8881 Metabolic syndrome: Secondary | ICD-10-CM | POA: Diagnosis not present

## 2020-10-15 DIAGNOSIS — Z6836 Body mass index (BMI) 36.0-36.9, adult: Secondary | ICD-10-CM

## 2020-10-15 DIAGNOSIS — F3289 Other specified depressive episodes: Secondary | ICD-10-CM | POA: Diagnosis not present

## 2020-10-15 DIAGNOSIS — F32A Depression, unspecified: Secondary | ICD-10-CM | POA: Insufficient documentation

## 2020-10-15 DIAGNOSIS — E559 Vitamin D deficiency, unspecified: Secondary | ICD-10-CM

## 2020-10-15 DIAGNOSIS — Z9189 Other specified personal risk factors, not elsewhere classified: Secondary | ICD-10-CM

## 2020-10-15 DIAGNOSIS — R5383 Other fatigue: Secondary | ICD-10-CM | POA: Insufficient documentation

## 2020-10-15 DIAGNOSIS — M549 Dorsalgia, unspecified: Secondary | ICD-10-CM

## 2020-10-15 DIAGNOSIS — Z0289 Encounter for other administrative examinations: Secondary | ICD-10-CM

## 2020-10-15 DIAGNOSIS — E7849 Other hyperlipidemia: Secondary | ICD-10-CM

## 2020-10-15 DIAGNOSIS — K5909 Other constipation: Secondary | ICD-10-CM

## 2020-10-15 NOTE — Patient Instructions (Signed)
The 10-year ASCVD risk score Denman George DC Montez Hageman., et al., 2013) is: 1.7%   Values used to calculate the score:     Age: 55 years     Sex: Female     Is Non-Hispanic African American: No     Diabetic: No     Tobacco smoker: No     Systolic Blood Pressure: 134 mmHg     Is BP treated: No     HDL Cholesterol: 72 mg/dL     Total Cholesterol: 206 mg/dL

## 2020-10-15 NOTE — Progress Notes (Signed)
Office: [phone]  /  Fax: [phone]    Date: October 21, 2020   Appointment Start Time: 2:58pm Duration: 31 minutes Provider: Lawerance Cruel, Psy.D. Type of Session: Intake for Individual Therapy  Location of Patient: Work Location of Provider: Provider's Home (private office) Type of Contact: Telepsychological Visit via MyChart Video Visit  Informed Consent: Prior to proceeding with today's appointment, two pieces of identifying information were obtained. In addition, Andrea Alvarez's physical location at the time of this appointment was obtained as well a phone number she could be reached at in the event of technical difficulties. Andrea Alvarez and this provider participated in today's telepsychological service.   The provider's role was explained to Andrea Alvarez. The provider reviewed and discussed issues of confidentiality, privacy, and limits therein (e.g., reporting obligations). In addition to verbal informed consent, written informed consent for psychological services was obtained prior to the initial appointment. Since the clinic is not a 24/7 crisis center, mental health emergency resources were shared and this  provider explained MyChart, e-mail, voicemail, and/or other messaging systems should be utilized only for non-emergency reasons. This provider also explained that information obtained during appointments will be placed in Andrea Alvarez's medical record and relevant information will be shared with other providers at [facility] for coordination of care. Andrea Alvarez agreed information may be shared with other [facility] providers as needed for coordination of care and by signing the service agreement document, she provided written consent for coordination of care. Prior to initiating telepsychological services, Andrea Alvarez completed an informed consent document, which included the development of a safety plan (i.e., an emergency contact and emergency resources) in the event of an  emergency/crisis. Andrea Alvarez expressed understanding of the rationale of the safety plan. Andrea Alvarez verbally acknowledged understanding she is ultimately responsible for understanding her insurance benefits for telepsychological and in-person services. This provider also reviewed confidentiality, as it relates to telepsychological services, as well as the rationale for telepsychological services (i.e., to reduce exposure risk to COVID-19). Andrea Alvarez  acknowledged understanding that appointments cannot be recorded without both party consent and she is aware she is responsible for securing confidentiality on her end of the session. Andrea Alvarez verbally consented to proceed.  Chief Complaint/HPI: Andrea Alvarez was referred by Dr. Thomasene Lot due to other depression, with emotional eating on October 15, 2020. Andrea Alvarez's Food and Mood (modified PHQ-9) score on October 15, 2020 was 6.  During today's appointment, Andrea Alvarez was verbally administered a questionnaire assessing various behaviors related to emotional eating. Andrea Alvarez endorsed the following: overeat when you are celebrating, find food is comforting to you, overeat frequently when you are bored or lonely, not worry about what you eat when you are in a good mood, overeat when you are alone, but eat much less when you are with other people and eat as a reward. Andrea Alvarez believes the onset of emotional eating was likely in childhood, and described the current frequency of emotional eating as "every weekend." She further shared she often goes out to eat and associates food with socializing. In addition, Andrea Alvarez denied a history of binge eating. Andrea Alvarez denied a history of restricting food intake, purging and engagement in other compensatory strategies for weight loss, and has never been diagnosed with an eating disorder. She also denied a history of treatment for emotional eating. Furthermore, Andrea Alvarez reported things are going well with the structured meal plan, noting, "I'm a returning patient."     Mental Status Examination:  Appearance: well groomed and appropriate hygiene  Behavior: appropriate  to circumstances Mood: euthymic Affect: mood congruent Speech: normal in rate, volume, and tone Eye Contact: appropriate Psychomotor Activity: unable to assess  Gait: unable to assess Thought Process: linear, logical, and goal directed  Thought Content/Perception: denies suicidal and homicidal ideation, plan, and intent and no hallucinations, delusions, bizarre thinking or behavior reported or observed Orientation: time, person, place, and purpose of appointment Memory/Concentration: memory, attention, language, and fund of knowledge intact  Insight/Judgment: good  Family & Psychosocial History: Andrea Alvarez reported she is separated and she has two children (ages 77 and 64). 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 Additionally, Andrea Alvarez shared her highest level of education obtained is a high school diploma. Currently, Andrea Alvarez's social support system consists of her friends and some family. Moreover, Andrea Alvarez stated she resides with her cat.   Medical History:  Past Medical History:  Diagnosis Date  . Anemia   . Back pain   . Constipation   . GERD (gastroesophageal reflux disease)   . Joint pain   . Leg edema   . Obesity   . SOBOE (shortness of breath on exertion)   . Swelling of both lower extremities   . Vitamin D deficiency    Past Surgical History:  Procedure Laterality Date  . BREAST SURGERY  02/1999   status post bilateral breast reduction  . 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 REDUCTION MAMMAPLASTY Bilateral 1999  . TUBAL LIGATION  1989   No current outpatient medications on file prior to visit.   No current facility-administered medications on file prior to visit.   Mental Health History: Andrea Alvarez denied a history of therapeutic  services. She recalled she was prescribed Wellbutrin approximately 20 years ago when she was "an empty nester." Nila reported there is no history of hospitalizations for psychiatric concerns. Andrea Alvarez reported her biological father was an alcoholic, adding she did not grow up with her biological father or her siblings on that side. Andrea Alvarez reported there is no history of trauma including psychological, physical  and sexual abuse, as well as neglect.   Andrea Alvarez described her typical mood lately as "fine." Aside from concerns noted above, Andrea Alvarez reported experiencing some fatigue secondary to having COVID-19. Andrea Alvarez endorsed infrequent alcohol use, noting she consumes approximately three standard drinks every six months. She denied tobacco use. She denied illicit/recreational substance use. Regarding caffeine intake, Resha reported consuming tea (1/2 gallon) daily. Furthermore, Corean indicated she is not experiencing the following: hallucinations and delusions, paranoia, symptoms of mania , social withdrawal, crying spells, panic attacks and decreased motivation. She also denied history of and current suicidal ideation, plan, and intent; history of and current homicidal ideation, plan, and intent; and history of and current engagement in self-harm.  The following strengths were reported by Andrea Alvarez: people person, enjoy taking care of people, and very adaptable. The following strengths were observed by this provider: ability to express thoughts and feelings during the therapeutic session, ability to establish and benefit from a therapeutic relationship, willingness to work toward established goal(s) with the clinic and ability to engage in reciprocal conversation.   Legal History: Gyselle reported there is no history of legal involvement.   Structured Assessments Results: The Patient Health Questionnaire-9 (PHQ-9) is a self-report measure that assesses symptoms and severity of depression over the course of the  last two weeks. Jazlene obtained a score of 0. [0= Not at all; 1= Several days;  2= More than half the days; 3= Nearly every day] Little interest or pleasure in doing things 0  Feeling down, depressed, or hopeless 0  Trouble falling or staying asleep, or sleeping too much 0  Feeling tired or having little energy 0  Poor appetite or overeating 0  Feeling bad about yourself --- or that you are a failure or have let yourself or your family down 0  Trouble concentrating on things, such as reading the newspaper or watching television 0  Moving or speaking so slowly that other people could have noticed? Or the opposite --- being so fidgety or restless that you have been moving around a lot more than usual 0  Thoughts that you would be better off dead or hurting yourself in some way 0  PHQ-9 Score 0    The Generalized Anxiety Disorder-7 (GAD-7) is a brief self-report measure that assesses symptoms of anxiety over the course of the last two weeks. Erielle obtained a score of 0. [0= Not at all; 1= Several days; 2= Over half the days; 3= Nearly every day] Feeling nervous, anxious, on edge 0  Not being able to stop or control worrying 0  Worrying too much about different things 0  Trouble relaxing 0  Being so restless that it's hard to sit still 0  Becoming easily annoyed or irritable 0  Feeling afraid as if something awful might happen 0  GAD-7 Score 0   Interventions:  Conducted a chart review Focused on rapport building Verbally administered PHQ-9 and GAD-7 for symptom monitoring Verbally administered Food & Mood questionnaire to assess various behaviors related to emotional eating Provided emphatic reflections and validation Collaborated with patient on a treatment goal  Psychoeducation provided regarding physical versus emotional hunger  Provisional DSM-5 Diagnosis(es): 307.59 (F50.8) Other Specified Feeding or Eating Disorder, Emotional Eating Behaviors  Plan: Narissa appears able and  willing to participate as evidenced by collaboration on a treatment goal, engagement in reciprocal conversation, and asking questions as needed for clarification. The next appointment will be scheduled in approximately two weeks, which will be via MyChart Video Visit. The following treatment goal was established: increase coping skills. This provider will regularly review the treatment plan and medical chart to keep informed of status changes. Doaa expressed understanding and agreement with the initial treatment plan of care. Milcah will be sent a handout via e-mail to utilize between now and the next appointment to increase awareness of hunger patterns and subsequent eating. Robertine provided verbal consent during today's appointment for this provider to send the handout via e-mail.

## 2020-10-16 LAB — CBC WITH DIFFERENTIAL/PLATELET
Basophils Absolute: 0.1 10*3/uL (ref 0.0–0.2)
Basos: 1 %
EOS (ABSOLUTE): 0.1 10*3/uL (ref 0.0–0.4)
Eos: 2 %
Hematocrit: 43.7 % (ref 34.0–46.6)
Hemoglobin: 14.1 g/dL (ref 11.1–15.9)
Immature Grans (Abs): 0 10*3/uL (ref 0.0–0.1)
Immature Granulocytes: 0 %
Lymphocytes Absolute: 2.2 10*3/uL (ref 0.7–3.1)
Lymphs: 36 %
MCH: 29.5 pg (ref 26.6–33.0)
MCHC: 32.3 g/dL (ref 31.5–35.7)
MCV: 91 fL (ref 79–97)
Monocytes Absolute: 0.5 10*3/uL (ref 0.1–0.9)
Monocytes: 8 %
Neutrophils Absolute: 3.4 10*3/uL (ref 1.4–7.0)
Neutrophils: 53 %
Platelets: 352 10*3/uL (ref 150–450)
RBC: 4.78 x10E6/uL (ref 3.77–5.28)
RDW: 13.4 % (ref 11.7–15.4)
WBC: 6.3 10*3/uL (ref 3.4–10.8)

## 2020-10-16 LAB — T4, FREE: Free T4: 1.03 ng/dL (ref 0.82–1.77)

## 2020-10-16 LAB — COMPREHENSIVE METABOLIC PANEL
ALT: 29 IU/L (ref 0–32)
AST: 22 IU/L (ref 0–40)
Albumin/Globulin Ratio: 1.5 (ref 1.2–2.2)
Albumin: 4.8 g/dL (ref 3.8–4.9)
Alkaline Phosphatase: 99 IU/L (ref 44–121)
BUN/Creatinine Ratio: 21 (ref 9–23)
BUN: 16 mg/dL (ref 6–24)
Bilirubin Total: 0.3 mg/dL (ref 0.0–1.2)
CO2: 22 mmol/L (ref 20–29)
Calcium: 9.8 mg/dL (ref 8.7–10.2)
Chloride: 101 mmol/L (ref 96–106)
Creatinine, Ser: 0.77 mg/dL (ref 0.57–1.00)
Globulin, Total: 3.2 g/dL (ref 1.5–4.5)
Glucose: 80 mg/dL (ref 65–99)
Potassium: 4.7 mmol/L (ref 3.5–5.2)
Sodium: 142 mmol/L (ref 134–144)
Total Protein: 8 g/dL (ref 6.0–8.5)
eGFR: 91 mL/min/{1.73_m2} (ref >59)

## 2020-10-16 LAB — VITAMIN B12: Vitamin B-12: 396 pg/mL (ref 232–1245)

## 2020-10-16 LAB — TSH: TSH: 2.54 u[IU]/mL (ref 0.450–4.500)

## 2020-10-16 LAB — LIPID PANEL
Chol/HDL Ratio: 3.2 ratio (ref 0.0–4.4)
Cholesterol, Total: 266 mg/dL — ABNORMAL HIGH (ref 100–199)
HDL: 83 mg/dL (ref >39)
LDL Chol Calc (NIH): 174 mg/dL — ABNORMAL HIGH (ref 0–99)
Triglycerides: 61 mg/dL (ref 0–149)
VLDL Cholesterol Cal: 9 mg/dL (ref 5–40)

## 2020-10-16 LAB — VITAMIN D 25 HYDROXY (VIT D DEFICIENCY, FRACTURES): Vit D, 25-Hydroxy: 32.2 ng/mL (ref 30.0–100.0)

## 2020-10-16 LAB — INSULIN, RANDOM: INSULIN: 11.9 u[IU]/mL (ref 2.6–24.9)

## 2020-10-16 LAB — FOLATE: Folate: 11.1 ng/mL (ref >3.0)

## 2020-10-16 LAB — T3: T3, Total: 124 ng/dL (ref 71–180)

## 2020-10-21 ENCOUNTER — Telehealth (INDEPENDENT_AMBULATORY_CARE_PROVIDER_SITE_OTHER): Payer: No Typology Code available for payment source | Admitting: Psychology

## 2020-10-21 DIAGNOSIS — F5089 Other specified eating disorder: Secondary | ICD-10-CM | POA: Diagnosis not present

## 2020-10-23 NOTE — Progress Notes (Signed)
Chief Complaint:   OBESITY Andrea Alvarez (MR# [id number]) is a 55 y.o. female who presents for evaluation and treatment of obesity and related comorbidities. Current BMI is Body mass index is 36.03 kg/m. Andrea Alvarez has been struggling with her weight for many years and has been unsuccessful in either losing weight, maintaining weight loss, or reaching her healthy weight goal.  Andrea Alvarez is currently in the action stage of change and ready to dedicate time achieving and maintaining a healthier weight. Andrea Alvarez is interested in becoming our patient and working on intensive lifestyle modifications including (but not limited to) diet and exercise for weight loss.  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  Her 69 year old son, Andrea Alvarez, lives with her.  She is single.  She would ike to lose 50-60 pounds in 9 months.  Craves bread, especially in the evenings.  Does not snack.  Eats out 2-4 days per week.  Worst habit is lack of planning and meal prepping and then going out to eat as a consequence.   Andrea Alvarez's habits were reviewed today and are as follows: her desired weight loss is 56 pounds, she has been heavy most of her life, she started gaining excessive weight after having her tubes tied, her heaviest weight ever was 220 pounds, she craves bread, she is frequently drinking liquids with calories, she frequently makes poor food choices, she frequently eats larger portions than normal and she struggles with emotional eating.  Depression Screen Andrea Alvarez's Food and Mood (modified PHQ-9) score was 6.  Depression screen Andrea Alvarez 2/9 10/15/2020  Decreased Interest 1  Down, Depressed, Hopeless 0  PHQ - 2 Score 1  Altered sleeping 1  Tired, decreased energy 3  Change in appetite 1  Feeling bad or failure about yourself  0  Trouble concentrating 0  Moving slowly or fidgety/restless 0  Suicidal thoughts 0  PHQ-9 Score 6  Difficult doing work/chores Not difficult at all   Assessment/Plan:   1. Other  fatigue Andrea Alvarez denies daytime somnolence and reports waking up still tired. Patent has a history of symptoms of morning fatigue, morning headache and snoring. [name] generally gets 6 or 7 hours of sleep per night, and states that she has generally restful sleep. Snoring is present. Apneic episodes are not present. Epworth Sleepiness Score is 11.  Andrea Alvarez does feel that her weight is causing her energy to be lower than it should be. Fatigue may be related to obesity, depression or many other causes. Labs will be ordered, and in the meanwhile, Andrea Alvarez will focus on self care including making healthy food choices, increasing physical activity and focusing on stress reduction.  EKG and labs today.  We discussed ESS results, and due to increased score, I recommend she discuss possible OSA evaluation with her PCP.  - EKG 12-Lead - Vitamin B12 - CBC with Differential/Platelet - Comprehensive metabolic panel - Folate - T3 - T4, free - TSH  2. SOBOE (shortness of breath on exertion) Andrea Alvarez notes increasing shortness of breath with exercising and seems to be worsening over time with weight gain. She notes getting out of breath sooner with activity than she used to. This has gotten worse recently. Harmoney denies shortness of breath at rest or orthopnea.  [name] does feel that she gets out of breath more easily that she used to when she exercises. Andrea Alvarez's shortness of breath appears to be obesity related and exercise induced. She has agreed to work on weight loss and gradually increase exercise to treat  her exercise induced shortness of breath. Will continue to monitor closely.  Check IC and labs today.  - Vitamin B12 - CBC with Differential/Platelet - Comprehensive metabolic panel - Folate - T3 - T4, free - TSH  3. Insulin resistance Not at goal. Goal is HgbA1c < 5.7, fasting insulin closer to 5.  Medication: None.  History of metformin use in the past with [place].  Last seen by Adah Salvage, FNP, on  09/25/2018.  A1c was never elevated and was 5.2 in 06/2020.  She has lost 5% of her weight since then.   Plan:  She will continue to focus on protein-rich, low simple carbohydrate foods. We reviewed the importance of hydration, regular exercise for stress reduction, and restorative sleep.  Check insulin level.  Start prudent nutritional plan, weight loss, eventually increase exercise.  Lab Results  Component Value Date   HGBA1C 5.2 07/04/2020   Lab Results  Component Value Date   INSULIN 11.9 10/15/2020   INSULIN 13.8 08/28/2018   INSULIN 10.8 05/16/2018   - Comprehensive metabolic panel - Insulin, random  4. Vitamin D deficiency Andrea Alvarez is not taking a vitamin D supplement.  Optimal goal > 50 ng/dL.   Plan:  Will check vitamin D level today, as per below.  - VITAMIN D 25 Hydroxy (Vit-D Deficiency, Fractures)  5. Chronic constipation Andrea Alvarez takes OTC MiraLAX as needed most days.  Drinks 80-120 ounces of water per day.    Plan:  Check labs.  Continue OTC regimen.  This problem is uncontrolled. Keilany was informed that a decrease in bowel movement frequency is normal while losing weight, but stools should not be hard or painful.  Counseling: Getting to Good Bowel Health: Your goal is to have one soft bowel movement each day. Drink at least 8 glasses of water each day. Eat plenty of fiber (goal is over 30 grams each day). It is best to get most of your fiber from dietary sources which includes leafy green vegetables, fresh fruit, and whole grains. You may need to add fiber with the help of OTC fiber supplements. These include Metamucil, Citrucel, and Benefiber. If you are still having trouble, try adding an osmotic laxative such as Miralax. If all of these changes do not work, Dietitian.   6. Chronic back pain, unspecified back location, unspecified back pain laterality Waxes and wanes.  Stable currently.  Would like to lose weight to help with symptoms.  Plan:  Check labs  today.  Lose weight.  Increase walking/exercise eventually.  7. Other hyperlipidemia Course: Not at goal. Lipid-lowering medications: None.  LDL was 124 ~3 months ago, but HDL was >70.  She has lost ver 5% of there weight since those labs were drawn.  No medications in the past.  Plan: Dietary changes: Increase soluble fiber, decrease simple carbohydrates, decrease saturated fat. Exercise changes: Moderate to vigorous-intensity aerobic activity 150 minutes per week or as tolerated. We will continue to monitor along with PCP/specialists as it pertains to her weight loss journey.  Check labs today.  Lab Results  Component Value Date   CHOL 266 (H) 10/15/2020   HDL 83 10/15/2020   LDLCALC 174 (H) 10/15/2020   TRIG 61 10/15/2020   CHOLHDL 3.2 10/15/2020   Lab Results  Component Value Date   ALT 29 10/15/2020   AST 22 10/15/2020   ALKPHOS 99 10/15/2020   BILITOT 0.3 10/15/2020   The 10-year ASCVD risk score Denman George DC Jr., et al., 2013) is: 1.1%  Values used to calculate the score:     Age: [age] years     Sex: Female     Is Non-Hispanic African American: No     Diabetic: No     Tobacco smoker: No     Systolic Blood Pressure: 100 mmHg     Is BP treated: No     HDL Cholesterol: 83 mg/dL     Total Cholesterol: 266 mg/dL  - Lipid panel  8. Other depression, with emotional eating Fairly well controlled.  With symptoms of depressed mood.  Medication: None.  PHQ-9 is 6.  She endorses using food for comfort.  Plan:  Behavior modification techniques were discussed today to help deal with emotional/non-hunger eating behaviors. Patient was referred to Dr. Dewaine CongerBarker, our Bariatric Psychologist, for evaluation due to her elevated PHQ-9 score and significant struggles with emotional eating.  9. At risk for heart disease Due to Eryka's current state of health and medical condition(s), she is at a higher risk for heart disease.  She has history of hyperlipidemia, insulin resistance, and elevated  blood pressure.  This puts the patient at much greater risk to subsequently develop cardiopulmonary conditions that can significantly affect patient's quality of life in a negative manner.    At least 22 minutes were spent on counseling Andrea DecemberSharon about these concerns today, and I stressed the importance of reversing risks factors of obesity, especially truncal and visceral fat, hypertension, hyperlipidemia, and pre-diabetes.  The initial goal is to lose at least 5-10% of starting weight to help reduce these risk factors.  Counseling:  Intensive lifestyle modifications were discussed with Andrea DecemberSharon as the most appropriate first line of treatment.  she will continue to work on diet, exercise, and weight loss efforts.  We will continue to reassess these conditions on a fairly regular basis in an attempt to decrease the patient's overall morbidity and mortality.  Evidence-based interventions for health behavior change were utilized today including the discussion of self monitoring techniques, problem-solving barriers, and SMART goal setting techniques.  Specifically, regarding patient's less desirable eating habits and patterns, we employed the technique of small changes when Andrea DecemberSharon has not been able to fully commit to her prudent nutritional plan.  10. Class 2 severe obesity with serious comorbidity and body mass index (BMI) of 36.0 to 36.9 in adult, unspecified obesity type [facility])  Andrea DecemberSharon is currently in the action stage of change and her goal is to continue with weight loss efforts. I recommend Andrea DecemberSharon begin the structured treatment plan as follows:  She has agreed to the Category 2 Plan.  Exercise goals: As is.   Behavioral modification strategies: increasing lean protein intake, decreasing simple carbohydrates, increasing water intake, increasing high fiber foods, meal planning and cooking strategies and planning for success.  She was informed of the importance of frequent follow-up visits to maximize her  success with intensive lifestyle modifications for her multiple health conditions. She was informed we would discuss her lab results at her next visit unless there is a critical issue that needs to be addressed sooner. Andrea DecemberSharon agreed to keep her next visit at the agreed upon time to discuss these results.  Objective:   Blood pressure 134/85, pulse 74, temperature 98 F (36.7 C), height 5\' 2"  (1.575 m), weight 197 lb (89.4 kg), last menstrual period 11/16/2016, SpO2 96 %. Body mass index is 36.03 kg/m.  EKG: Normal sinus rhythm, rate 72 bpm.  Indirect Calorimeter completed today shows a VO2 of 243 and a REE of 1694.  Her calculated basal metabolic rate is 3419 thus her basal metabolic rate is better than expected.  General: Cooperative, alert, well developed, in no acute distress. HEENT: Conjunctivae and lids unremarkable. Cardiovascular: Regular rhythm.  Lungs: Normal work of breathing. Neurologic: No focal deficits.   Lab Results  Component Value Date   CREATININE 0.77 10/15/2020   BUN 16 10/15/2020   NA 142 10/15/2020   K 4.7 10/15/2020   CL 101 10/15/2020   CO2 22 10/15/2020   Lab Results  Component Value Date   ALT 29 10/15/2020   AST 22 10/15/2020   ALKPHOS 99 10/15/2020   BILITOT 0.3 10/15/2020   Lab Results  Component Value Date   HGBA1C 5.2 07/04/2020   HGBA1C 5.3 07/02/2019   HGBA1C 5.4 08/28/2018   HGBA1C 5.2 05/16/2018   HGBA1C 5.2 02/01/2018   Lab Results  Component Value Date   INSULIN 11.9 10/15/2020   INSULIN 13.8 08/28/2018   INSULIN 10.8 05/16/2018   Lab Results  Component Value Date   TSH 2.540 10/15/2020   Lab Results  Component Value Date   CHOL 266 (H) 10/15/2020   HDL 83 10/15/2020   LDLCALC 174 (H) 10/15/2020   TRIG 61 10/15/2020   CHOLHDL 3.2 10/15/2020   Lab Results  Component Value Date   WBC 6.3 10/15/2020   HGB 14.1 10/15/2020   HCT 43.7 10/15/2020   MCV 91 10/15/2020   PLT 352 10/15/2020   Attestation Statements:   This  is the patient's first visit at [facility]. The patient's NEW PATIENT PACKET was reviewed at length. Included in the packet: current and past health history, medications, allergies, ROS, gynecologic history (women only), surgical history, family history, social history, weight history, weight loss surgery history (for those that have had weight loss surgery), nutritional evaluation, mood and food questionnaire, PHQ9, Epworth questionnaire, sleep habits questionnaire, patient life and health improvement goals questionnaire. These will all be scanned into the patient's chart under media.   During the visit, I independently reviewed the patient's EKG, bioimpedance scale results, and indirect calorimeter results. I used this information to tailor a meal plan for the patient that will help her to lose weight and will improve her obesity-related conditions going forward. I performed a medically necessary appropriate examination and/or evaluation. I discussed the assessment and treatment plan with the patient. The patient was provided an opportunity to ask questions and all were answered. The patient agreed with the plan and demonstrated an understanding of the instructions. Labs were ordered at this visit and will be reviewed at the next visit unless more critical results need to be addressed immediately. Clinical information was updated and documented in the EMR.   I, Insurance claims handler, CMA, am acting as Energy manager for [employer], DO.  I have reviewed the above documentation for accuracy and completeness, and I agree with the above. Carlye Grippe, D.O.  The 21st Century Cures Act was signed into law in 2016 which includes the topic of electronic health records.  This provides immediate access to information in MyChart.  This includes consultation notes, operative notes, office notes, lab results and pathology reports.  If you have any questions about what you read please let us know at  your next visit so we can discuss your concerns and take corrective action if need be.  We are right here with you.

## 2020-10-29 ENCOUNTER — Ambulatory Visit (INDEPENDENT_AMBULATORY_CARE_PROVIDER_SITE_OTHER): Payer: 59 | Admitting: Family Medicine

## 2020-10-30 ENCOUNTER — Other Ambulatory Visit: Payer: Self-pay

## 2020-10-30 ENCOUNTER — Encounter (INDEPENDENT_AMBULATORY_CARE_PROVIDER_SITE_OTHER): Payer: Self-pay | Admitting: Bariatrics

## 2020-10-30 ENCOUNTER — Ambulatory Visit (INDEPENDENT_AMBULATORY_CARE_PROVIDER_SITE_OTHER): Payer: No Typology Code available for payment source | Admitting: Bariatrics

## 2020-10-30 VITALS — BP 100/65 | HR 82 | Temp 98.0°F | Ht 62.0 in | Wt 196.0 lb

## 2020-10-30 DIAGNOSIS — E559 Vitamin D deficiency, unspecified: Secondary | ICD-10-CM

## 2020-10-30 DIAGNOSIS — Z6837 Body mass index (BMI) 37.0-37.9, adult: Secondary | ICD-10-CM

## 2020-10-30 DIAGNOSIS — E8881 Metabolic syndrome: Secondary | ICD-10-CM

## 2020-10-30 DIAGNOSIS — Z9189 Other specified personal risk factors, not elsewhere classified: Secondary | ICD-10-CM | POA: Diagnosis not present

## 2020-10-30 DIAGNOSIS — E7849 Other hyperlipidemia: Secondary | ICD-10-CM

## 2020-11-04 ENCOUNTER — Encounter (INDEPENDENT_AMBULATORY_CARE_PROVIDER_SITE_OTHER): Payer: Self-pay | Admitting: Bariatrics

## 2020-11-04 MED ORDER — VITAMIN D (ERGOCALCIFEROL) 1.25 MG (50000 UNIT) PO CAPS: 50000.0000 [IU] | ORAL_CAPSULE | ORAL | 0 refills | Status: DC

## 2020-11-04 NOTE — Progress Notes (Signed)
Chief Complaint:   OBESITY Andrea Alvarez is here to discuss her progress with her obesity treatment plan along with follow-up of her obesity related diagnoses. Andrea Alvarez is on the Category 2 Plan and states she is following her eating plan approximately 50% of the time. Andrea Alvarez states she is walking for 15 minutes 5 times per week.  Today's visit was #: 2 Starting weight: 197 lbs Starting date: 10/15/2020 Today's weight: 196 lbs Today's date: 10/30/2020 Total lbs lost to date: 1 lb Total lbs lost since last in-office visit: 1 lb  Interim History: Andrea Alvarez is down 1 pound since her last visit, but is only following the plan for 50% of the time.  She is not planning for the night.  Subjective:   1. Other hyperlipidemia Andrea Alvarez has hyperlipidemia and has been trying to improve her cholesterol levels with intensive lifestyle modification including a low saturated fat diet, exercise and weight loss. She denies any chest pain, claudication or myalgias.  She is on no medications for this.  Elevated total cholesterol and LDL.  Lab Results  Component Value Date   ALT 29 10/15/2020   AST 22 10/15/2020   ALKPHOS 99 10/15/2020   BILITOT 0.3 10/15/2020   Lab Results  Component Value Date   CHOL 266 (H) 10/15/2020   HDL 83 10/15/2020   LDLCALC 174 (H) 10/15/2020   TRIG 61 10/15/2020   CHOLHDL 3.2 10/15/2020   2. Vitamin D insufficiency Andrea Alvarez's Vitamin D level was 32.2 on 10/15/2020. She is currently taking no vitamin D supplement.   3. Insulin resistance Andrea Alvarez has a diagnosis of insulin resistance based on her elevated fasting insulin level >5. She continues to work on diet and exercise to decrease her risk of diabetes.  Insulin level 11.9.  Lab Results  Component Value Date   INSULIN 11.9 10/15/2020   INSULIN 13.8 08/28/2018   INSULIN 10.8 05/16/2018   Lab Results  Component Value Date   HGBA1C 5.2 07/04/2020   4. At risk for osteoporosis Andrea Alvarez is at higher risk of osteopenia and  osteoporosis due to Vitamin D deficiency.   Assessment/Plan:   1. Other hyperlipidemia Cardiovascular risk and specific lipid/LDL goals reviewed.  We discussed several lifestyle modifications today and Andrea Alvarez will continue to work on diet, exercise and weight loss efforts. Orders and follow up as documented in patient record.  Will follow over time.  Counseling Intensive lifestyle modifications are the first line treatment for this issue. . Dietary changes: Increase soluble fiber. Decrease simple carbohydrates. . Exercise changes: Moderate to vigorous-intensity aerobic activity 150 minutes per week if tolerated. . Lipid-lowering medications: see documented in medical record.  2. Vitamin D insufficiency Low Vitamin D level contributes to fatigue and are associated with obesity, breast, and colon cancer. She agrees to start to take prescription Vitamin D @50 ,000 IU every week and will follow-up for routine testing of Vitamin D, at least 2-3 times per year to avoid over-replacement.  Prescription has been sent to her pharmacy today.  3. Insulin resistance Andrea Alvarez will continue to work on weight loss, exercise, and decreasing simple carbohydrates to help decrease the risk of diabetes. Andrea Alvarez agreed to follow-up with Andrea Alvarez as directed to closely monitor her progress.  Handout on insulin resistance provided today.  4. At risk for osteoporosis Andrea Alvarez was given approximately 15 minutes of osteoporosis prevention counseling today. Andrea Alvarez is at risk for osteopenia and osteoporosis due to her Vitamin D deficiency. She was encouraged to take her Vitamin D and follow  her higher calcium diet and increase strengthening exercise to help strengthen her bones and decrease her risk of osteopenia and osteoporosis.  Repetitive spaced learning was employed today to elicit superior memory formation and behavioral change.  5. Obesity with current BMI 35.9  Andrea Alvarez is currently in the action stage of change. As such,  her goal is to continue with weight loss efforts. She has agreed to the Category 2 Plan.   She will work on being more adherent to the plan.  Labs from 10/15/2020, including CMP, lipid panel, vitain D, B12, CBC, and insulin, were reviewed with the patient today.  Exercise goals: Will join the gym.  Behavioral modification strategies: increasing lean protein intake, decreasing simple carbohydrates, increasing vegetables, increasing water intake, decreasing eating out, no skipping meals, meal planning and cooking strategies, keeping healthy foods in the home and planning for success.  Andrea Alvarez has agreed to follow-up with our clinic in 2 weeks with Adah Salvage, FNP. She was informed of the importance of frequent follow-up visits to maximize her success with intensive lifestyle modifications for her multiple health conditions.   Objective:   Blood pressure 100/65, pulse 82, temperature 98 F (36.7 C), height 5\' 2"  (1.575 m), weight 196 lb (88.9 kg), last menstrual period 11/16/2016, SpO2 97 %. Body mass index is 35.85 kg/m.  General: Cooperative, alert, well developed, in no acute distress. HEENT: Conjunctivae and lids unremarkable. Cardiovascular: Regular rhythm.  Lungs: Normal work of breathing. Neurologic: No focal deficits.   Lab Results  Component Value Date   CREATININE 0.77 10/15/2020   BUN 16 10/15/2020   NA 142 10/15/2020   K 4.7 10/15/2020   CL 101 10/15/2020   CO2 22 10/15/2020   Lab Results  Component Value Date   ALT 29 10/15/2020   AST 22 10/15/2020   ALKPHOS 99 10/15/2020   BILITOT 0.3 10/15/2020   Lab Results  Component Value Date   HGBA1C 5.2 07/04/2020   HGBA1C 5.3 07/02/2019   HGBA1C 5.4 08/28/2018   HGBA1C 5.2 05/16/2018   HGBA1C 5.2 02/01/2018   Lab Results  Component Value Date   INSULIN 11.9 10/15/2020   INSULIN 13.8 08/28/2018   INSULIN 10.8 05/16/2018   Lab Results  Component Value Date   TSH 2.540 10/15/2020   Lab Results  Component  Value Date   CHOL 266 (H) 10/15/2020   HDL 83 10/15/2020   LDLCALC 174 (H) 10/15/2020   TRIG 61 10/15/2020   CHOLHDL 3.2 10/15/2020   Lab Results  Component Value Date   WBC 6.3 10/15/2020   HGB 14.1 10/15/2020   HCT 43.7 10/15/2020   MCV 91 10/15/2020   PLT 352 10/15/2020   Attestation Statements:   Reviewed by clinician on day of visit: allergies, medications, problem list, medical history, surgical history, family history, social history, and previous encounter notes.  I, 10/17/2020, CMA, am acting as Insurance claims handler for Energy manager, DO  I have reviewed the above documentation for accuracy and completeness, and I agree with the above. [employer], DO

## 2020-11-05 ENCOUNTER — Telehealth (INDEPENDENT_AMBULATORY_CARE_PROVIDER_SITE_OTHER): Payer: No Typology Code available for payment source | Admitting: Psychology

## 2020-11-05 DIAGNOSIS — F5089 Other specified eating disorder: Secondary | ICD-10-CM | POA: Diagnosis not present

## 2020-11-05 NOTE — Progress Notes (Signed)
  Office: [phone]  /  Fax: [phone]    Date: November 05, 2020   Appointment Start Time: 3:56pm Duration: 21 minutes Provider: Lawerance Cruel, Psy.D. Type of Session: Individual Therapy  Location of Patient: Home Location of Provider: Provider's Home (private office) Type of Contact: Telepsychological Visit via MyChart Video Visit  Session Content: Andrea Alvarez is a 55 y.o. female presenting for a follow-up appointment to address the previously established treatment goal of increasing coping skills. Today's appointment was a telepsychological visit due to COVID-19. Andrea Alvarez provided verbal consent for today's telepsychological appointment and she is aware she is responsible for securing confidentiality on her end of the session. Prior to proceeding with today's appointment, Andrea Alvarez's physical location at the time of this appointment was obtained as well a phone number she could be reached at in the event of technical difficulties. Andrea Alvarez and this provider participated in today's telepsychological service.   This provider conducted a brief check-in. Andrea Alvarez shared she is "staying busy." Regarding eating, Andrea Alvarez reported she continues to "struggle at night" as it relates to dinner. Her eating habits were explored. It was reflected she is going long periods without eating during the day which contributes to grazing by the time it is dinner time. As such, consequences of not eating regularly as relates to well being and weight loss were discussed. She was also engaged in problem solving to help her with the aforementioned. She was receptive to having a snack between breakfast and lunch as well as between lunch and dinner. Moreover, strategies to help with dinner planning were discussed (e.g., doubling recipes; roasting vegetables for couple meals; leaving foods/snacks at work for easy access). Overall, Andrea Alvarez was receptive to today's appointment as evidenced by openness to sharing, responsiveness to feedback,  and willingness to implement discussed strategies .  Mental Status Examination:  Appearance: well groomed and appropriate hygiene  Behavior: appropriate to circumstances Mood: euthymic Affect: mood congruent Speech: normal in rate, volume, and tone Eye Contact: appropriate Psychomotor Activity: unable to assess  Gait: unable to assess Thought Process: linear, logical, and goal directed  Thought Content/Perception: no hallucinations, delusions, bizarre thinking or behavior reported or observed and no evidence or endorsement of suicidal and homicidal ideation, plan, and intent Orientation: time, person, place, and purpose of appointment Memory/Concentration: memory, attention, language, and fund of knowledge intact  Insight/Judgment: good  Interventions:  Conducted a brief chart review Provided empathic reflections and validation Employed supportive psychotherapy interventions to facilitate reduced distress and to improve coping skills with identified stressors Engaged patient in problem solving  DSM-5 Diagnosis(es): 307.59 (F50.8) Other Specified Feeding or Eating Disorder, Emotional Eating Behaviors  Treatment Goal & Progress: During the initial appointment with this provider, the following treatment goal was established: increase coping skills. Progress is limited, as Andrea Alvarez has just begun treatment with this provider; however, she denied instances of emotional eating since the last visit with this provider. She also demonstrated willingness and expressed confidence in her ability to implement discussed strategies.   Plan: Based on progress to date, the next appointment will be scheduled in approximately four weeks, which will be via MyChart Video Visit. The next session will focus on working towards the established treatment goal.

## 2020-11-11 NOTE — Progress Notes (Signed)
  Office: [phone]  /  Fax: [phone]    Date: Nov 25, 2020   Appointment Start Time: 2:35pm Duration: 25 minutes Provider: Lawerance Cruel, Psy.D. Type of Session: Individual Therapy  Location of Patient: Home Location of Provider: Provider's Home (private office) Type of Contact: Telepsychological Visit via MyChart Video Visit  Session Content: Andrea Alvarez is a 55 y.o. female presenting for a follow-up appointment to address the previously established treatment goal of increasing coping skills. Today's appointment was a telepsychological visit due to COVID-19. Andrea Alvarez provided verbal consent for today's telepsychological appointment and she is aware she is responsible for securing confidentiality on her end of the session. Prior to proceeding with today's appointment, Andrea Alvarez's physical location at the time of this appointment was obtained as well a phone number she could be reached at in the event of technical difficulties. Andrea Alvarez and this provider participated in today's telepsychological service. Of note, today's appointment was initiated late due to this provider. Today's appointment was switched to a regular telephone call with Andrea Alvarez's verbal consent at 2:40pm due to technical issues.   This provider conducted a brief check-in. Andrea Alvarez stated challenges with eating on Mother's Day, noting, "I went right back on the meal plan." She also discussed challenges with eating when family visits/get together, adding she does well during the week. Thus, psychoeducation regarding making better choices and engaging in portion control during the holidays/celebrations/get together's was provided. More specifically, this provider discussed the following strategies: coming to meals hungry, but not starving; avoid filling up on appetizers; managing portion sizes; not completely depriving yourself; making the plate colorful (e.g., vegetables); pacing yourself (e.g., waiting 10 minutes before going back for  seconds); taking advantage of the nutritious foods; practicing mindfulness; staying hydrated; and avoid bringing home leftovers. She also discussed a plan to set boundaries with her family. Andrea Alvarez shared game night is every weekend; therefore, she discussed a plan to implement discussed strategies this coming weekend. Andrea Alvarez was receptive to today's appointment as evidenced by openness to sharing, responsiveness to feedback, and willingness to implement discussed strategies .  Mental Status Examination:  Appearance: well groomed and appropriate hygiene  Behavior: appropriate to circumstances Mood: euthymic Affect: mood congruent Speech: normal in rate, volume, and tone Eye Contact: appropriate Psychomotor Activity: unable to assess  Gait: unable to assess Thought Process: linear, logical, and goal directed  Thought Content/Perception: no hallucinations, delusions, bizarre thinking or behavior reported or observed and no evidence or endorsement of suicidal and homicidal ideation, plan, and intent Orientation: time, person, place, and purpose of appointment Memory/Concentration: memory, attention, language, and fund of knowledge intact  Insight/Judgment: good  Interventions:  Conducted a brief chart review Provided empathic reflections and validation Employed supportive psychotherapy interventions to facilitate reduced distress and to improve coping skills with identified stressors Engaged patient in problem solving Psychoeducation provided regarding strategies for holidays/celebrations/get togethers  DSM-5 Diagnosis(es): F50.89 Other Specified Feeding or Eating Disorder, Emotional Eating Behaviors  Treatment Goal & Progress: During the initial appointment with this provider, the following treatment goal was established: increase coping skills. Andrea Alvarez has demonstrated progress in her goal as evidenced by increased awareness of hunger patterns. Andrea Alvarez also continues to demonstrate willingness  to engage in learned skill(s).  Plan: Based on continued overall progress, the next appointment will be scheduled in one month, which will be via MyChart Video Visit. The next session will focus on working towards the established treatment goal.

## 2020-11-12 ENCOUNTER — Other Ambulatory Visit: Payer: Self-pay

## 2020-11-12 ENCOUNTER — Encounter (INDEPENDENT_AMBULATORY_CARE_PROVIDER_SITE_OTHER): Payer: Self-pay | Admitting: Family Medicine

## 2020-11-12 ENCOUNTER — Ambulatory Visit (INDEPENDENT_AMBULATORY_CARE_PROVIDER_SITE_OTHER): Payer: No Typology Code available for payment source | Admitting: Family Medicine

## 2020-11-12 VITALS — BP 131/85 | HR 79 | Temp 97.4°F | Ht 62.0 in | Wt 195.0 lb

## 2020-11-12 DIAGNOSIS — Z6837 Body mass index (BMI) 37.0-37.9, adult: Secondary | ICD-10-CM | POA: Diagnosis not present

## 2020-11-12 DIAGNOSIS — E7849 Other hyperlipidemia: Secondary | ICD-10-CM | POA: Diagnosis not present

## 2020-11-13 NOTE — Progress Notes (Signed)
Chief Complaint:   OBESITY Andrea Alvarez is here to discuss her progress with her obesity treatment plan along with follow-up of her obesity related diagnoses. Andrea Alvarez is on the Category 2 Plan and states she is following her eating plan approximately 75% of the time. Andrea Alvarez states she is walking 45-60 minutes 3 times per week.  Today's visit was #: 3 Starting weight: 197 lbs Starting date: 10/15/2020 Today's weight: 195 lbs Today's date: 11/12/2020 Total lbs lost to date: 2 Total lbs lost since last in-office visit: 1  Interim History: Andrea Alvarez had a gap in care with [place] from March 2020 to March of this year. Andrea Alvarez says she gained 20 lbs during her hiatus from [place] but then lost it before coming back to [place]. She tends to be off plan on weekends [place]. Hunger is well controlled.  Subjective:   1. Other hyperlipidemia Andrea Alvarez's last LDL was elevated at 174. Her triglycerides and HDL are within normal limits. She is not on statin therapy.  Lab Results  Component Value Date   ALT 29 10/15/2020   AST 22 10/15/2020   ALKPHOS 99 10/15/2020   BILITOT 0.3 10/15/2020   Lab Results  Component Value Date   CHOL 266 (H) 10/15/2020   HDL 83 10/15/2020   LDLCALC 174 (H) 10/15/2020   TRIG 61 10/15/2020   CHOLHDL 3.2 10/15/2020    Assessment/Plan:   1. Other hyperlipidemia Cardiovascular risk and specific lipid/LDL goals reviewed.  We discussed several lifestyle modifications today and Andrea Alvarez will continue to work on diet, exercise and weight loss efforts. Orders and follow up as documented in patient record. Continue meal plan.  2. Obesity with current BMI 35.66  Andrea Alvarez is currently in the action stage of change. As such, her goal is to continue with weight loss efforts. She has agreed to the Category 2 Plan.   Discussed having 1 off plan meal per week, but adhering well to plan the rest of the time.  Exercise goals: As is  Behavioral modification  strategies: decreasing simple carbohydrates and meal planning and cooking strategies.  Andrea Alvarez has agreed to follow-up with our clinic in 3 weeks.   Objective:   Blood pressure 131/85, pulse 79, temperature (!) 97.4 F (36.3 C), height 5\' 2"  (1.575 m), weight 195 lb (88.5 kg), last menstrual period 11/16/2016, SpO2 97 %. Body mass index is 35.67 kg/m.  General: Cooperative, alert, well developed, in no acute distress. HEENT: Conjunctivae and lids unremarkable. Cardiovascular: Regular rhythm.  Lungs: Normal work of breathing. Neurologic: No focal deficits.   Lab Results  Component Value Date   CREATININE 0.77 10/15/2020   BUN 16 10/15/2020   NA 142 10/15/2020   K 4.7 10/15/2020   CL 101 10/15/2020   CO2 22 10/15/2020   Lab Results  Component Value Date   ALT 29 10/15/2020   AST 22 10/15/2020   ALKPHOS 99 10/15/2020   BILITOT 0.3 10/15/2020   Lab Results  Component Value Date   HGBA1C 5.2 07/04/2020   HGBA1C 5.3 07/02/2019   HGBA1C 5.4 08/28/2018   HGBA1C 5.2 05/16/2018   HGBA1C 5.2 02/01/2018   Lab Results  Component Value Date   INSULIN 11.9 10/15/2020   INSULIN 13.8 08/28/2018   INSULIN 10.8 05/16/2018   Lab Results  Component Value Date   TSH 2.540 10/15/2020   Lab Results  Component Value Date   CHOL 266 (H) 10/15/2020   HDL 83 10/15/2020   LDLCALC  174 (H) 10/15/2020   TRIG 61 10/15/2020   CHOLHDL 3.2 10/15/2020   Lab Results  Component Value Date   WBC 6.3 10/15/2020   HGB 14.1 10/15/2020   HCT 43.7 10/15/2020   MCV 91 10/15/2020   PLT 352 10/15/2020    Attestation Statements:   Reviewed by clinician on day of visit: allergies, medications, problem list, medical history, surgical history, family history, social history, and previous encounter notes.  Edmund Hilda, am acting as Energy manager for [place], FNP.  I have reviewed the above documentation for accuracy and completeness, and I agree with the above. -  Jesse Sans,  FNP

## 2020-11-18 ENCOUNTER — Encounter (INDEPENDENT_AMBULATORY_CARE_PROVIDER_SITE_OTHER): Payer: Self-pay | Admitting: Family Medicine

## 2020-11-25 ENCOUNTER — Telehealth (INDEPENDENT_AMBULATORY_CARE_PROVIDER_SITE_OTHER): Payer: No Typology Code available for payment source | Admitting: Psychology

## 2020-11-25 DIAGNOSIS — F5089 Other specified eating disorder: Secondary | ICD-10-CM | POA: Diagnosis not present

## 2020-11-30 ENCOUNTER — Other Ambulatory Visit (INDEPENDENT_AMBULATORY_CARE_PROVIDER_SITE_OTHER): Payer: Self-pay | Admitting: Bariatrics

## 2020-11-30 DIAGNOSIS — E559 Vitamin D deficiency, unspecified: Secondary | ICD-10-CM

## 2020-12-01 NOTE — Telephone Encounter (Signed)
Pt last seen by Dawn Whitmire, FNP.  

## 2020-12-03 ENCOUNTER — Encounter (INDEPENDENT_AMBULATORY_CARE_PROVIDER_SITE_OTHER): Payer: Self-pay | Admitting: Family Medicine

## 2020-12-03 ENCOUNTER — Ambulatory Visit (INDEPENDENT_AMBULATORY_CARE_PROVIDER_SITE_OTHER): Payer: No Typology Code available for payment source | Admitting: Family Medicine

## 2020-12-03 ENCOUNTER — Other Ambulatory Visit: Payer: Self-pay

## 2020-12-03 VITALS — BP 115/76 | HR 78 | Temp 98.5°F | Ht 62.0 in | Wt 198.0 lb

## 2020-12-03 DIAGNOSIS — E8881 Metabolic syndrome: Secondary | ICD-10-CM | POA: Diagnosis not present

## 2020-12-03 DIAGNOSIS — Z9189 Other specified personal risk factors, not elsewhere classified: Secondary | ICD-10-CM | POA: Diagnosis not present

## 2020-12-03 DIAGNOSIS — Z6837 Body mass index (BMI) 37.0-37.9, adult: Secondary | ICD-10-CM

## 2020-12-03 DIAGNOSIS — F3289 Other specified depressive episodes: Secondary | ICD-10-CM | POA: Diagnosis not present

## 2020-12-03 DIAGNOSIS — E88819 Insulin resistance, unspecified: Secondary | ICD-10-CM

## 2020-12-03 MED ORDER — OZEMPIC (0.25 OR 0.5 MG/DOSE) 2 MG/1.5ML ~~LOC~~ SOPN: 0.2500 mg | PEN_INJECTOR | SUBCUTANEOUS | 0 refills | Status: DC

## 2020-12-04 ENCOUNTER — Encounter (INDEPENDENT_AMBULATORY_CARE_PROVIDER_SITE_OTHER): Payer: Self-pay | Admitting: Family Medicine

## 2020-12-04 NOTE — Progress Notes (Signed)
Chief Complaint:   OBESITY Andrea Alvarez is here to discuss her progress with her obesity treatment plan along with follow-up of her obesity related diagnoses. Andrea Alvarez is on the Category 2 Plan and states she is following her eating plan approximately 75% of the time. Andrea Alvarez states she is at the gym for 30 minutes 2 times per week.  Today's visit was #: 4 Starting weight: 197 lbs Starting date: 10/15/2020 Today's weight: 198 lbs Today's date: 12/03/2020 Total lbs lost to date: 0 Total lbs lost since last in-office visit: 0  Interim History: Andrea Alvarez recently joined [employer] and she has also started disc golf. She struggles with sticking to the plan on the weekends when she goes to her cabin.  She packs her lunch for work. She is getting in adequate water. She says she had a few cookies at work today at a party.  Subjective:   1. Insulin resistance We discussed possibly starting GLP-1. Andrea Alvarez continues to work on diet and exercise to decrease her risk of diabetes.  She denies personal or family history of thyroid cancer, history of pancreatitis, or current cholelithiasis. She was informed of side effects.   Lab Results  Component Value Date   INSULIN 11.9 10/15/2020   INSULIN 13.8 08/28/2018   INSULIN 10.8 05/16/2018   Lab Results  Component Value Date   HGBA1C 5.2 07/04/2020   2. Other depression, with emotional eating Andrea Alvarez feels she does more "social eating" than anything else. She is seeing Dr. Dewaine Conger and she feels this has been beneficial. She denies depression.  3. At risk for side effect of medication Andrea Alvarez is at risk for drug side effects due to starting Ozempic.  Assessment/Plan:   1. Insulin resistance Vetra agreed to start Ozempic 0.25 mg weekly with no refills.   - Semaglutide,0.25 or 0.5MG /DOS, (OZEMPIC, 0.25 OR 0.5 MG/DOSE,) 2 MG/1.5ML SOPN; Inject 0.25 mg into the skin once a week.  Dispense: 1.5 mL; Refill: 0  2. Other depression, with emotional eating   Andrea Alvarez will continue to see Dr. Dewaine Conger.   3. At risk for side effect of medication Andrea Alvarez was given approximately 15 minutes of drug side effect counseling today.  We discussed side effect possibility and risk versus benefits. Andrea Alvarez agreed to the medication and will contact this office if these side effects are intolerable.  Repetitive spaced learning was employed today to elicit superior memory formation and behavioral change.  4. Obesity with current BMI 36.21 Andrea Alvarez is currently in the action stage of change. As such, her goal is to continue with weight loss efforts. She has agreed to the Category 2 Plan.   Exercise goals: As is.  Behavioral modification strategies: decreasing simple carbohydrates.  Andrea Alvarez has agreed to follow-up with our clinic in 3 weeks.   Objective:   Blood pressure 115/76, pulse 78, temperature 98.5 F (36.9 C), height 5\' 2"  (1.575 m), weight 198 lb (89.8 kg), last menstrual period 11/16/2016, SpO2 96 %. Body mass index is 36.21 kg/m.  General: Cooperative, alert, well developed, in no acute distress. HEENT: Conjunctivae and lids unremarkable. Cardiovascular: Regular rhythm.  Lungs: Normal work of breathing. Neurologic: No focal deficits.   Lab Results  Component Value Date   CREATININE 0.77 10/15/2020   BUN 16 10/15/2020   NA 142 10/15/2020   K 4.7 10/15/2020   CL 101 10/15/2020   CO2 22 10/15/2020   Lab Results  Component Value Date   ALT 29 10/15/2020   AST 22 10/15/2020  ALKPHOS 99 10/15/2020   BILITOT 0.3 10/15/2020   Lab Results  Component Value Date   HGBA1C 5.2 07/04/2020   HGBA1C 5.3 07/02/2019   HGBA1C 5.4 08/28/2018   HGBA1C 5.2 05/16/2018   HGBA1C 5.2 02/01/2018   Lab Results  Component Value Date   INSULIN 11.9 10/15/2020   INSULIN 13.8 08/28/2018   INSULIN 10.8 05/16/2018   Lab Results  Component Value Date   TSH 2.540 10/15/2020   Lab Results  Component Value Date   CHOL 266 (H) 10/15/2020   HDL 83 10/15/2020    LDLCALC 174 (H) 10/15/2020   TRIG 61 10/15/2020   CHOLHDL 3.2 10/15/2020   Lab Results  Component Value Date   WBC 6.3 10/15/2020   HGB 14.1 10/15/2020   HCT 43.7 10/15/2020   MCV 91 10/15/2020   PLT 352 10/15/2020   No results found for: IRON, TIBC, FERRITIN  Attestation Statements:   Reviewed by clinician on day of visit: allergies, medications, problem list, medical history, surgical history, family history, social history, and previous encounter notes.   Trude Mcburney, am acting as Energy manager for [place], FNP-C.  I have reviewed the above documentation for accuracy and completeness, and I agree with the above. -  Jesse Sans, FNP

## 2020-12-09 NOTE — Progress Notes (Unsigned)
  Office: [phone]  /  Fax: [phone]    Date: December 23, 2020   Appointment Start Time: *** Duration: *** minutes Provider: Lawerance Cruel, Psy.D. Type of Session: Individual Therapy  Location of Patient: {gbptloc:23249} Location of Provider: Provider's home (private office) Type of Contact: Telepsychological Visit via MyChart Video Visit  Session Content: This provider called Andrea Alvarez at 2:32pm as she did not present for the telepsychological appointment. *** As such, today's appointment was initiated *** minutes late.  Andrea Alvarez is a 55 y.o. female presenting for a follow-up appointment to address the previously established treatment goal of increasing coping skills. Today's appointment was a telepsychological visit due to COVID-19. Andrea Alvarez provided verbal consent for today's telepsychological appointment and she is aware she is responsible for securing confidentiality on her end of the session. Prior to proceeding with today's appointment, [name]'s physical location at the time of this appointment was obtained as well a phone number she could be reached at in the event of technical difficulties. [name] and this provider participated in today's telepsychological service.   This provider conducted a brief check-in. *** [name] was receptive to today's appointment as evidenced by openness to sharing, responsiveness to feedback, and {gbreceptiveness:23401}.  Mental Status Examination:  Appearance: {Appearance:22431} Behavior: {Behavior:22445} Mood: {gbmood:21757} Affect: {Affect:22436} Speech: {Speech:22432} Eye Contact: {Eye Contact:22433} Psychomotor Activity: {Motor Activity:22434} Gait: {gbgait:23404} Thought Process: {thought process:22448}  Thought Content/Perception: {disturbances:22451} Orientation: {Orientation:22437} Memory/Concentration: {gbcognition:22449} Insight/Judgment: {Insight:22446}  Interventions:  {Interventions for Progress Notes:23405}  DSM-5 Diagnosis(es):  F50.89 Other Specified Feeding or Eating Disorder, Emotional Eating Behaviors  Treatment Goal & Progress: During the initial appointment with this provider, the following treatment goal was established: increase coping skills. [name] has demonstrated progress in her goal as evidenced by {gbtxprogress:22839}. [name] also {gbtxprogress2:22951}.  Plan: The next appointment will be scheduled in {gbweeks:21758}, which will be {gbtxmodality:23402}. The next session will focus on {Plan for Next Appointment:23400}.

## 2020-12-10 ENCOUNTER — Telehealth: Payer: Self-pay

## 2020-12-10 NOTE — Telephone Encounter (Signed)
Patient advised and agrees to go to an urgent care for evaluation.

## 2020-12-10 NOTE — Telephone Encounter (Signed)
Need to go to urgent care

## 2020-12-10 NOTE — Telephone Encounter (Signed)
Copied from CRM [id number]. Topic: General - Other >> Dec 10, 2020  8:35 AM Andrea Alvarez wrote: Reason for CRM: Patient called in to say that she have not injured her right leg but have Alvarez pain in the back of it from the thigh to the calf area. Say upon taking Aleve she usually get some releif but today its just not working and the pain is getting worst. Please call Ph# [phone]  Please advise.

## 2020-12-23 ENCOUNTER — Telehealth (INDEPENDENT_AMBULATORY_CARE_PROVIDER_SITE_OTHER): Payer: Self-pay | Admitting: Psychology

## 2020-12-23 ENCOUNTER — Telehealth (INDEPENDENT_AMBULATORY_CARE_PROVIDER_SITE_OTHER): Payer: No Typology Code available for payment source | Admitting: Psychology

## 2020-12-23 NOTE — Telephone Encounter (Signed)
  Office: [phone]  /  Fax: [phone]  Date of Call: December 23, 2020  Time of Call: 2:32pm Duration of Call: ~3 minutes Provider: Lawerance Cruel, PsyD  CONTENT:  This provider called [name] to check-in as she did not present for today's MyChart Video Visit appointment at 2:30pm. [name] stated she thought the appointment was tomorrow, noting she would have to reschedule as she has a meeting at work. She was receptive to the one time no show fee waiver being applied to today's visit. A brief check in was conducted. Josslin reported she is "applying" the strategies shared by this provider and she recently started Ozempic which has helped reduce cravings. She denied having any questions/concerns. No evidence or endorsement of safety concerns.   PLAN: Aysia is scheduled for an appointment on January 12, 2021 at 2:30pm via MyChart Video Visit.

## 2020-12-24 ENCOUNTER — Ambulatory Visit (INDEPENDENT_AMBULATORY_CARE_PROVIDER_SITE_OTHER): Payer: No Typology Code available for payment source | Admitting: Bariatrics

## 2020-12-25 ENCOUNTER — Other Ambulatory Visit: Payer: Self-pay

## 2020-12-25 ENCOUNTER — Ambulatory Visit (INDEPENDENT_AMBULATORY_CARE_PROVIDER_SITE_OTHER): Payer: No Typology Code available for payment source | Admitting: Bariatrics

## 2020-12-25 VITALS — BP 122/81 | HR 70 | Temp 98.4°F | Ht 62.0 in | Wt 192.0 lb

## 2020-12-25 DIAGNOSIS — Z6836 Body mass index (BMI) 36.0-36.9, adult: Secondary | ICD-10-CM

## 2020-12-25 DIAGNOSIS — Z9189 Other specified personal risk factors, not elsewhere classified: Secondary | ICD-10-CM | POA: Diagnosis not present

## 2020-12-25 DIAGNOSIS — E8881 Metabolic syndrome: Secondary | ICD-10-CM

## 2020-12-25 DIAGNOSIS — E559 Vitamin D deficiency, unspecified: Secondary | ICD-10-CM

## 2020-12-25 MED ORDER — VITAMIN D (ERGOCALCIFEROL) 1.25 MG (50000 UNIT) PO CAPS: 50000.0000 [IU] | ORAL_CAPSULE | ORAL | 0 refills | Status: DC

## 2020-12-31 ENCOUNTER — Telehealth (INDEPENDENT_AMBULATORY_CARE_PROVIDER_SITE_OTHER): Payer: Self-pay | Admitting: Psychology

## 2020-12-31 NOTE — Telephone Encounter (Signed)
  Office: [phone]  /  Fax: [phone]  Date of Call: December 31, 2020  Time of Call: 8:22am Provider: Lawerance Cruel, PsyD  CONTENT: This provider called due to a scheduling conflict. Andrea Alvarez was supposed to be scheduled with this provider at 2:30pm on January 12, 2021; however, her appointment with Dr. Manson Passey conflicts with this time on that day. A HIPAA compliant voicemail was left requesting a call back.   PLAN: This provider will wait for Andrea Alvarez to call back.

## 2021-01-01 NOTE — Progress Notes (Signed)
Chief Complaint:   OBESITY Andrea Alvarez is here to discuss her progress with her obesity treatment plan along with follow-up of her obesity related diagnoses. Andrea Alvarez is on the Category 2 Plan and states she is following her eating plan approximately 75% of the time. Andrea Alvarez states she is on treadmill/elliptical 30 minutes 3 times per week.  Today's visit was #: 5 Starting weight: 197  lbs Starting date: 10/15/2020 Today's weight: 192 lbs  Today's date: 12/25/2020 Total lbs lost to date: 5 Total lbs lost since last in-office visit: 6  Interim History: Andrea Alvarez is down 6 pounds from her last visit. She states that Ozempic helps.  Subjective:   1. Vitamin D insufficiency Andrea Alvarez's Vitamin D level was 32.2 on 10/15/2020. She is currently taking prescription vitamin D 50,000 IU each week. She denies nausea, vomiting or muscle weakness.   2. Insulin resistance Andrea Alvarez has a diagnosis of insulin resistance based on her elevated fasting insulin level >5. She continues to work on diet and exercise to decrease her risk of diabetes.She is taking Ozempic. She does report an occasional metal taste.    Lab Results  Component Value Date   INSULIN 11.9 10/15/2020   INSULIN 13.8 08/28/2018   INSULIN 10.8 05/16/2018   Lab Results  Component Value Date   HGBA1C 5.2 07/04/2020   3. At risk for osteoporosis Andrea Alvarez is at higher risk of osteopenia and osteoporosis due to Vitamin D deficiency.    Assessment/Plan:   1. Vitamin D insufficiency Low Vitamin D level contributes to fatigue and are associated with obesity, breast, and colon cancer. She agrees to continue to take prescription Vitamin D @50 ,000 IU every week and will follow-up for routine testing of Vitamin D, at least 2-3 times per year to avoid over-replacement.  - Vitamin D, Ergocalciferol, (DRISDOL) 1.25 MG (50000 UNIT) CAPS capsule; Take 1 capsule (50,000 Units total) by mouth every 7 (seven) days.  Dispense: 4 capsule; Refill: 0  2. Insulin  resistance Andrea Alvarez will continue to work on weight loss, exercise, and decreasing simple carbohydrates to help decrease the risk of diabetes. Andrea Alvarez agreed to follow-up with [name] as directed to closely monitor her progress. Andrea Alvarez will continue Ozempic 0.25 mg subq once weekly.  3. At risk for osteoporosis Andrea Alvarez was given approximately 15 minutes of osteoporosis prevention counseling today. Andrea Alvarez is at risk for osteopenia and osteoporosis due to her Vitamin D deficiency. She was encouraged to take her Vitamin D and follow her higher calcium diet and increase strengthening exercise to help strengthen her bones and decrease her risk of osteopenia and osteoporosis.  Repetitive spaced learning was employed today to elicit superior memory formation and behavioral change.   4. Class 2 severe obesity with serious comorbidity and body mass index (BMI) of 36.0 to 36.9 in adult, unspecified obesity type [facility])   Andrea Alvarez is currently in the action stage of change. As such, her goal is to continue with weight loss efforts. She has agreed to the Category 2 Plan.   Meal planning Lower corbohydrates  Exercise goals: As is and going to the gym.  Behavioral modification strategies: increasing lean protein intake, decreasing simple carbohydrates, increasing vegetables, increasing water intake, decreasing eating out, no skipping meals, meal planning and cooking strategies, keeping healthy foods in the home, and planning for success.  Andrea Alvarez has agreed to follow-up with our clinic in 2 weeks. She was informed of the importance of frequent follow-up visits to maximize her success with intensive lifestyle modifications for her  multiple health conditions.   Objective:   Blood pressure 122/81, pulse 70, temperature 98.4 F (36.9 C), height 5\' 2"  (1.575 m), weight 192 lb (87.1 kg), last menstrual period 11/16/2016, SpO2 98 %. Body mass index is 35.12 kg/m.  General: Cooperative, alert, well developed, in no acute  distress. HEENT: Conjunctivae and lids unremarkable. Cardiovascular: Regular rhythm.  Lungs: Normal work of breathing. Neurologic: No focal deficits.   Lab Results  Component Value Date   CREATININE 0.77 10/15/2020   BUN 16 10/15/2020   NA 142 10/15/2020   K 4.7 10/15/2020   CL 101 10/15/2020   CO2 22 10/15/2020   Lab Results  Component Value Date   ALT 29 10/15/2020   AST 22 10/15/2020   ALKPHOS 99 10/15/2020   BILITOT 0.3 10/15/2020   Lab Results  Component Value Date   HGBA1C 5.2 07/04/2020   HGBA1C 5.3 07/02/2019   HGBA1C 5.4 08/28/2018   HGBA1C 5.2 05/16/2018   HGBA1C 5.2 02/01/2018   Lab Results  Component Value Date   INSULIN 11.9 10/15/2020   INSULIN 13.8 08/28/2018   INSULIN 10.8 05/16/2018   Lab Results  Component Value Date   TSH 2.540 10/15/2020   Lab Results  Component Value Date   CHOL 266 (H) 10/15/2020   HDL 83 10/15/2020   LDLCALC 174 (H) 10/15/2020   TRIG 61 10/15/2020   CHOLHDL 3.2 10/15/2020   Lab Results  Component Value Date   WBC 6.3 10/15/2020   HGB 14.1 10/15/2020   HCT 43.7 10/15/2020   MCV 91 10/15/2020   PLT 352 10/15/2020   No results found for: IRON, TIBC, FERRITIN  Attestation Statements:  Reviewed by clinician on day of visit: allergies, medications, problem list, medical history, surgical history, family history, social history, and previous encounter notes.  I04/07/2020, CMA, am acting as [place] for Energy manager, DO.  I have reviewed the above documentation for accuracy and completeness, and I agree with the above. [employer], DO

## 2021-01-06 ENCOUNTER — Other Ambulatory Visit (INDEPENDENT_AMBULATORY_CARE_PROVIDER_SITE_OTHER): Payer: Self-pay | Admitting: Bariatrics

## 2021-01-06 ENCOUNTER — Other Ambulatory Visit (INDEPENDENT_AMBULATORY_CARE_PROVIDER_SITE_OTHER): Payer: Self-pay | Admitting: Family Medicine

## 2021-01-06 DIAGNOSIS — E8881 Metabolic syndrome: Secondary | ICD-10-CM

## 2021-01-06 MED ORDER — OZEMPIC (0.25 OR 0.5 MG/DOSE) 2 MG/1.5ML ~~LOC~~ SOPN: 0.2500 mg | PEN_INJECTOR | SUBCUTANEOUS | 0 refills | Status: DC

## 2021-01-06 NOTE — Telephone Encounter (Signed)
Refill request

## 2021-01-06 NOTE — Telephone Encounter (Signed)
Dr.Brown 

## 2021-01-07 ENCOUNTER — Encounter (INDEPENDENT_AMBULATORY_CARE_PROVIDER_SITE_OTHER): Payer: Self-pay | Admitting: Bariatrics

## 2021-01-12 ENCOUNTER — Other Ambulatory Visit (INDEPENDENT_AMBULATORY_CARE_PROVIDER_SITE_OTHER): Payer: Self-pay | Admitting: Family Medicine

## 2021-01-12 ENCOUNTER — Encounter (INDEPENDENT_AMBULATORY_CARE_PROVIDER_SITE_OTHER): Payer: Self-pay | Admitting: Bariatrics

## 2021-01-12 ENCOUNTER — Ambulatory Visit (INDEPENDENT_AMBULATORY_CARE_PROVIDER_SITE_OTHER): Payer: No Typology Code available for payment source | Admitting: Bariatrics

## 2021-01-12 ENCOUNTER — Other Ambulatory Visit: Payer: Self-pay

## 2021-01-12 VITALS — BP 125/78 | HR 97 | Temp 98.0°F | Ht 62.0 in | Wt 192.0 lb

## 2021-01-12 DIAGNOSIS — E88819 Insulin resistance, unspecified: Secondary | ICD-10-CM

## 2021-01-12 DIAGNOSIS — E559 Vitamin D deficiency, unspecified: Secondary | ICD-10-CM

## 2021-01-12 DIAGNOSIS — E8881 Metabolic syndrome: Secondary | ICD-10-CM

## 2021-01-12 DIAGNOSIS — E66812 Morbid (severe) obesity due to excess calories: Secondary | ICD-10-CM

## 2021-01-12 DIAGNOSIS — Z6836 Body mass index (BMI) 36.0-36.9, adult: Secondary | ICD-10-CM

## 2021-01-12 DIAGNOSIS — Z9189 Other specified personal risk factors, not elsewhere classified: Secondary | ICD-10-CM | POA: Diagnosis not present

## 2021-01-12 MED ORDER — VITAMIN D (ERGOCALCIFEROL) 1.25 MG (50000 UNIT) PO CAPS: 50000.0000 [IU] | ORAL_CAPSULE | ORAL | 0 refills | Status: DC

## 2021-01-12 MED ORDER — OZEMPIC (0.25 OR 0.5 MG/DOSE) 2 MG/1.5ML ~~LOC~~ SOPN: 0.5000 mg | PEN_INJECTOR | SUBCUTANEOUS | 0 refills | Status: DC

## 2021-01-14 NOTE — Progress Notes (Signed)
Chief Complaint:   OBESITY Andrea Alvarez is here to discuss her progress with her obesity treatment plan along with follow-up of her obesity related diagnoses. Andrea Alvarez is on the Category 2 Plan and states she is following her eating plan approximately 70% of the time. Andrea Alvarez states she is walking and using weighted hula hoop 20-30 minutes 3-5 times per week.  Today's visit was #: 6 Starting weight: 197 lbs Starting date: 10/15/2020 Today's weight: 192 lbs Today's date: 01/12/2021 Total lbs lost to date: 5 Total lbs lost since last in-office visit: 0  Interim History: Andrea Alvarez's weight remains the same. She has been getting less water.  Subjective:   1. Vitamin D insufficiency She is currently taking prescription vitamin D 50,000 IU each week. She denies nausea, vomiting or muscle weakness.  Lab Results  Component Value Date   VD25OH 32.2 10/15/2020   VD25OH 42.2 08/28/2018   VD25OH 24.4 (L) 05/16/2018   2. Insulin resistance Andrea Alvarez is taking Ozempic and denies side effects.  Lab Results  Component Value Date   INSULIN 11.9 10/15/2020   INSULIN 13.8 08/28/2018   INSULIN 10.8 05/16/2018   Lab Results  Component Value Date   HGBA1C 5.2 07/04/2020    3. At risk for side effect of medication Andrea Alvarez is at risk for side effects of medication.  Assessment/Plan:   1. Vitamin D insufficiency Low Vitamin D level contributes to fatigue and are associated with obesity, breast, and colon cancer. She agrees to continue to take prescription Vitamin D @50 ,000 IU every week and will follow-up for routine testing of Vitamin D, at least 2-3 times per year to avoid over-replacement.  Refill- Vitamin D, Ergocalciferol, (DRISDOL) 1.25 MG (50000 UNIT) CAPS capsule; Take 1 capsule (50,000 Units total) by mouth every 7 (seven) days.  Dispense: 4 capsule; Refill: 0  2. Insulin resistance Andrea Alvarez will continue to work on weight loss, exercise, and decreasing simple carbohydrates to help decrease  the risk of diabetes. Andrea Alvarez agreed to follow-up with Andrea Alvarez as directed to closely monitor her progress.  Refill- Semaglutide,0.25 or 0.5MG /DOS, (OZEMPIC, 0.25 OR 0.5 MG/DOSE,) 2 MG/1.5ML SOPN; Inject 0.5 mg into the skin once a week.  Dispense: 4.5 mL; Refill: 0  3. At risk for side effect of medication Andrea Alvarez was given approximately 15 minutes of drug side effect counseling today.  We discussed side effect possibility and risk versus benefits. Andrea Alvarez agreed to the medication and will contact this office if these side effects are intolerable.  Repetitive spaced learning was employed today to elicit superior memory formation and behavioral change.   4. Obesity, current BMI 35.2  Andrea Alvarez is currently in the action stage of change. As such, her goal is to continue with weight loss efforts. She has agreed to the Category 2 Plan.   Meal plan Will stick closely with the plan Increase water intake.  Exercise goals:  As is  Behavioral modification strategies: increasing lean protein intake, decreasing simple carbohydrates, increasing vegetables, increasing water intake, decreasing eating out, no skipping meals, meal planning and cooking strategies, keeping healthy foods in the home, and planning for success.  Andrea Alvarez has agreed to follow-up with our clinic in 2-3 weeks with Andrea Alvarez. She was informed of the importance of frequent follow-up visits to maximize her success with intensive lifestyle modifications for her multiple health conditions.   Objective:   Blood pressure 125/78, pulse 97, temperature 98 F (36.7 C), height 5\' 2"  (1.575 m), weight 192 lb (87.1 kg), last menstrual period 11/16/2016,  SpO2 97 %. Body mass index is 35.12 kg/m.  General: Cooperative, alert, well developed, in no acute distress. HEENT: Conjunctivae and lids unremarkable. Cardiovascular: Regular rhythm.  Lungs: Normal work of breathing. Neurologic: No focal deficits.   Lab Results  Component Value Date   CREATININE  0.77 10/15/2020   BUN 16 10/15/2020   NA 142 10/15/2020   K 4.7 10/15/2020   CL 101 10/15/2020   CO2 22 10/15/2020   Lab Results  Component Value Date   ALT 29 10/15/2020   AST 22 10/15/2020   ALKPHOS 99 10/15/2020   BILITOT 0.3 10/15/2020   Lab Results  Component Value Date   HGBA1C 5.2 07/04/2020   HGBA1C 5.3 07/02/2019   HGBA1C 5.4 08/28/2018   HGBA1C 5.2 05/16/2018   HGBA1C 5.2 02/01/2018   Lab Results  Component Value Date   INSULIN 11.9 10/15/2020   INSULIN 13.8 08/28/2018   INSULIN 10.8 05/16/2018   Lab Results  Component Value Date   TSH 2.540 10/15/2020   Lab Results  Component Value Date   CHOL 266 (H) 10/15/2020   HDL 83 10/15/2020   LDLCALC 174 (H) 10/15/2020   TRIG 61 10/15/2020   CHOLHDL 3.2 10/15/2020   Lab Results  Component Value Date   VD25OH 32.2 10/15/2020   VD25OH 42.2 08/28/2018   VD25OH 24.4 (L) 05/16/2018   Lab Results  Component Value Date   WBC 6.3 10/15/2020   HGB 14.1 10/15/2020   HCT 43.7 10/15/2020   MCV 91 10/15/2020   PLT 352 10/15/2020   No results found for: IRON, TIBC, FERRITIN  Attestation Statements:   Reviewed by clinician on day of visit: allergies, medications, problem list, medical history, surgical history, family history, social history, and previous encounter notes.  Edmund Hilda, CMA, am acting as Energy manager for [employer], DO.  I have reviewed the above documentation for accuracy and completeness, and I agree with the above. Corinna Capra, DO

## 2021-01-15 ENCOUNTER — Encounter (INDEPENDENT_AMBULATORY_CARE_PROVIDER_SITE_OTHER): Payer: Self-pay | Admitting: Bariatrics

## 2021-01-21 ENCOUNTER — Telehealth (INDEPENDENT_AMBULATORY_CARE_PROVIDER_SITE_OTHER): Payer: Self-pay

## 2021-01-21 ENCOUNTER — Other Ambulatory Visit (INDEPENDENT_AMBULATORY_CARE_PROVIDER_SITE_OTHER): Payer: Self-pay

## 2021-01-21 DIAGNOSIS — E8881 Metabolic syndrome: Secondary | ICD-10-CM

## 2021-01-21 MED ORDER — OZEMPIC (0.25 OR 0.5 MG/DOSE) 2 MG/1.5ML ~~LOC~~ SOPN: 0.5000 mg | PEN_INJECTOR | SUBCUTANEOUS | 0 refills | Status: DC

## 2021-01-21 NOTE — Telephone Encounter (Signed)
Dr.Brown 

## 2021-01-21 NOTE — Telephone Encounter (Signed)
[facility] called in and that that they dont accept electric prescriptions. They are requesting a fax to [phone], for this pts Ozempic. The lady did stated that a new from was faxed over stating this. Please advise

## 2021-01-21 NOTE — Telephone Encounter (Signed)
Rx faxed electonically

## 2021-01-22 ENCOUNTER — Encounter (INDEPENDENT_AMBULATORY_CARE_PROVIDER_SITE_OTHER): Payer: Self-pay | Admitting: Bariatrics

## 2021-01-22 NOTE — Telephone Encounter (Signed)
Pt last seen by Dr. Brown.  

## 2021-01-27 ENCOUNTER — Telehealth (INDEPENDENT_AMBULATORY_CARE_PROVIDER_SITE_OTHER): Payer: No Typology Code available for payment source | Admitting: Family Medicine

## 2021-01-27 ENCOUNTER — Encounter (INDEPENDENT_AMBULATORY_CARE_PROVIDER_SITE_OTHER): Payer: Self-pay | Admitting: Family Medicine

## 2021-01-27 ENCOUNTER — Other Ambulatory Visit: Payer: Self-pay

## 2021-01-27 DIAGNOSIS — Z6837 Body mass index (BMI) 37.0-37.9, adult: Secondary | ICD-10-CM

## 2021-01-27 DIAGNOSIS — E8881 Metabolic syndrome: Secondary | ICD-10-CM

## 2021-01-27 DIAGNOSIS — E88819 Insulin resistance, unspecified: Secondary | ICD-10-CM

## 2021-01-27 DIAGNOSIS — E559 Vitamin D deficiency, unspecified: Secondary | ICD-10-CM

## 2021-01-27 MED ORDER — OZEMPIC (0.25 OR 0.5 MG/DOSE) 2 MG/1.5ML ~~LOC~~ SOPN: 0.5000 mg | PEN_INJECTOR | SUBCUTANEOUS | 0 refills | Status: DC

## 2021-01-27 MED ORDER — VITAMIN D (ERGOCALCIFEROL) 1.25 MG (50000 UNIT) PO CAPS: 50000.0000 [IU] | ORAL_CAPSULE | ORAL | 0 refills | Status: DC

## 2021-02-03 NOTE — Progress Notes (Signed)
TeleHealth Visit:  Due to the COVID-19 pandemic, this visit was completed with telemedicine (audio/video) technology to reduce patient and provider exposure as well as to preserve personal protective equipment.   [place] has verbally consented to this TeleHealth visit. The patient is located at home, the provider is located at the [facility] office. The participants in this visit include the listed provider and patient. The visit was conducted today via MyChart video.   Chief Complaint: OBESITY Andrea Alvarez is here to discuss her progress with her obesity treatment plan along with follow-up of her obesity related diagnoses. Andrea Alvarez is on the Category 2 Plan and states she is following her eating plan approximately 70-75% of the time. Andrea Alvarez states she is doing 0 minutes 0 times per week.  Today's visit was #: 7 Starting weight: 197 lbs Starting date: 10/15/2020  Interim History: Andrea Alvarez is 191 lbs at home today. She notes her appetite is well controlled with Ozempic. She is not traveling as much on the weekends. Her protein intake is good but her water intake is low. She drinks plenty of unsweet tea.  Subjective:   1. Insulin resistance Leandra is taking Ozempic 0.25 mg weekly. She says she felt some anxiety when she first starting taking Ozempic which has now subsided. She denies nausea. She feels the Ozempic really helps with her appetite.  Lab Results  Component Value Date   INSULIN 11.9 10/15/2020   INSULIN 13.8 08/28/2018   INSULIN 10.8 05/16/2018   Lab Results  Component Value Date   HGBA1C 5.2 07/04/2020     2. Vitamin D insufficiency Andrea Alvarez's Vitamin D is low at 32.2. She is on Vitamin D weekly.  Lab Results  Component Value Date   VD25OH 32.2 10/15/2020   VD25OH 42.2 08/28/2018   VD25OH 24.4 (L) 05/16/2018   Assessment/Plan:   1. Insulin resistance  Andrea Alvarez agreed to continue taking Ozempic mg weekly. We will refill Ozempic for 1 month.  -  Semaglutide,0.25 or 0.5MG /DOS, (OZEMPIC, 0.25 OR 0.5 MG/DOSE,) 2 MG/1.5ML SOPN; Inject 0.5 mg into the skin once a week.  Dispense: 4.5 mL; Refill: 0  2. Vitamin D insufficiency We will refill Vitamin D for 1 month and she agrees to continue to take prescription Vitamin D 50,000 IU every week and will follow-up for routine testing of Vitamin D, at least 2-3 times per year to avoid over-replacement.  - Vitamin D, Ergocalciferol, (DRISDOL) 1.25 MG (50000 UNIT) CAPS capsule; Take 1 capsule (50,000 Units total) by mouth every 7 (seven) days.  Dispense: 4 capsule; Refill: 0  3. Obesity with current BMI 35.11 Andrea Alvarez is currently in the action stage of change. As such, her goal is to continue with weight loss efforts. She has agreed to the Category 2 Plan.   Exercise goals:  Andrea Alvarez will start back exercising.  Behavioral modification strategies: increasing lean protein intake, decreasing simple carbohydrates, and decreasing eating out.  Andrea Alvarez has agreed to follow-up with our clinic in 2 weeks. Objective:   VITALS: Per patient if applicable, see vitals. GENERAL: Alert and in no acute distress. CARDIOPULMONARY: No increased WOB. Speaking in clear sentences.  PSYCH: Pleasant and cooperative. Speech normal rate and rhythm. Affect is appropriate. Insight and judgement are appropriate. Attention is focused, linear, and appropriate.  NEURO: Oriented as arrived to appointment on time with no prompting.   Lab Results  Component Value Date   CREATININE 0.77 10/15/2020   BUN 16 10/15/2020   NA 142 10/15/2020   K  4.7 10/15/2020   CL 101 10/15/2020   CO2 22 10/15/2020   Lab Results  Component Value Date   ALT 29 10/15/2020   AST 22 10/15/2020   ALKPHOS 99 10/15/2020   BILITOT 0.3 10/15/2020   Lab Results  Component Value Date   HGBA1C 5.2 07/04/2020   HGBA1C 5.3 07/02/2019   HGBA1C 5.4 08/28/2018   HGBA1C 5.2 05/16/2018   HGBA1C 5.2 02/01/2018   Lab Results  Component Value Date    INSULIN 11.9 10/15/2020   INSULIN 13.8 08/28/2018   INSULIN 10.8 05/16/2018   Lab Results  Component Value Date   TSH 2.540 10/15/2020   Lab Results  Component Value Date   CHOL 266 (H) 10/15/2020   HDL 83 10/15/2020   LDLCALC 174 (H) 10/15/2020   TRIG 61 10/15/2020   CHOLHDL 3.2 10/15/2020   Lab Results  Component Value Date   VD25OH 32.2 10/15/2020   VD25OH 42.2 08/28/2018   VD25OH 24.4 (L) 05/16/2018   Lab Results  Component Value Date   WBC 6.3 10/15/2020   HGB 14.1 10/15/2020   HCT 43.7 10/15/2020   MCV 91 10/15/2020   PLT 352 10/15/2020   No results found for: IRON, TIBC, FERRITIN  Attestation Statements:   Reviewed by clinician on day of visit: allergies, medications, problem list, medical history, surgical history, family history, social history, and previous encounter notes.   I, Jackson Latino, RMA , am acting as Energy manager for [place], FNP.  I have reviewed the above documentation for accuracy and completeness, and I agree with the above. - Jesse Sans, FNP

## 2021-02-04 ENCOUNTER — Encounter (INDEPENDENT_AMBULATORY_CARE_PROVIDER_SITE_OTHER): Payer: Self-pay | Admitting: Family Medicine

## 2021-02-05 ENCOUNTER — Encounter (INDEPENDENT_AMBULATORY_CARE_PROVIDER_SITE_OTHER): Payer: Self-pay

## 2021-02-10 ENCOUNTER — Other Ambulatory Visit: Payer: Self-pay

## 2021-02-10 ENCOUNTER — Encounter (INDEPENDENT_AMBULATORY_CARE_PROVIDER_SITE_OTHER): Payer: Self-pay | Admitting: Family Medicine

## 2021-02-10 ENCOUNTER — Ambulatory Visit (INDEPENDENT_AMBULATORY_CARE_PROVIDER_SITE_OTHER): Payer: No Typology Code available for payment source | Admitting: Family Medicine

## 2021-02-10 VITALS — BP 108/72 | HR 97 | Temp 98.3°F | Ht 62.0 in | Wt 193.0 lb

## 2021-02-10 DIAGNOSIS — E7849 Other hyperlipidemia: Secondary | ICD-10-CM | POA: Diagnosis not present

## 2021-02-10 DIAGNOSIS — E8881 Metabolic syndrome: Secondary | ICD-10-CM | POA: Diagnosis not present

## 2021-02-10 DIAGNOSIS — E88819 Insulin resistance, unspecified: Secondary | ICD-10-CM

## 2021-02-10 DIAGNOSIS — Z6837 Body mass index (BMI) 37.0-37.9, adult: Secondary | ICD-10-CM

## 2021-02-15 NOTE — Progress Notes (Signed)
Chief Complaint:   OBESITY Andrea Alvarez is here to discuss her progress with her obesity treatment plan along with follow-up of her obesity related diagnoses. Andrea Alvarez is on the Category 2 Plan and states she is following her eating plan approximately 50% of the time. Andrea Alvarez states she is doing 0 minutes 0 times per week.  Today's visit was #: 8 Starting weight: 197 lbs Starting date: 10/15/2020 Today's weight: 193 lbs Today's date: 02/10/2021 Total lbs lost to date: 4 lbs Total lbs lost since last in-office visit: 0  Interim History: Andrea Alvarez say she has eaten out a few times and admits having pita and yeast rolls. She is getting in adequate protein. She does not have sugar sweet beverages. She also says she need to exercise. She will be quitting her 2nd job in September which will give her more free time.  Subjective:   1. Insulin resistance Andrea Alvarez has been taking 0.375 mg of Ozempic weekly. She does not notice appetite suppression or early satiety. She denies nausea or constipation.  Lab Results  Component Value Date   INSULIN 11.9 10/15/2020   INSULIN 13.8 08/28/2018   INSULIN 10.8 05/16/2018   Lab Results  Component Value Date   HGBA1C 5.2 07/04/2020    2. Other hyperlipidemia Luceil's last HDL level was elevated at  174. Her Triglycerides and HDL was within normal limits on statin.  Lab Results  Component Value Date   CHOL 266 (H) 10/15/2020   HDL 83 10/15/2020   LDLCALC 174 (H) 10/15/2020   TRIG 61 10/15/2020   CHOLHDL 3.2 10/15/2020   Lab Results  Component Value Date   ALT 29 10/15/2020   AST 22 10/15/2020   ALKPHOS 99 10/15/2020   BILITOT 0.3 10/15/2020   The 10-year ASCVD risk score Andrea George DC Jr., Andrea al., Andrea Alvarez) is: 1.3%   Values used to calculate the score:     Age: 55 years     Sex: Female     Is Non-Hispanic African American: No     Diabetic: No     Tobacco smoker: No     Systolic Blood Pressure: 108 mmHg     Is BP treated: No     HDL Cholesterol: 83  mg/dL     Total Cholesterol: 266 mg/dL    Assessment/Plan:   1. Insulin resistance Andrea Alvarez will continue Ozempic and she will increase dose to 0.5 mg weekly.   2. Other hyperlipidemia Cardiovascular risk and specific lipid/LDL goals reviewed.  We discussed several lifestyle modifications today and Andrea Alvarez will continue to work on diet, exercise and weight loss efforts.  Check FLP in about 3 weeks.  3. Obesity with current BMI 35.29 Andrea Alvarez is currently in the action stage of change. As such, her goal is to continue with weight loss efforts. She has agreed to the Category 2 Plan.   Exercise goals:  Exercise 3 times a week for 30 mintues.  Behavioral modification strategies: decreasing simple carbohydrates and decreasing eating out.  Andrea Alvarez has agreed to follow-up with our clinic in 3 weeks.  Objective:   Blood pressure 108/72, pulse 97, temperature 98.3 F (36.8 C), height 5\' 2"  (1.575 m), weight 193 lb (87.5 kg), last menstrual period 11/16/2016, SpO2 97 %. Body mass index is 35.3 kg/m.  General: Cooperative, alert, well developed, in no acute distress. HEENT: Conjunctivae and lids unremarkable. Cardiovascular: Regular rhythm.  Lungs: Normal work of breathing. Neurologic: No focal deficits.   Lab Results  Component Value Date  CREATININE 0.77 10/15/2020   BUN 16 10/15/2020   NA 142 10/15/2020   K 4.7 10/15/2020   CL 101 10/15/2020   CO2 22 10/15/2020   Lab Results  Component Value Date   ALT 29 10/15/2020   AST 22 10/15/2020   ALKPHOS 99 10/15/2020   BILITOT 0.3 10/15/2020   Lab Results  Component Value Date   HGBA1C 5.2 07/04/2020   HGBA1C 5.3 07/02/2019   HGBA1C 5.4 08/28/2018   HGBA1C 5.2 05/16/2018   HGBA1C 5.2 02/01/2018   Lab Results  Component Value Date   INSULIN 11.9 10/15/2020   INSULIN 13.8 08/28/2018   INSULIN 10.8 05/16/2018   Lab Results  Component Value Date   TSH 2.540 10/15/2020   Lab Results  Component Value Date   CHOL 266 (H)  10/15/2020   HDL 83 10/15/2020   LDLCALC 174 (H) 10/15/2020   TRIG 61 10/15/2020   CHOLHDL 3.2 10/15/2020   Lab Results  Component Value Date   VD25OH 32.2 10/15/2020   VD25OH 42.2 08/28/2018   VD25OH 24.4 (L) 05/16/2018   Lab Results  Component Value Date   WBC 6.3 10/15/2020   HGB 14.1 10/15/2020   HCT 43.7 10/15/2020   MCV 91 10/15/2020   PLT 352 10/15/2020   No results found for: IRON, TIBC, FERRITIN  Attestation Statements:   Reviewed by clinician on day of visit: allergies, medications, problem list, medical history, surgical history, family history, social history, and previous encounter notes.  Time spent on visit including pre-visit chart review and post-visit care and charting was 31 minutes.   I, Jackson Latino, RMA, am acting as Energy manager for [place], FNP.   I have reviewed the above documentation for accuracy and completeness, and I agree with the above. -  Jesse Sans, FNP

## 2021-02-17 DIAGNOSIS — R42 Dizziness and giddiness: Secondary | ICD-10-CM | POA: Insufficient documentation

## 2021-02-27 ENCOUNTER — Other Ambulatory Visit (INDEPENDENT_AMBULATORY_CARE_PROVIDER_SITE_OTHER): Payer: Self-pay | Admitting: Family Medicine

## 2021-02-27 DIAGNOSIS — E559 Vitamin D deficiency, unspecified: Secondary | ICD-10-CM

## 2021-03-04 ENCOUNTER — Other Ambulatory Visit: Payer: Self-pay

## 2021-03-04 ENCOUNTER — Ambulatory Visit (INDEPENDENT_AMBULATORY_CARE_PROVIDER_SITE_OTHER): Payer: No Typology Code available for payment source | Admitting: Family Medicine

## 2021-03-04 ENCOUNTER — Encounter (INDEPENDENT_AMBULATORY_CARE_PROVIDER_SITE_OTHER): Payer: Self-pay | Admitting: Family Medicine

## 2021-03-04 VITALS — BP 104/69 | HR 75 | Temp 97.8°F | Ht 62.0 in | Wt 188.0 lb

## 2021-03-04 DIAGNOSIS — E8881 Metabolic syndrome: Secondary | ICD-10-CM

## 2021-03-04 DIAGNOSIS — E88819 Insulin resistance, unspecified: Secondary | ICD-10-CM

## 2021-03-04 DIAGNOSIS — Z6837 Body mass index (BMI) 37.0-37.9, adult: Secondary | ICD-10-CM | POA: Diagnosis not present

## 2021-03-05 ENCOUNTER — Encounter (INDEPENDENT_AMBULATORY_CARE_PROVIDER_SITE_OTHER): Payer: Self-pay | Admitting: Family Medicine

## 2021-03-05 NOTE — Progress Notes (Signed)
Chief Complaint:   OBESITY Andrea Alvarez is here to discuss her progress with her obesity treatment plan along with follow-up of her obesity related diagnoses. Andrea Alvarez is on the Category 2 Plan and states she is following her eating plan approximately 75% of the time. Andrea Alvarez states she is doing dance classes for 30 minutes and treadmill for 60 minutes 2 times per week.  Today's visit was #: 9 Starting weight: 197 lbs Starting date: 10/15/2020 Today's weight: 188 lbs Today's date:03/04/2021 Total lbs lost to date: 9 lbs Total lbs lost since last in-office visit: 5 lbs  Interim History: Andrea Alvarez has done well on plan. She is making sure she is getting her protein in. She is taking a shag class which has increased her physical activity. She is doing great with water intake (unsweet tea). Andrea Alvarez does tend to have a carb at dinner (rice) but no bread.  Subjective:   1. Insulin resistance Andrea Alvarez's Ozempic dose was increased at last office visit to 0.5 mg. She notes that hunger is mostly satisfied.  She has had some mild nausea.  Lab Results  Component Value Date   INSULIN 11.9 10/15/2020   INSULIN 13.8 08/28/2018   INSULIN 10.8 05/16/2018   Lab Results  Component Value Date   HGBA1C 5.2 07/04/2020     Assessment/Plan:   1. Insulin resistance Andrea Alvarez will continue Ozempic at 0.5 mg weekly.   2. Obesity with current BMI 34.38 Andrea Alvarez is currently in the action stage of change. As such, her goal is to continue with weight loss efforts. She has agreed to the Category 2 Plan and keeping a food journal and adhering to recommended goals of 400-500 calories and 35 grams of protein at supper.   Exercise goals:  As is.  Behavioral modification strategies: increasing lean protein intake.  Andrea Alvarez has agreed to follow-up with our clinic in 3-4 weeks.  Objective:   Blood pressure 104/69, pulse 75, temperature 97.8 F (36.6 C), height 5\' 2"  (1.575 m), weight 188 lb (85.3 kg), last menstrual  period 11/16/2016, SpO2 98 %. Body mass index is 34.39 kg/m.  General: Cooperative, alert, well developed, in no acute distress. HEENT: Conjunctivae and lids unremarkable. Cardiovascular: Regular rhythm.  Lungs: Normal work of breathing. Neurologic: No focal deficits.   Lab Results  Component Value Date   CREATININE 0.77 10/15/2020   BUN 16 10/15/2020   NA 142 10/15/2020   K 4.7 10/15/2020   CL 101 10/15/2020   CO2 22 10/15/2020   Lab Results  Component Value Date   ALT 29 10/15/2020   AST 22 10/15/2020   ALKPHOS 99 10/15/2020   BILITOT 0.3 10/15/2020   Lab Results  Component Value Date   HGBA1C 5.2 07/04/2020   HGBA1C 5.3 07/02/2019   HGBA1C 5.4 08/28/2018   HGBA1C 5.2 05/16/2018   HGBA1C 5.2 02/01/2018   Lab Results  Component Value Date   INSULIN 11.9 10/15/2020   INSULIN 13.8 08/28/2018   INSULIN 10.8 05/16/2018   Lab Results  Component Value Date   TSH 2.540 10/15/2020   Lab Results  Component Value Date   CHOL 266 (H) 10/15/2020   HDL 83 10/15/2020   LDLCALC 174 (H) 10/15/2020   TRIG 61 10/15/2020   CHOLHDL 3.2 10/15/2020   Lab Results  Component Value Date   VD25OH 32.2 10/15/2020   VD25OH 42.2 08/28/2018   VD25OH 24.4 (L) 05/16/2018   Lab Results  Component Value Date   WBC 6.3 10/15/2020   HGB  14.1 10/15/2020   HCT 43.7 10/15/2020   MCV 91 10/15/2020   PLT 352 10/15/2020   No results found for: IRON, TIBC, FERRITIN  Attestation Statements:   Reviewed by clinician on day of visit: allergies, medications, problem list, medical history, surgical history, family history, social history, and previous encounter notes.  I, Jackson Latino, RMA, am acting as Energy manager for [place], FNP.   I have reviewed the above documentation for accuracy and completeness, and I agree with the above. -  Jesse Sans, FNP

## 2021-03-10 ENCOUNTER — Telehealth: Payer: No Typology Code available for payment source | Admitting: Family Medicine

## 2021-03-10 ENCOUNTER — Encounter: Payer: Self-pay | Admitting: Family Medicine

## 2021-03-10 VITALS — Temp 99.5°F

## 2021-03-10 DIAGNOSIS — J069 Acute upper respiratory infection, unspecified: Secondary | ICD-10-CM

## 2021-03-10 DIAGNOSIS — U071 COVID-19: Secondary | ICD-10-CM | POA: Diagnosis not present

## 2021-03-10 NOTE — Progress Notes (Signed)
MyChart Video Visit    Virtual Visit via Video Note   This visit type was conducted due to national recommendations for restrictions regarding the COVID-19 Pandemic (e.g. social distancing) in an effort to limit this patient's exposure and mitigate transmission in our community. This patient is at least at moderate risk for complications without adequate follow up. This format is felt to be most appropriate for this patient at this time. Physical exam was limited by quality of the video and audio technology used for the visit.   Patient location: home Provider location: office  I discussed the limitations of evaluation and management by telemedicine and the availability of in person appointments. The patient expressed understanding and agreed to proceed.  Patient: Andrea Alvarez   DOB: 1965-12-13   55 y.o. Female  MRN: [id number] Visit Date: 03/10/2021  Today's healthcare provider: Dortha Kern, PA-C   No chief complaint on file.  Subjective    HPI  Patient is a 55 year old female who presents via video visit after testing positive for Covid-19.  Patient state she woke up on 03/07/21 with headache and sore throat.  She did a home Covid test and tested negative.  She continued with symptoms and tested on 02/26/21 with home test and it was positive so she confirmed it with a PCR test.   Patient states she had fever 2 days ago but none since.  She has been using Tylenol for her headache and Vicks in her nose.  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   Dr.Madison Clark   REDUCTION  MAMMAPLASTY Bilateral 1999   TUBAL LIGATION  1989   Social History   Tobacco Use   Smoking status: Never   Smokeless tobacco: Never  Vaping Use   Vaping Use: Never used  Substance Use Topics   Alcohol use: Yes    Comment: Occasional alcohol use; once or twice a year   Drug use: No   Family Status  Relation Name Status   Mother  Alive       Melanoma   Father  Deceased   Sister  Alive   MGM  Deceased at age 55       died from an MI   PGM  Deceased   Neg Hx  (Not Specified)   No Known Allergies    Medications: Outpatient Medications Prior to Visit  Medication Sig   Semaglutide,0.25 or 0.5MG /DOS, (OZEMPIC, 0.25 OR 0.5 MG/DOSE,) 2 MG/1.5ML SOPN Inject 0.5 mg into the skin once a week.   Vitamin D, Ergocalciferol, (DRISDOL) 1.25 MG (50000 UNIT) CAPS capsule Take 1 capsule (50,000 Units total) by mouth every 7 (seven) days.   No facility-administered medications prior to visit.    Review of Systems  Constitutional:  Positive for chills, diaphoresis, fatigue and fever.  HENT:  Positive for congestion, postnasal drip, rhinorrhea, sinus pressure, sinus pain, sneezing, sore throat, trouble swallowing and voice change. Negative for ear discharge, ear pain, facial swelling, hearing loss, nosebleeds and  tinnitus.   Respiratory:  Positive for cough (productive of clear). Negative for chest tightness, shortness of breath and wheezing.   Cardiovascular:  Negative for leg swelling.  Gastrointestinal:  Positive for diarrhea. Negative for abdominal pain.  Musculoskeletal:  Negative for myalgias.  Neurological:  Positive for headaches. Negative for dizziness and light-headedness.     Objective    LMP 11/16/2016 (Approximate)   Today's Vitals   03/10/21 1225  Temp: 99.5 F (37.5 C)   There is no height or weight on file to calculate BMI.    Physical Exam: WDWN female in no apparent distress.  Head: Normocephalic, atraumatic. Neck: Supple, NROM Respiratory: No apparent  distress Psych: Normal mood and affect ENT: Rhinorrhea with reddened nares.    Assessment & Plan     1. Upper respiratory tract infection due to COVID-19 virus Developed sore throat, headache, chills, cough, sneeze and PND with positive COVID home test 03-08-21 and PCR COVID test positive on 03-09-21. No dyspnea or loss of taste. No significant chronic disease and not over 55. States she had COVID infection last year and has had 3 vaccinations last year. May use Mucinex-DM, Zyrtec and Tylenol prn. Increase in fluid intake and isolate at home for 5-7 days (normally works from home). If symptoms or fever worsens over the next 2-3 days, should call to report progress.   No follow-ups on file.     I discussed the assessment and treatment plan with the patient. The patient was provided an opportunity to ask questions and all were answered. The patient agreed with the plan and demonstrated an understanding of the instructions.   The patient was advised to call back or seek an in-person evaluation if the symptoms worsen or if the condition fails to improve as anticipated.  I provided 20 minutes of non-face-to-face time during this encounter.  I, Berea Majkowski, PA-C, have reviewed all documentation for this visit. The documentation on 03/10/21 for the exam, diagnosis, procedures, and orders are all accurate and complete.   Dortha Kern, PA-C [employer] [phone] (phone) [phone] (fax)  [facility]

## 2021-03-31 ENCOUNTER — Encounter (INDEPENDENT_AMBULATORY_CARE_PROVIDER_SITE_OTHER): Payer: Self-pay | Admitting: Family Medicine

## 2021-03-31 ENCOUNTER — Ambulatory Visit (INDEPENDENT_AMBULATORY_CARE_PROVIDER_SITE_OTHER): Payer: No Typology Code available for payment source | Admitting: Family Medicine

## 2021-03-31 ENCOUNTER — Other Ambulatory Visit: Payer: Self-pay

## 2021-03-31 VITALS — BP 107/72 | HR 80 | Temp 98.0°F | Ht 62.0 in | Wt 185.0 lb

## 2021-03-31 DIAGNOSIS — E559 Vitamin D deficiency, unspecified: Secondary | ICD-10-CM | POA: Diagnosis not present

## 2021-03-31 DIAGNOSIS — E88819 Insulin resistance, unspecified: Secondary | ICD-10-CM

## 2021-03-31 DIAGNOSIS — Z6837 Body mass index (BMI) 37.0-37.9, adult: Secondary | ICD-10-CM

## 2021-03-31 DIAGNOSIS — E7849 Other hyperlipidemia: Secondary | ICD-10-CM

## 2021-03-31 DIAGNOSIS — E8881 Metabolic syndrome: Secondary | ICD-10-CM | POA: Diagnosis not present

## 2021-03-31 MED ORDER — VITAMIN D (ERGOCALCIFEROL) 1.25 MG (50000 UNIT) PO CAPS: 50000.0000 [IU] | ORAL_CAPSULE | ORAL | 0 refills | Status: DC

## 2021-03-31 NOTE — Progress Notes (Signed)
Chief Complaint:   OBESITY Andrea Alvarez is here to discuss her progress with her obesity treatment plan along with follow-up of her obesity related diagnoses. Andrea Alvarez is on the Category 2 Plan and keeping a food journal and adhering to recommended goals of 400-500 calories and 35 grams of protein at dinner and states she is following her eating plan approximately 75% of the time. Andrea Alvarez states she is doing elliptical and hula hoop for 30-45 minutes 3 times per week.  Today's visit was #: 10 Starting weight: 197 lbs Starting date: 10/15/2020 Today's weight: 185 lbs Today's date: 03/31/2021 Total lbs lost to date: 12 lbs Total lbs lost since last in-office visit: 3 lbs  Interim History: Andrea Alvarez feels she has been more mindful about eating. Her protein intake is good. She has lost 31 lbs overall. Her top weight was 216 lbs.  Subjective:   1. Other hyperlipidemia Andrea Alvarez's last LDL was very hight at 174. Her triglycerides and HDL were within normal limits. She is on statin.  Lab Results  Component Value Date   CHOL 266 (H) 10/15/2020   HDL 83 10/15/2020   LDLCALC 174 (H) 10/15/2020   TRIG 61 10/15/2020   CHOLHDL 3.2 10/15/2020   Lab Results  Component Value Date   ALT 29 10/15/2020   AST 22 10/15/2020   ALKPHOS 99 10/15/2020   BILITOT 0.3 10/15/2020   The 10-year ASCVD risk score (Arnett DK, et al., 2019) is: 1.3%   Values used to calculate the score:     Age: 55 years     Sex: Female     Is Non-Hispanic African American: No     Diabetic: No     Tobacco smoker: No     Systolic Blood Pressure: 107 mmHg     Is BP treated: No     HDL Cholesterol: 83 mg/dL     Total Cholesterol: 266 mg/dL   2. Vitamin D insufficiency Andrea Alvarez's last Vitamin D level was low at 32.2. She is on weekly prescription Vitamin D.  Lab Results  Component Value Date   VD25OH 32.2 10/15/2020   VD25OH 42.2 08/28/2018   VD25OH 24.4 (L) 05/16/2018    3. Insulin resistance Andrea Alvarez's last A1C was within  normal limits at 5.2. She is on Ozempic and tolerating it well. She denies constipation and nausea.   Lab Results  Component Value Date   INSULIN 11.9 10/15/2020   INSULIN 13.8 08/28/2018   INSULIN 10.8 05/16/2018   Lab Results  Component Value Date   HGBA1C 5.2 07/04/2020    Assessment/Plan:   1. Other hyperlipidemia . We will check FLP today.   - Lipid Panel With LDL/HDL Ratio  2. Vitamin D insufficiency We will check Vitamin D today. We will refill prescription Vitamin D 50,000 IU every week for 1 month with no refills and Andrea Alvarez will follow-up for routine testing of Vitamin D, at least 2-3 times per year to avoid over-replacement.  - VITAMIN D 25 Hydroxy (Vit-D Deficiency, Fractures) - Vitamin D, Ergocalciferol, (DRISDOL) 1.25 MG (50000 UNIT) CAPS capsule; Take 1 capsule (50,000 Units total) by mouth every 7 (seven) days.  Dispense: 12 capsule; Refill: 0  3. Insulin resistance We will check A1C and fasting insulin today. Andrea Alvarez will continue Ozempic at 0.5 mg weekly. Andrea Alvarez agreed to follow-up with [place] as directed to closely monitor her progress.  - Comprehensive metabolic panel - Hemoglobin A1c - Insulin, random  4. Obesity with current BMI 33.83 Andrea Alvarez is currently in  the action stage of change. As such, her goal is to continue with weight loss efforts. She has agreed to the Category 2 Plan and keeping a food journal and adhering to recommended goals of 400-500 calories and 35 grams of protein at supper.  Exercise goals:  As is.  Behavioral modification strategies: increasing lean protein intake and decreasing simple carbohydrates.  Andrea Alvarez has agreed to follow-up with our clinic in 3-4 weeks. Andrea Alvarez was informed we would discuss her lab results at her next visit unless there is a critical issue that needs to be addressed sooner. Andrea Alvarez agreed to keep her next visit at the agreed upon time to discuss these results.  Objective:   Blood pressure 107/72, pulse 80,  temperature 98 F (36.7 C), height 5\' 2"  (1.575 m), weight 185 lb (83.9 kg), last menstrual period 11/16/2016, SpO2 97 %. Body mass index is 33.84 kg/m.  General: Cooperative, alert, well developed, in no acute distress. HEENT: Conjunctivae and lids unremarkable. Cardiovascular: Regular rhythm.  Lungs: Normal work of breathing. Neurologic: No focal deficits.   Lab Results  Component Value Date   CREATININE 0.77 10/15/2020   BUN 16 10/15/2020   NA 142 10/15/2020   K 4.7 10/15/2020   CL 101 10/15/2020   CO2 22 10/15/2020   Lab Results  Component Value Date   ALT 29 10/15/2020   AST 22 10/15/2020   ALKPHOS 99 10/15/2020   BILITOT 0.3 10/15/2020   Lab Results  Component Value Date   HGBA1C 5.2 07/04/2020   HGBA1C 5.3 07/02/2019   HGBA1C 5.4 08/28/2018   HGBA1C 5.2 05/16/2018   HGBA1C 5.2 02/01/2018   Lab Results  Component Value Date   INSULIN 11.9 10/15/2020   INSULIN 13.8 08/28/2018   INSULIN 10.8 05/16/2018   Lab Results  Component Value Date   TSH 2.540 10/15/2020   Lab Results  Component Value Date   CHOL 266 (H) 10/15/2020   HDL 83 10/15/2020   LDLCALC 174 (H) 10/15/2020   TRIG 61 10/15/2020   CHOLHDL 3.2 10/15/2020   Lab Results  Component Value Date   VD25OH 32.2 10/15/2020   VD25OH 42.2 08/28/2018   VD25OH 24.4 (L) 05/16/2018   Lab Results  Component Value Date   WBC 6.3 10/15/2020   HGB 14.1 10/15/2020   HCT 43.7 10/15/2020   MCV 91 10/15/2020   PLT 352 10/15/2020   No results found for: IRON, TIBC, FERRITIN  Attestation Statements:   Reviewed by clinician on day of visit: allergies, medications, problem list, medical history, surgical history, family history, social history, and previous encounter notes.  I, 10/17/2020, RMA, am acting as Jackson Latino for Energy manager, FNP.   I have reviewed the above documentation for accuracy and completeness, and I agree with the above. -  [place], FNP

## 2021-04-01 ENCOUNTER — Encounter (INDEPENDENT_AMBULATORY_CARE_PROVIDER_SITE_OTHER): Payer: Self-pay | Admitting: Family Medicine

## 2021-04-01 LAB — COMPREHENSIVE METABOLIC PANEL
ALT: 17 IU/L (ref 0–32)
AST: 12 IU/L (ref 0–40)
Albumin/Globulin Ratio: 1.4 (ref 1.2–2.2)
Albumin: 4.5 g/dL (ref 3.8–4.9)
Alkaline Phosphatase: 84 IU/L (ref 44–121)
BUN/Creatinine Ratio: 24 — ABNORMAL HIGH (ref 9–23)
BUN: 18 mg/dL (ref 6–24)
Bilirubin Total: 0.4 mg/dL (ref 0.0–1.2)
CO2: 23 mmol/L (ref 20–29)
Calcium: 9.7 mg/dL (ref 8.7–10.2)
Chloride: 102 mmol/L (ref 96–106)
Creatinine, Ser: 0.75 mg/dL (ref 0.57–1.00)
Globulin, Total: 3.2 g/dL (ref 1.5–4.5)
Glucose: 84 mg/dL (ref 65–99)
Potassium: 4.9 mmol/L (ref 3.5–5.2)
Sodium: 142 mmol/L (ref 134–144)
Total Protein: 7.7 g/dL (ref 6.0–8.5)
eGFR: 94 mL/min/{1.73_m2} (ref >59)

## 2021-04-01 LAB — HEMOGLOBIN A1C
Est. average glucose Bld gHb Est-mCnc: 105 mg/dL
Hgb A1c MFr Bld: 5.3 % (ref 4.8–5.6)

## 2021-04-01 LAB — LIPID PANEL WITH LDL/HDL RATIO
Cholesterol, Total: 239 mg/dL — ABNORMAL HIGH (ref 100–199)
HDL: 68 mg/dL (ref >39)
LDL Chol Calc (NIH): 158 mg/dL — ABNORMAL HIGH (ref 0–99)
LDL/HDL Ratio: 2.3 ratio (ref 0.0–3.2)
Triglycerides: 75 mg/dL (ref 0–149)
VLDL Cholesterol Cal: 13 mg/dL (ref 5–40)

## 2021-04-01 LAB — INSULIN, RANDOM: INSULIN: 10.8 u[IU]/mL (ref 2.6–24.9)

## 2021-04-01 LAB — VITAMIN D 25 HYDROXY (VIT D DEFICIENCY, FRACTURES): Vit D, 25-Hydroxy: 51.6 ng/mL (ref 30.0–100.0)

## 2021-04-13 ENCOUNTER — Telehealth (INDEPENDENT_AMBULATORY_CARE_PROVIDER_SITE_OTHER): Payer: Self-pay

## 2021-04-21 ENCOUNTER — Ambulatory Visit (INDEPENDENT_AMBULATORY_CARE_PROVIDER_SITE_OTHER): Payer: No Typology Code available for payment source | Admitting: Family Medicine

## 2021-04-21 ENCOUNTER — Other Ambulatory Visit: Payer: Self-pay

## 2021-04-21 ENCOUNTER — Encounter (INDEPENDENT_AMBULATORY_CARE_PROVIDER_SITE_OTHER): Payer: Self-pay | Admitting: Family Medicine

## 2021-04-21 VITALS — BP 125/76 | HR 75 | Temp 98.0°F | Ht 62.0 in | Wt 184.0 lb

## 2021-04-21 DIAGNOSIS — Z6837 Body mass index (BMI) 37.0-37.9, adult: Secondary | ICD-10-CM | POA: Diagnosis not present

## 2021-04-21 DIAGNOSIS — E559 Vitamin D deficiency, unspecified: Secondary | ICD-10-CM

## 2021-04-21 DIAGNOSIS — Z9189 Other specified personal risk factors, not elsewhere classified: Secondary | ICD-10-CM

## 2021-04-21 DIAGNOSIS — E8881 Metabolic syndrome: Secondary | ICD-10-CM | POA: Diagnosis not present

## 2021-04-21 MED ORDER — TIRZEPATIDE 5 MG/0.5ML ~~LOC~~ SOAJ: 5.0000 mg | SUBCUTANEOUS | 0 refills | Status: DC

## 2021-04-21 NOTE — Progress Notes (Signed)
Chief Complaint:   OBESITY Andrea Alvarez is here to discuss her progress with her obesity treatment plan along with follow-up of her obesity related diagnoses. Andrea Alvarez is on the Category 2 Plan and keeping a food journal and adhering to recommended goals of 400-500  calories and 35 grams of protein and states she is following her eating plan approximately 75% of the time. Andrea Alvarez states she is hula hoop, weights and walking for 15-30 minutes 2-3 times per week.  Today's visit was #: 11 Starting weight: 197 lbs Starting date: 10/15/2020 Today's weight: 184 lbs Today's date: 04/21/2021 Total lbs lost to date: 13 lbs Total lbs lost since last in-office visit: 0  Interim History: Andrea Alvarez went to the beach for several days but still lost weight today. Her goal is to lose 8 more pounds by the end of the year.   Subjective:   1. Insulin resistance Andrea Alvarez is currently on Ozempic. Her appetite is well controlled.  Lab Results  Component Value Date   INSULIN 10.8 03/31/2021   INSULIN 11.9 10/15/2020   INSULIN 13.8 08/28/2018   INSULIN 10.8 05/16/2018   Lab Results  Component Value Date   HGBA1C 5.3 03/31/2021    2. Vitamin D insufficiency Andrea Alvarez's Vitamin D is at goal (51.6). She is on weekly prescription Vitamin D.   Lab Results  Component Value Date   VD25OH 51.6 03/31/2021   VD25OH 32.2 10/15/2020   VD25OH 42.2 08/28/2018    3. At risk for side effect of medication Andrea Alvarez is at risk for side effect of medication due to start of Mounjaro.  Assessment/Plan:   1. Insulin resistance Andrea Alvarez will discontinue Ozempic. Andrea Alvarez agrees to start [employer] 5 mg.  - tirzepatide [facility]) 5 MG/0.5ML Pen; Inject 5 mg into the skin once a week.  Dispense: 6 mL; Refill: 0  2. Vitamin D insufficiency  We will refill prescription Vitamin D 50,000 IU every week and Andrea Alvarez will follow-up for routine testing of Vitamin D, at least 2-3 times per year to avoid over-replacement.  3. At risk for side  effect of medication Andrea Alvarez was given approximately 15 minutes of drug side effect counseling today.  We discussed side effect possibility and risk versus benefits. Andrea Alvarez agreed to the medication and will contact this office if these side effects are intolerable.  Repetitive spaced learning was employed today to elicit superior memory formation and behavioral change.   4. Obesity with current BMI 33.65 Andrea Alvarez is currently in the action stage of change. As such, her goal is to continue with weight loss efforts. She has agreed to the Category 2 Plan and keeping a food journal and adhering to recommended goals of 400-500 calories and 35 grams of protein.   Exercise goals:  As is.  Behavioral modification strategies: increasing lean protein intake and decreasing simple carbohydrates.  Andrea Alvarez has agreed to follow-up with our clinic in 3-4 weeks.  Objective:   Blood pressure 125/76, pulse 75, temperature 98 F (36.7 C), height 5\' 2"  (1.575 m), weight 184 lb (83.5 kg), last menstrual period 11/16/2016, SpO2 97 %. Body mass index is 33.65 kg/m.  General: Cooperative, alert, well developed, in no acute distress. HEENT: Conjunctivae and lids unremarkable. Cardiovascular: Regular rhythm.  Lungs: Normal work of breathing. Neurologic: No focal deficits.   Lab Results  Component Value Date   CREATININE 0.75 03/31/2021   BUN 18 03/31/2021   NA 142 03/31/2021   K 4.9 03/31/2021   CL 102 03/31/2021   CO2 23  03/31/2021   Lab Results  Component Value Date   ALT 17 03/31/2021   AST 12 03/31/2021   ALKPHOS 84 03/31/2021   BILITOT 0.4 03/31/2021   Lab Results  Component Value Date   HGBA1C 5.3 03/31/2021   HGBA1C 5.2 07/04/2020   HGBA1C 5.3 07/02/2019   HGBA1C 5.4 08/28/2018   HGBA1C 5.2 05/16/2018   Lab Results  Component Value Date   INSULIN 10.8 03/31/2021   INSULIN 11.9 10/15/2020   INSULIN 13.8 08/28/2018   INSULIN 10.8 05/16/2018   Lab Results  Component Value Date   TSH  2.540 10/15/2020   Lab Results  Component Value Date   CHOL 239 (H) 03/31/2021   HDL 68 03/31/2021   LDLCALC 158 (H) 03/31/2021   TRIG 75 03/31/2021   CHOLHDL 3.2 10/15/2020   Lab Results  Component Value Date   VD25OH 51.6 03/31/2021   VD25OH 32.2 10/15/2020   VD25OH 42.2 08/28/2018   Lab Results  Component Value Date   WBC 6.3 10/15/2020   HGB 14.1 10/15/2020   HCT 43.7 10/15/2020   MCV 91 10/15/2020   PLT 352 10/15/2020   No results found for: IRON, TIBC, FERRITIN  Attestation Statements:   Reviewed by clinician on day of visit: allergies, medications, problem list, medical history, surgical history, family history, social history, and previous encounter notes.  I, Jackson Latino, RMA, am acting as Energy manager for [place], FNP.   I have reviewed the above documentation for accuracy and completeness, and I agree with the above. -  Jesse Sans, FNP

## 2021-05-04 ENCOUNTER — Encounter (INDEPENDENT_AMBULATORY_CARE_PROVIDER_SITE_OTHER): Payer: Self-pay

## 2021-05-13 ENCOUNTER — Ambulatory Visit (INDEPENDENT_AMBULATORY_CARE_PROVIDER_SITE_OTHER): Payer: No Typology Code available for payment source | Admitting: Family Medicine

## 2021-05-13 ENCOUNTER — Encounter (INDEPENDENT_AMBULATORY_CARE_PROVIDER_SITE_OTHER): Payer: Self-pay | Admitting: Family Medicine

## 2021-05-13 ENCOUNTER — Other Ambulatory Visit: Payer: Self-pay

## 2021-05-13 VITALS — BP 102/70 | HR 79 | Temp 98.1°F | Ht 62.0 in | Wt 183.0 lb

## 2021-05-13 DIAGNOSIS — E8881 Metabolic syndrome: Secondary | ICD-10-CM

## 2021-05-13 DIAGNOSIS — E7849 Other hyperlipidemia: Secondary | ICD-10-CM | POA: Diagnosis not present

## 2021-05-13 DIAGNOSIS — Z6837 Body mass index (BMI) 37.0-37.9, adult: Secondary | ICD-10-CM | POA: Diagnosis not present

## 2021-05-13 MED ORDER — TIRZEPATIDE 5 MG/0.5ML ~~LOC~~ SOAJ: 5.0000 mg | SUBCUTANEOUS | 0 refills | Status: DC

## 2021-05-14 NOTE — Progress Notes (Signed)
Chief Complaint:   OBESITY Andrea Alvarez is here to discuss her progress with her obesity treatment plan along with follow-up of her obesity related diagnoses. Andrea Alvarez is on the Category 2 Plan and keeping a food journal and adhering to recommended goals of 400-500 calories and 35 grams of protein and states she is following her eating plan approximately 75-80% of the time. Andrea Alvarez states she is not exercising regularly at this time.  Today's visit was #: 12 Starting weight: 197 lbs Starting date: 10/15/2020 Today's weight: 183 lbs Today's date: 05/13/2021 Total lbs lost to date: 14 lbs Total lbs lost since last in-office visit: 1 lb  Interim History: [name] just got back from a cruise from the [place].  She still lost 1 pound and she is happy with that.  She is able to get in the prescribed protein.  She notes she does not think about food, eating, or snacking as much as she used to now that she is taking Mounjaro.  Subjective:   1. Insulin resistance Tolerating 5 mg of Mounjaro well.  She notes good appetite suppression.  Lab Results  Component Value Date   INSULIN 10.8 03/31/2021   INSULIN 11.9 10/15/2020   INSULIN 13.8 08/28/2018   INSULIN 10.8 05/16/2018   Lab Results  Component Value Date   HGBA1C 5.3 03/31/2021   2. Other hyperlipidemia LDL elevated at 158.  HDL and triglycerides 75 (10/19/4740). She is not on a statin. The 10-year ASCVD risk score (Arnett DK, et al., 2019) is: 1.3%   Values used to calculate the score:     Age: 54 years     Sex: Female     Is Non-Hispanic African American: No     Diabetic: No     Tobacco smoker: No     Systolic Blood Pressure: 102 mmHg     Is BP treated: No     HDL Cholesterol: 68 mg/dL     Total Cholesterol: 239 mg/dL  Lab Results  Component Value Date   ALT 17 03/31/2021   AST 12 03/31/2021   ALKPHOS 84 03/31/2021   BILITOT 0.4 03/31/2021   Lab Results  Component Value Date   CHOL 239 (H) 03/31/2021   HDL 68 03/31/2021    LDLCALC 158 (H) 03/31/2021   TRIG 75 03/31/2021   CHOLHDL 3.2 10/15/2020   Assessment/Plan:   1. Insulin resistance Refill Mounjaro 5 mg weekly, as per below.  - Refill tirzepatide (MOUNJARO) 5 MG/0.5ML Pen; Inject 5 mg into the skin once a week.  Dispense: 6 mL; Refill: 0  2. Other hyperlipidemia Continue meal plan.  3. Obesity with current BMI 33.65  [name] is currently in the action stage of change. As such, her goal is to continue with weight loss efforts. She has agreed to the Category 2 Plan and keeping a food journal and adhering to recommended goals of 400-500 calories and 35 grams of protein.   Exercise goals: No exercise has been prescribed at this time.  Behavioral modification strategies: increasing lean protein intake and decreasing simple carbohydrates.  Arlissa has agreed to follow-up with our clinic in 3-4 weeks. Objective:   Blood pressure 102/70, pulse 79, temperature 98.1 F (36.7 C), height 5\' 2"  (1.575 m), weight 183 lb (83 kg), last menstrual period 11/16/2016, SpO2 97 %. Body mass index is 33.47 kg/m.  General: Cooperative, alert, well developed, in no acute distress. HEENT: Conjunctivae and lids unremarkable. Cardiovascular: Regular rhythm.  Lungs: Normal work of breathing. Neurologic: No  focal deficits.   Lab Results  Component Value Date   CREATININE 0.75 03/31/2021   BUN 18 03/31/2021   NA 142 03/31/2021   K 4.9 03/31/2021   CL 102 03/31/2021   CO2 23 03/31/2021   Lab Results  Component Value Date   ALT 17 03/31/2021   AST 12 03/31/2021   ALKPHOS 84 03/31/2021   BILITOT 0.4 03/31/2021   Lab Results  Component Value Date   HGBA1C 5.3 03/31/2021   HGBA1C 5.2 07/04/2020   HGBA1C 5.3 07/02/2019   HGBA1C 5.4 08/28/2018   HGBA1C 5.2 05/16/2018   Lab Results  Component Value Date   INSULIN 10.8 03/31/2021   INSULIN 11.9 10/15/2020   INSULIN 13.8 08/28/2018   INSULIN 10.8 05/16/2018   Lab Results  Component Value Date   TSH  2.540 10/15/2020   Lab Results  Component Value Date   CHOL 239 (H) 03/31/2021   HDL 68 03/31/2021   LDLCALC 158 (H) 03/31/2021   TRIG 75 03/31/2021   CHOLHDL 3.2 10/15/2020   Lab Results  Component Value Date   VD25OH 51.6 03/31/2021   VD25OH 32.2 10/15/2020   VD25OH 42.2 08/28/2018   Lab Results  Component Value Date   WBC 6.3 10/15/2020   HGB 14.1 10/15/2020   HCT 43.7 10/15/2020   MCV 91 10/15/2020   PLT 352 10/15/2020   Attestation Statements:   Reviewed by clinician on day of visit: allergies, medications, problem list, medical history, surgical history, family history, social history, and previous encounter notes.  I, Insurance claims handler, CMA, am acting as Energy manager for [place], FNP.  I have reviewed the above documentation for accuracy and completeness, and I agree with the above. -  Jesse Sans, FNP

## 2021-05-15 ENCOUNTER — Encounter (INDEPENDENT_AMBULATORY_CARE_PROVIDER_SITE_OTHER): Payer: Self-pay | Admitting: Family Medicine

## 2021-06-03 ENCOUNTER — Encounter (INDEPENDENT_AMBULATORY_CARE_PROVIDER_SITE_OTHER): Payer: Self-pay | Admitting: Bariatrics

## 2021-06-03 ENCOUNTER — Other Ambulatory Visit: Payer: Self-pay

## 2021-06-03 ENCOUNTER — Ambulatory Visit (INDEPENDENT_AMBULATORY_CARE_PROVIDER_SITE_OTHER): Payer: No Typology Code available for payment source | Admitting: Family Medicine

## 2021-06-03 ENCOUNTER — Ambulatory Visit (INDEPENDENT_AMBULATORY_CARE_PROVIDER_SITE_OTHER): Payer: No Typology Code available for payment source | Admitting: Bariatrics

## 2021-06-03 VITALS — BP 113/76 | HR 96 | Temp 98.5°F | Ht 62.0 in | Wt 178.0 lb

## 2021-06-03 DIAGNOSIS — Z6837 Body mass index (BMI) 37.0-37.9, adult: Secondary | ICD-10-CM

## 2021-06-03 DIAGNOSIS — E8881 Metabolic syndrome: Secondary | ICD-10-CM

## 2021-06-03 DIAGNOSIS — E559 Vitamin D deficiency, unspecified: Secondary | ICD-10-CM | POA: Diagnosis not present

## 2021-06-03 MED ORDER — TIRZEPATIDE 7.5 MG/0.5ML ~~LOC~~ SOAJ: 7.5000 mg | SUBCUTANEOUS | 0 refills | Status: DC

## 2021-06-03 MED ORDER — VITAMIN D (ERGOCALCIFEROL) 1.25 MG (50000 UNIT) PO CAPS: 50000.0000 [IU] | ORAL_CAPSULE | ORAL | 0 refills | Status: DC

## 2021-06-04 ENCOUNTER — Encounter (INDEPENDENT_AMBULATORY_CARE_PROVIDER_SITE_OTHER): Payer: Self-pay | Admitting: Bariatrics

## 2021-06-04 NOTE — Progress Notes (Signed)
Chief Complaint:   OBESITY Andrea Alvarez is here to discuss her progress with her obesity treatment plan along with follow-up of her obesity related diagnoses. Andrea Alvarez is on the Category 2 Plan and states she is following her eating plan approximately 75% of the time. Andrea Alvarez states she is doing 0 minutes 0 times per week.  Today's visit was #: 13 Starting weight: 197 lbs Starting date: 10/15/2020 Today's weight: 178 lbs Today's date: 06/04/2021 Total lbs lost to date: 19 lbs Total lbs lost since last in-office visit: 5 lbs  Interim History: Andrea Alvarez is down an additional 5 lbs since her last visit.  Subjective:   1. Insulin resistance Andrea Alvarez states Mounjaro 7.5 mg helps with her appetite suppression.  2. Vitamin D insufficiency Andrea Alvarez is taking Vitamin D as directed.  Assessment/Plan:   1. Insulin resistance Andrea Alvarez will continue to work on weight loss, exercise, and decreasing simple carbohydrates to help decrease the risk of diabetes. We will refill Mounjaro 7.5 mg for 1 month with no refills. Andrea Alvarez agreed to follow-up with [place] as directed to closely monitor her progress.  - tirzepatide (MOUNJARO) 7.5 MG/0.5ML Pen; Inject 7.5 mg into the skin once a week.  Dispense: 2 mL; Refill: 0  2. Vitamin D insufficiency Low Vitamin D level contributes to fatigue and are associated with obesity, breast, and colon cancer. We will refill prescription Vitamin D 50,000 IU every week for 1 month with no refills and Andrea Alvarez will follow-up for routine testing of Vitamin D, at least 2-3 times per year to avoid over-replacement.  - Vitamin D, Ergocalciferol, (DRISDOL) 1.25 MG (50000 UNIT) CAPS capsule; Take 1 capsule (50,000 Units total) by mouth every 7 (seven) days.  Dispense: 12 capsule; Refill: 0  3. Obesity with current BMI 32.7 Andrea Alvarez is currently in the action stage of change. As such, her goal is to continue with weight loss efforts. She has agreed to the Category 2 Plan.   Andrea Alvarez will continue  meal planning and she will continue intentional eating.  Exercise goals: No exercise has been prescribed at this time.  Behavioral modification strategies: increasing lean protein intake, decreasing simple carbohydrates, increasing vegetables, increasing water intake, decreasing eating out, no skipping meals, meal planning and cooking strategies, keeping healthy foods in the home, and planning for success.  Andrea Alvarez has agreed to follow-up with our clinic in 3 weeks with Adah Salvage, FNP. She was informed of the importance of frequent follow-up visits to maximize her success with intensive lifestyle modifications for her multiple health conditions.   Objective:   Blood pressure 113/76, pulse 96, temperature 98.5 F (36.9 C), height 5\' 2"  (1.575 m), weight 178 lb (80.7 kg), last menstrual period 11/16/2016, SpO2 97 %. Body mass index is 32.56 kg/m.  General: Cooperative, alert, well developed, in no acute distress. HEENT: Conjunctivae and lids unremarkable. Cardiovascular: Regular rhythm.  Lungs: Normal work of breathing. Neurologic: No focal deficits.   Lab Results  Component Value Date   CREATININE 0.75 03/31/2021   BUN 18 03/31/2021   NA 142 03/31/2021   K 4.9 03/31/2021   CL 102 03/31/2021   CO2 23 03/31/2021   Lab Results  Component Value Date   ALT 17 03/31/2021   AST 12 03/31/2021   ALKPHOS 84 03/31/2021   BILITOT 0.4 03/31/2021   Lab Results  Component Value Date   HGBA1C 5.3 03/31/2021   HGBA1C 5.2 07/04/2020   HGBA1C 5.3 07/02/2019   HGBA1C 5.4 08/28/2018   HGBA1C 5.2 05/16/2018  Lab Results  Component Value Date   INSULIN 10.8 03/31/2021   INSULIN 11.9 10/15/2020   INSULIN 13.8 08/28/2018   INSULIN 10.8 05/16/2018   Lab Results  Component Value Date   TSH 2.540 10/15/2020   Lab Results  Component Value Date   CHOL 239 (H) 03/31/2021   HDL 68 03/31/2021   LDLCALC 158 (H) 03/31/2021   TRIG 75 03/31/2021   CHOLHDL 3.2 10/15/2020   Lab Results   Component Value Date   VD25OH 51.6 03/31/2021   VD25OH 32.2 10/15/2020   VD25OH 42.2 08/28/2018   Lab Results  Component Value Date   WBC 6.3 10/15/2020   HGB 14.1 10/15/2020   HCT 43.7 10/15/2020   MCV 91 10/15/2020   PLT 352 10/15/2020   No results found for: IRON, TIBC, FERRITIN  Attestation Statements:   Reviewed by clinician on day of visit: allergies, medications, problem list, medical history, surgical history, family history, social history, and previous encounter notes.  I, Jackson Latino, RMA, am acting as Energy manager for [employer], DO.   I have reviewed the above documentation for accuracy and completeness, and I agree with the above. Corinna Capra, DO

## 2021-06-09 NOTE — Telephone Encounter (Signed)
error 

## 2021-06-30 ENCOUNTER — Other Ambulatory Visit: Payer: Self-pay

## 2021-06-30 ENCOUNTER — Other Ambulatory Visit (HOSPITAL_COMMUNITY): Payer: Self-pay

## 2021-06-30 ENCOUNTER — Ambulatory Visit (INDEPENDENT_AMBULATORY_CARE_PROVIDER_SITE_OTHER): Payer: No Typology Code available for payment source | Admitting: Family Medicine

## 2021-06-30 ENCOUNTER — Encounter (INDEPENDENT_AMBULATORY_CARE_PROVIDER_SITE_OTHER): Payer: Self-pay | Admitting: Family Medicine

## 2021-06-30 ENCOUNTER — Telehealth (INDEPENDENT_AMBULATORY_CARE_PROVIDER_SITE_OTHER): Payer: Self-pay

## 2021-06-30 ENCOUNTER — Other Ambulatory Visit (INDEPENDENT_AMBULATORY_CARE_PROVIDER_SITE_OTHER): Payer: Self-pay | Admitting: Family Medicine

## 2021-06-30 VITALS — BP 117/83 | HR 82 | Temp 98.0°F | Ht 62.0 in | Wt 170.0 lb

## 2021-06-30 DIAGNOSIS — Z6837 Body mass index (BMI) 37.0-37.9, adult: Secondary | ICD-10-CM

## 2021-06-30 DIAGNOSIS — E8881 Metabolic syndrome: Secondary | ICD-10-CM

## 2021-06-30 DIAGNOSIS — E559 Vitamin D deficiency, unspecified: Secondary | ICD-10-CM | POA: Diagnosis not present

## 2021-06-30 MED ORDER — TIRZEPATIDE 7.5 MG/0.5ML ~~LOC~~ SOAJ
7.5000 mg | SUBCUTANEOUS | 0 refills | Status: DC
  Filled 2021-06-30: qty 2, 28d supply, fill #0

## 2021-06-30 MED ORDER — TIRZEPATIDE 10 MG/0.5ML ~~LOC~~ SOAJ
10.0000 mg | SUBCUTANEOUS | 0 refills | Status: DC
  Filled 2021-06-30: qty 6, 84d supply, fill #0
  Filled 2021-07-01: qty 2, 28d supply, fill #0

## 2021-06-30 NOTE — Telephone Encounter (Signed)
See my chart message

## 2021-07-01 ENCOUNTER — Other Ambulatory Visit (HOSPITAL_COMMUNITY): Payer: Self-pay

## 2021-07-01 ENCOUNTER — Encounter (INDEPENDENT_AMBULATORY_CARE_PROVIDER_SITE_OTHER): Payer: Self-pay | Admitting: Family Medicine

## 2021-07-01 NOTE — Progress Notes (Signed)
Chief Complaint:   OBESITY Andrea Alvarez is here to discuss her progress with her obesity treatment plan along with follow-up of her obesity related diagnoses. Andrea Alvarez is on the Category 2 Plan and states she is following her eating plan approximately 50% of the time. Andrea Alvarez states she is doing hula hoop for 20 minutes 2 times per week.  Today's visit was #: 14 Starting weight: 197 lbs Starting date: 10/15/2020 Today's weight: 170 lbs Today's date: 06/30/2021 Total lbs lost to date: 27 lbs Total lbs lost since last in-office visit: 8 lbs  Interim History: Andrea Alvarez tends to deviate from plan at breakfast due to boredom. She is on plan at lunch and dinner. She is planning a trip to [facility] at Christmas for 3 days.  She drinks unsweetened tea with [place] throughout the day.  Denies intake of sugar sweetened beverages.   Subjective:   1. Insulin resistance Andrea Alvarez notes reflux with Mounjaro. She is on Nexium daily. She denies diarrhea or nausea. Mounjaro 7.5 mg is currently unavailable.   Lab Results  Component Value Date   INSULIN 10.8 03/31/2021   INSULIN 11.9 10/15/2020   INSULIN 13.8 08/28/2018   INSULIN 10.8 05/16/2018   Lab Results  Component Value Date   HGBA1C 5.3 03/31/2021    2. Vitamin D insufficiency Andrea Alvarez's Vitamin D is at goal. She is on weekly prescription Vitamin D.  Lab Results  Component Value Date   VD25OH 51.6 03/31/2021   VD25OH 32.2 10/15/2020   VD25OH 42.2 08/28/2018    Assessment/Plan:   1. Insulin resistance Nozomi agrees to increase dose of Mounjaro from 7.5 mg to 10.0 mg weekly.  - tirzepatide (MOUNJARO) 10 MG/0.5ML Pen; Inject 10 mg into the skin once a week.  Dispense: 6 mL; Refill: 0  2. Vitamin D insufficiency  Andrea Alvarez agrees to continue to take prescription Vitamin D 50,000 IU every week and she will follow-up for routine testing of Vitamin D, at least 2-3 times per year to avoid over-replacement.   3. Obesity with current BMI  31.09 Andrea Alvarez is currently in the action stage of change. As such, her goal is to maintain weight for now. She has agreed to the Category 2 Plan and keeping a food journal and adhering to recommended goals of 200-300 calories and 20 grams of  protein at breakfast.    Exercise goals:  Andrea Alvarez will increase frequency of exercise to 3 times per week. She was provided with exercise bands today.  Behavioral modification strategies: travel eating strategies.  Andrea Alvarez has agreed to follow-up with our clinic in 4 weeks.  Objective:   Blood pressure 117/83, pulse 82, temperature 98 F (36.7 C), height 5\' 2"  (1.575 m), weight 170 lb (77.1 kg), last menstrual period 11/16/2016, SpO2 98 %. Body mass index is 31.09 kg/m.  General: Cooperative, alert, well developed, in no acute distress. HEENT: Conjunctivae and lids unremarkable. Cardiovascular: Regular rhythm.  Lungs: Normal work of breathing. Neurologic: No focal deficits.   Lab Results  Component Value Date   CREATININE 0.75 03/31/2021   BUN 18 03/31/2021   NA 142 03/31/2021   K 4.9 03/31/2021   CL 102 03/31/2021   CO2 23 03/31/2021   Lab Results  Component Value Date   ALT 17 03/31/2021   AST 12 03/31/2021   ALKPHOS 84 03/31/2021   BILITOT 0.4 03/31/2021   Lab Results  Component Value Date   HGBA1C 5.3 03/31/2021   HGBA1C 5.2 07/04/2020   HGBA1C 5.3 07/02/2019  HGBA1C 5.4 08/28/2018   HGBA1C 5.2 05/16/2018   Lab Results  Component Value Date   INSULIN 10.8 03/31/2021   INSULIN 11.9 10/15/2020   INSULIN 13.8 08/28/2018   INSULIN 10.8 05/16/2018   Lab Results  Component Value Date   TSH 2.540 10/15/2020   Lab Results  Component Value Date   CHOL 239 (H) 03/31/2021   HDL 68 03/31/2021   LDLCALC 158 (H) 03/31/2021   TRIG 75 03/31/2021   CHOLHDL 3.2 10/15/2020   Lab Results  Component Value Date   VD25OH 51.6 03/31/2021   VD25OH 32.2 10/15/2020   VD25OH 42.2 08/28/2018   Lab Results  Component Value Date   WBC  6.3 10/15/2020   HGB 14.1 10/15/2020   HCT 43.7 10/15/2020   MCV 91 10/15/2020   PLT 352 10/15/2020   No results found for: IRON, TIBC, FERRITIN  Attestation Statements:   Reviewed by clinician on day of visit: allergies, medications, problem list, medical history, surgical history, family history, social history, and previous encounter notes.  I, Jackson Latino, RMA, am acting as Energy manager for [place], FNP.   I have reviewed the above documentation for accuracy and completeness, and I agree with the above. -  Jesse Sans, FNP

## 2021-07-08 ENCOUNTER — Encounter: Payer: Self-pay | Admitting: Physician Assistant

## 2021-07-28 ENCOUNTER — Other Ambulatory Visit (HOSPITAL_COMMUNITY): Payer: Self-pay

## 2021-07-28 ENCOUNTER — Other Ambulatory Visit: Payer: Self-pay

## 2021-07-28 ENCOUNTER — Ambulatory Visit (INDEPENDENT_AMBULATORY_CARE_PROVIDER_SITE_OTHER): Payer: No Typology Code available for payment source | Admitting: Family Medicine

## 2021-07-28 ENCOUNTER — Encounter (INDEPENDENT_AMBULATORY_CARE_PROVIDER_SITE_OTHER): Payer: Self-pay | Admitting: Family Medicine

## 2021-07-28 VITALS — BP 114/78 | HR 75 | Temp 97.9°F | Ht 62.0 in | Wt 166.0 lb

## 2021-07-28 DIAGNOSIS — Z6830 Body mass index (BMI) 30.0-30.9, adult: Secondary | ICD-10-CM

## 2021-07-28 DIAGNOSIS — E8881 Metabolic syndrome: Secondary | ICD-10-CM

## 2021-07-28 DIAGNOSIS — E7849 Other hyperlipidemia: Secondary | ICD-10-CM

## 2021-07-28 MED ORDER — TIRZEPATIDE 7.5 MG/0.5ML ~~LOC~~ SOAJ
7.5000 mg | SUBCUTANEOUS | 0 refills | Status: DC
  Filled 2021-07-28 (×2): qty 2, 28d supply, fill #0

## 2021-07-28 NOTE — Progress Notes (Addendum)
Chief Complaint:   OBESITY Andrea Alvarez is here to discuss her progress with her obesity treatment plan along with follow-up of her obesity related diagnoses. Andrea Alvarez is on the Category 2 Plan and keeping a food journal and adhering to recommended goals of 200-300 calories and 20 grams of protein  with breakfast options and states she is following her eating plan approximately 75% of the time. Andrea Alvarez states she is doing weight for 15 minutes 1 time per week and stretch bands for 20 minutes 2 times per week.  Today's visit was #: 14 Starting weight: 197 lbs Starting date: 10/15/2020 Today's weight: 166 lbs Today's date: 07/28/2021 Total lbs lost to date: 31 lbs Total lbs lost since last in-office visit: -4  Interim History: Andrea Alvarez did very well over the holidays on plan and is down 4 lbs. She has started using the resistance bands I gave her last office visit.  Subjective:   1. Insulin resistance Undra never picked up the 10 mg and has been on 5 mg of Mounjaro. She notes some dyspepsia. She takes Nexium PRN.  Lab Results  Component Value Date   INSULIN 10.8 03/31/2021   INSULIN 11.9 10/15/2020   INSULIN 13.8 08/28/2018   INSULIN 10.8 05/16/2018   Lab Results  Component Value Date   HGBA1C 5.3 03/31/2021    2. Other hyperlipidemia Urvi's ASCVD risk score is 1.6%. She is not on a statin. Her last LDL was 158.   Lab Results  Component Value Date   CHOL 239 (H) 03/31/2021   HDL 68 03/31/2021   LDLCALC 158 (H) 03/31/2021   TRIG 75 03/31/2021   CHOLHDL 3.2 10/15/2020   Lab Results  Component Value Date   ALT 17 03/31/2021   AST 12 03/31/2021   ALKPHOS 84 03/31/2021   BILITOT 0.4 03/31/2021   The 10-year ASCVD risk score (Arnett DK, et al., 2019) is: 1.6%   Values used to calculate the score:     Age: 76 years     Sex: Female     Is Non-Hispanic African American: No     Diabetic: No     Tobacco smoker: No     Systolic Blood Pressure: 114 mmHg     Is BP treated:  No     HDL Cholesterol: 68 mg/dL     Total Cholesterol: 239 mg/dL  Assessment/Plan:   1. Insulin resistance We will refill Mounjaro 7.5 mg weekly. We will change to Ozempic through [employer] if unable to get coverage. [employer] is her prescription plan.  - tirzepatide (MOUNJARO) 7.5 MG/0.5ML Pen; Inject 7.5 mg into the skin once a week.  Dispense: 2 mL; Refill: 0  2. Other hyperlipidemia Rigby will continue with weight loss and exercise.  No statin is indicated since 10-year ASCVD risk is 1.6%.  3. Obesity with current BMI 30.35 Neviah is currently in the action stage of change. As such, her goal is to continue with weight loss efforts. She has agreed to the Category 2 Plan and keeping a food journal and adhering to recommended goals of 200-300 calories and 20 grams of protein at breakfast.  Exercise goals:  As is  Behavioral modification strategies: planning for success.  Andrea Alvarez has agreed to follow-up with our clinic in 4 weeks (fasting).  Objective:   Blood pressure 114/78, pulse 75, temperature 97.9 F (36.6 C), height 5\' 2"  (1.575 m), weight 166 lb (75.3 kg), last menstrual period 11/16/2016, SpO2 99 %. Body mass index is 30.36 kg/m.  General: Cooperative, alert, well developed, in no acute distress. HEENT: Conjunctivae and lids unremarkable. Cardiovascular: Regular rhythm.  Lungs: Normal work of breathing. Neurologic: No focal deficits.   Lab Results  Component Value Date   CREATININE 0.75 03/31/2021   BUN 18 03/31/2021   NA 142 03/31/2021   K 4.9 03/31/2021   CL 102 03/31/2021   CO2 23 03/31/2021   Lab Results  Component Value Date   ALT 17 03/31/2021   AST 12 03/31/2021   ALKPHOS 84 03/31/2021   BILITOT 0.4 03/31/2021   Lab Results  Component Value Date   HGBA1C 5.3 03/31/2021   HGBA1C 5.2 07/04/2020   HGBA1C 5.3 07/02/2019   HGBA1C 5.4 08/28/2018   HGBA1C 5.2 05/16/2018   Lab Results  Component Value Date   INSULIN 10.8 03/31/2021   INSULIN 11.9  10/15/2020   INSULIN 13.8 08/28/2018   INSULIN 10.8 05/16/2018   Lab Results  Component Value Date   TSH 2.540 10/15/2020   Lab Results  Component Value Date   CHOL 239 (H) 03/31/2021   HDL 68 03/31/2021   LDLCALC 158 (H) 03/31/2021   TRIG 75 03/31/2021   CHOLHDL 3.2 10/15/2020   Lab Results  Component Value Date   VD25OH 51.6 03/31/2021   VD25OH 32.2 10/15/2020   VD25OH 42.2 08/28/2018   Lab Results  Component Value Date   WBC 6.3 10/15/2020   HGB 14.1 10/15/2020   HCT 43.7 10/15/2020   MCV 91 10/15/2020   PLT 352 10/15/2020   No results found for: IRON, TIBC, FERRITIN  Attestation Statements:   Reviewed by clinician on day of visit: allergies, medications, problem list, medical history, surgical history, family history, social history, and previous encounter notes.  I, Jackson Latino, RMA, am acting as Energy manager for [place], FNP.  I have reviewed the above documentation for accuracy and completeness, and I agree with the above. -  Jesse Sans, FNP

## 2021-07-28 NOTE — Progress Notes (Signed)
The 10-year ASCVD risk score (Arnett DK, et al., 2019) is: 1.6%   Values used to calculate the score:     Age: 56 years     Sex: Female     Is Non-Hispanic African American: No     Diabetic: No     Tobacco smoker: No     Systolic Blood Pressure: 114 mmHg     Is BP treated: No     HDL Cholesterol: 68 mg/dL     Total Cholesterol: 239 mg/dL

## 2021-08-06 ENCOUNTER — Encounter: Payer: No Typology Code available for payment source | Admitting: Physician Assistant

## 2021-08-18 ENCOUNTER — Encounter: Payer: Self-pay | Admitting: Physician Assistant

## 2021-08-18 ENCOUNTER — Other Ambulatory Visit: Payer: Self-pay

## 2021-08-18 ENCOUNTER — Ambulatory Visit (INDEPENDENT_AMBULATORY_CARE_PROVIDER_SITE_OTHER): Payer: No Typology Code available for payment source | Admitting: Physician Assistant

## 2021-08-18 VITALS — BP 130/78 | HR 76 | Ht 62.0 in | Wt 169.4 lb

## 2021-08-18 DIAGNOSIS — E782 Mixed hyperlipidemia: Secondary | ICD-10-CM | POA: Diagnosis not present

## 2021-08-18 DIAGNOSIS — Z1231 Encounter for screening mammogram for malignant neoplasm of breast: Secondary | ICD-10-CM | POA: Diagnosis not present

## 2021-08-18 DIAGNOSIS — Z0000 Encounter for general adult medical examination without abnormal findings: Secondary | ICD-10-CM | POA: Diagnosis not present

## 2021-08-18 DIAGNOSIS — Z1159 Encounter for screening for other viral diseases: Secondary | ICD-10-CM

## 2021-08-18 DIAGNOSIS — Z6830 Body mass index (BMI) 30.0-30.9, adult: Secondary | ICD-10-CM

## 2021-08-18 NOTE — Patient Instructions (Signed)
Recombinant Zoster (Shingles) Vaccine: What You Need to Know °1. Why get vaccinated? °Recombinant zoster (shingles) vaccine can prevent shingles. °Shingles (also called herpes zoster, or just zoster) is a painful skin rash, usually with blisters. In addition to the rash, shingles can cause fever, headache, chills, or upset stomach. Rarely, shingles can lead to complications such as pneumonia, hearing problems, blindness, brain inflammation (encephalitis), or death. °The risk of shingles increases with age. The most common complication of shingles is long-term nerve pain called postherpetic neuralgia (PHN). PHN occurs in the areas where the shingles rash was and can last for months or years after the rash goes away. The pain from PHN can be severe and debilitating. °The risk of PHN increases with age. An older adult with shingles is more likely to develop PHN and have longer lasting and more severe pain than a younger person. °People with weakened immune systems also have a higher risk of getting shingles and complications from the disease. °Shingles is caused by varicella-zoster virus, the same virus that causes chickenpox. After you have chickenpox, the virus stays in your body and can cause shingles later in life. Shingles cannot be passed from one person to another, but the virus that causes shingles can spread and cause chickenpox in someone who has never had chickenpox or has never received chickenpox vaccine. °2. Recombinant shingles vaccine °Recombinant shingles vaccine provides strong protection against shingles. By preventing shingles, recombinant shingles vaccine also protects against PHN and other complications. °Recombinant shingles vaccine is recommended for: °Adults 50 years and older °Adults 19 years and older who have a weakened immune system because of disease or treatments °Shingles vaccine is given as a two-dose series. For most people, the second dose should be given 2 to 6 months after the first  dose. Some people who have or will have a weakened immune system can get the second dose 1 to 2 months after the first dose. Ask your health care provider for guidance. °People who have had shingles in the past and people who have received varicella (chickenpox) vaccine are recommended to get recombinant shingles vaccine. The vaccine is also recommended for people who have already gotten another type of shingles vaccine, the live shingles vaccine. There is no live virus in recombinant shingles vaccine. °Shingles vaccine may be given at the same time as other vaccines. °3. Talk with your health care provider °Tell your vaccination provider if the person getting the vaccine: °Has had an allergic reaction after a previous dose of recombinant shingles vaccine, or has any severe, life-threatening allergies °Is currently experiencing an episode of shingles °Is pregnant °In some cases, your health care provider may decide to postpone shingles vaccination until a future visit. °People with minor illnesses, such as a cold, may be vaccinated. People who are moderately or severely ill should usually wait until they recover before getting recombinant shingles vaccine. °Your health care provider can give you more information. °4. Risks of a vaccine reaction °A sore arm with mild or moderate pain is very common after recombinant shingles vaccine. Redness and swelling can also happen at the site of the injection. °Tiredness, muscle pain, headache, shivering, fever, stomach pain, and nausea are common after recombinant shingles vaccine. °These side effects may temporarily prevent a vaccinated person from doing regular activities. Symptoms usually go away on their own in 2 to 3 days. You should still get the second dose of recombinant shingles vaccine even if you had one of these reactions after the first dose. °Guillain-Barré   syndrome (GBS), a serious nervous system disorder, has been reported very rarely after recombinant zoster  vaccine. °People sometimes faint after medical procedures, including vaccination. Tell your provider if you feel dizzy or have vision changes or ringing in the ears. °As with any medicine, there is a very remote chance of a vaccine causing a severe allergic reaction, other serious injury, or death. °5. What if there is a serious problem? °An allergic reaction could occur after the vaccinated person leaves the clinic. If you see signs of a severe allergic reaction (hives, swelling of the face and throat, difficulty breathing, a fast heartbeat, dizziness, or weakness), call 9-1-1 and get the person to the nearest hospital. °For other signs that concern you, call your health care provider. °Adverse reactions should be reported to the Vaccine Adverse Event Reporting System (VAERS). Your health care provider will usually file this report, or you can do it yourself. Visit the VAERS website at www.vaers.hhs.gov or call 1-800-822-7967. VAERS is only for reporting reactions, and VAERS staff members do not give medical advice. °6. How can I learn more? °Ask your health care provider. °Call your local or state health department. °Visit the website of the Food and Drug Administration (FDA) for vaccine package inserts and additional information at www.fda.gov/vaccinesblood-biologics/vaccines. °Contact the Centers for Disease Control and Prevention (CDC): °Call 1-800-232-4636 (1-800-CDC-INFO) or °Visit CDC's website at www.cdc.gov/vaccines. °Vaccine Information Statement Recombinant Zoster Vaccine (08/22/2020) °This information is not intended to replace advice given to you by your health care provider. Make sure you discuss any questions you have with your health care provider. °Document Revised: 03/20/2021 Document Reviewed: 09/05/2020 °Elsevier Patient Education © 2022 Elsevier Inc. ° °

## 2021-08-18 NOTE — Progress Notes (Signed)
I,Andrea Alvarez,acting as a Neurosurgeon for [employer], PA-C.,have documented all relevant documentation on the behalf of Andrea Ferguson, PA-C,as directed by  Andrea Ferguson, PA-C while in the presence of Andrea Ferguson, PA-C.  Complete physical exam   Patient: Andrea Alvarez   DOB: [birth year]   56 y.o. Female  MRN: [id number] Visit Date: 08/18/2021  Today's healthcare provider: [name], PA-C   Chief Complaint  Patient presents with   Annual Exam   Subjective    ROLLA Alvarez is a 56 y.o. female who presents today for a complete physical exam.  She reports consuming a  high protein, low carb  diet.  The patient reports using her weight loss band and weighted hula hoop at least 2xs weekly for 20 minutes with mild intensity  She generally feels well. She reports sleeping well. She does not have additional problems to discuss today.   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   Dr.Madison Clark   REDUCTION MAMMAPLASTY Bilateral 1999   TUBAL LIGATION  1989   Social History   Socioeconomic History   Marital status: Married    Spouse name: Denesha Brouse   Number of children: Not on file   Years of education: Not on file   Highest education level: Not on file  Occupational History   Occupation: Fish farm manager part time retail   Occupation: Runner, broadcasting/film/video  Tobacco Use   Smoking status: Never   Smokeless tobacco: Never  Vaping Use   Vaping Use: Never used  Substance and Sexual Activity   Alcohol use: Yes    Comment: Occasional alcohol use; once or twice a year   Drug use: No    Sexual activity: Not on file  Other Topics Concern   Not on file  Social History Narrative   Not on file   Social Determinants of Health   Financial Resource Strain: Not on file  Food Insecurity: Not on file  Transportation Needs: Not on file  Physical Activity: Not on file  Stress: Not on file  Social Connections: Not on file  Intimate Partner Violence: Not on file   Family Status  Relation Name Status   Mother  Alive       Melanoma   Father  Deceased   Sister  Alive   MGM  Deceased at age 42       died from an MI   PGM  Deceased   Neg Hx  (Not Specified)   Family History  Problem Relation Age of Onset   Hypertension Mother    Cancer Mother    Cancer Father        bladder cancer   Asthma Sister    Heart disease Sister    Coronary artery disease Maternal Grandmother    Coronary artery disease Paternal Grandmother    Heart attack Paternal Grandmother    Asthma Paternal Grandmother    Colon cancer Neg Hx    Colon polyps Neg Hx    Esophageal  cancer Neg Hx    Rectal cancer Neg Hx    Stomach cancer Neg Hx    No Known Allergies  Patient Care Team: Andrea Ferguson, PA-C as PCP - General (Physician Assistant)   Medications: Outpatient Medications Prior to Visit  Medication Sig   tirzepatide [facility]) 7.5 MG/0.5ML Pen Inject 7.5 mg into the skin once a week.   Vitamin D, Ergocalciferol, (DRISDOL) 1.25 MG (50000 UNIT) CAPS capsule Take 1 capsule (50,000 Units total) by mouth every 7 (seven) days.   No facility-administered medications prior to visit.    Review of Systems  Constitutional: Negative.  Negative for fatigue and fever.  Eyes: Negative.   Respiratory:  Negative for cough and shortness of breath.   Cardiovascular: Negative.  Negative for chest pain and leg swelling.  Gastrointestinal: Negative.  Negative for abdominal pain.  Endocrine: Negative.   Genitourinary: Negative.   Musculoskeletal: Negative.   Skin: Negative.   Allergic/Immunologic:  Negative.   Neurological: Negative.  Negative for dizziness and headaches.  Hematological: Negative.   Psychiatric/Behavioral: Negative.       Objective    BP 130/78 (BP Location: Left Arm, Patient Position: Sitting, Cuff Size: Normal)    Pulse 76    Ht 5\' 2"  (1.575 m)    Wt 169 lb 6.4 oz (76.8 kg)    LMP 11/16/2016 (Approximate)    SpO2 98%    BMI 30.98 kg/m  Blood pressure 130/78, pulse 76, height 5\' 2"  (1.575 m), weight 169 lb 6.4 oz (76.8 kg), last menstrual period 11/16/2016, SpO2 98 %.   Physical Exam Constitutional:      General: She is awake.     Appearance: She is well-developed.  HENT:     Head: Normocephalic.     Right Ear: Tympanic membrane normal.     Left Ear: Tympanic membrane normal.  Eyes:     Conjunctiva/sclera: Conjunctivae normal.     Pupils: Pupils are equal, round, and reactive to light.  Neck:     Thyroid: No thyroid mass or thyromegaly.  Cardiovascular:     Rate and Rhythm: Normal rate and regular rhythm.     Heart sounds: Normal heart sounds.  Pulmonary:     Effort: Pulmonary effort is normal.     Breath sounds: Normal breath sounds.  Abdominal:     Palpations: Abdomen is soft.     Tenderness: There is no abdominal tenderness.  Musculoskeletal:     Right lower leg: No swelling.     Left lower leg: No swelling.  Lymphadenopathy:     Cervical: No cervical adenopathy.  Skin:    General: Skin is warm.  Neurological:     Mental Status: She is alert and oriented to person, place, and time.  Psychiatric:        Attention and Perception: Attention normal.        Mood and Affect: Mood normal.        Speech: Speech normal.        Behavior: Behavior is cooperative.     Last depression screening scores PHQ 2/9 Scores 08/18/2021 10/15/2020 07/04/2020  PHQ - 2 Score 0 1 0  PHQ- 9 Score 0 6 -   Last fall risk screening Fall Risk  08/18/2021  Falls in the past year? 0  Number falls in past yr: 0  Injury with Fall? 0  Risk for fall due to : No Fall  Risks  Follow up -   Last Audit-C alcohol use screening Alcohol Use Disorder  Test (AUDIT) 08/18/2021  1. How often do you have a drink containing alcohol? 2  2. How many drinks containing alcohol do you have on a typical day when you are drinking? 0  3. How often do you have six or more drinks on one occasion? 0  AUDIT-C Score 2  Alcohol Brief Interventions/Follow-up -   A score of 3 or more in women, and 4 or more in men indicates increased risk for alcohol abuse, EXCEPT if all of the points are from question 1   No results found for any visits on 08/18/21.  Assessment & Plan    Routine Health Maintenance and Physical Exam  Exercise Activities and Dietary recommendations --Continue w/ weight management   Immunization History  Administered Date(s) Administered   Influenza Split 05/13/2010   Influenza,inj,Quad PF,6+ Mos 05/13/2014, 05/16/2015, 04/23/2016, 04/28/2018, 07/02/2019, 07/04/2020   Influenza-Unspecified 04/14/2021   PFIZER(Purple Top)SARS-COV-2 Vaccination 10/29/2019, 11/19/2019, 07/04/2020   Tdap 05/13/2014, 08/06/2020    Health Maintenance  Topic Date Due   Hepatitis C Screening  Never done   MAMMOGRAM  03/29/2020   COVID-19 Vaccine (4 - Booster for Pfizer series) 09/03/2021 (Originally 08/29/2020)   Zoster Vaccines- Shingrix (1 of 2) 11/15/2021 (Originally 10/13/2015)   PAP SMEAR-Modifier  02/02/2023   COLONOSCOPY (Pts [id number] Insurance coverage will need to be confirmed)  07/15/2026   TETANUS/TDAP  08/06/2030   INFLUENZA VACCINE  Completed   HIV Screening  Completed   HPV VACCINES  Aged Out    Discussed health benefits of physical activity, and encouraged her to engage in regular exercise appropriate for her age and condition.  Problem List Items Addressed This Visit       Other   BMI 30.0-30.9,adult    Significant weight loss over the last year, 209 to 169 today.  Continue w/ weight management, they checked tsh, a1c, not long ago all wnl Recheck lipid  panel as ~20 lb weight loss since last checked, likely improved.       Other Visit Diagnoses     Encounter for annual physical exam    -  Primary   Encounter for screening mammogram for breast cancer       Relevant Orders   MM 3D SCREEN BREAST BILATERAL   Encounter for hepatitis C screening test for low risk patient       Relevant Orders   Hepatitis C antibody   Moderate mixed hyperlipidemia not requiring statin therapy       Relevant Orders   Comprehensive Metabolic Panel (CMET)   Lipid Profile        Return in about 1 year (around 08/18/2022) for CPE.     I, Andrea FergusonLindsay Marylou Wages, PA-C have reviewed all documentation for this visit. The documentation on  08/18/2021  for the exam, diagnosis, procedures, and orders are all accurate and complete.    Andrea FergusonLindsay Avalene Sealy, PA-C  [facility] [phone] (phone) [phone] (fax)  [facility]

## 2021-08-18 NOTE — Assessment & Plan Note (Signed)
Significant weight loss over the last year, 209 to 169 today.  Continue w/ weight management, they checked tsh, a1c, not long ago all wnl Recheck lipid panel as ~20 lb weight loss since last checked, likely improved.

## 2021-08-24 ENCOUNTER — Other Ambulatory Visit (INDEPENDENT_AMBULATORY_CARE_PROVIDER_SITE_OTHER): Payer: Self-pay | Admitting: Bariatrics

## 2021-08-24 DIAGNOSIS — E8881 Metabolic syndrome: Secondary | ICD-10-CM

## 2021-08-24 NOTE — Telephone Encounter (Signed)
LOV w/ Dawn

## 2021-08-25 ENCOUNTER — Other Ambulatory Visit (HOSPITAL_COMMUNITY): Payer: Self-pay

## 2021-08-25 ENCOUNTER — Ambulatory Visit (INDEPENDENT_AMBULATORY_CARE_PROVIDER_SITE_OTHER): Payer: No Typology Code available for payment source | Admitting: Family Medicine

## 2021-08-25 ENCOUNTER — Encounter (INDEPENDENT_AMBULATORY_CARE_PROVIDER_SITE_OTHER): Payer: Self-pay | Admitting: Family Medicine

## 2021-08-25 ENCOUNTER — Other Ambulatory Visit: Payer: Self-pay

## 2021-08-25 VITALS — BP 123/70 | HR 75 | Temp 98.1°F | Ht 62.0 in | Wt 166.0 lb

## 2021-08-25 DIAGNOSIS — E669 Obesity, unspecified: Secondary | ICD-10-CM

## 2021-08-25 DIAGNOSIS — Z6830 Body mass index (BMI) 30.0-30.9, adult: Secondary | ICD-10-CM | POA: Diagnosis not present

## 2021-08-25 DIAGNOSIS — E559 Vitamin D deficiency, unspecified: Secondary | ICD-10-CM | POA: Diagnosis not present

## 2021-08-25 DIAGNOSIS — E8881 Metabolic syndrome: Secondary | ICD-10-CM | POA: Diagnosis not present

## 2021-08-25 DIAGNOSIS — E7849 Other hyperlipidemia: Secondary | ICD-10-CM

## 2021-08-25 DIAGNOSIS — E88819 Insulin resistance, unspecified: Secondary | ICD-10-CM

## 2021-08-25 MED ORDER — VITAMIN D (ERGOCALCIFEROL) 1.25 MG (50000 UNIT) PO CAPS
50000.0000 [IU] | ORAL_CAPSULE | ORAL | 0 refills | Status: DC
  Filled 2021-08-25 – 2021-09-03 (×2): qty 12, 84d supply, fill #0

## 2021-08-25 MED ORDER — TIRZEPATIDE 10 MG/0.5ML ~~LOC~~ SOAJ
10.0000 mg | SUBCUTANEOUS | 0 refills | Status: DC
  Filled 2021-08-25: qty 2, 28d supply, fill #0

## 2021-08-25 NOTE — Progress Notes (Signed)
Chief Complaint:   OBESITY Andrea Alvarez is here to discuss her progress with her obesity treatment plan along with follow-up of her obesity related diagnoses. Andrea Alvarez is on the Category 2 Plan and keeping a food journal and adhering to recommended goals of 200-300 calories and 20 grams of protein and states she is following her eating plan approximately 40% of the time. Andrea Alvarez states she is doing stretch bands for 15 minutes 2-3 times per week.  Today's visit was #: 15 Starting weight: 197 lbs Starting date: 10/15/2020 Today's weight: 166 lbs Today's date: 08/25/2021 Total lbs lost to date: 31 lbs Total lbs lost since last in-office visit: 0  Interim History: Andrea Alvarez recently went on a trip to [place]. She did PC/[place] while on the trip.She maintained her weight today.  She is now back to Category 2. Her goal weight is 150 lbs. She has lost 50 lbs since December 2021. She drinks unsweetened  tea all day. Her protein intake is good.     Subjective:   1. Insulin resistance Andrea Alvarez has been having some GERD with Mounjaro. She is able to manage it well with Nexium as needed. Her appetite is well controlled.   Lab Results  Component Value Date   HGBA1C 5.3 03/31/2021   Lab Results  Component Value Date   INSULIN 10.8 03/31/2021   INSULIN 11.9 10/15/2020   INSULIN 13.8 08/28/2018   INSULIN 10.8 05/16/2018    2. Vitamin D insufficiency Andrea Alvarez's Vitamin D is at goal (51.6). She is on weekly prescription Vitamin D.  Lab Results  Component Value Date   VD25OH 51.6 03/31/2021   VD25OH 32.2 10/15/2020   VD25OH 42.2 08/28/2018     Assessment/Plan:   1. Insulin resistance We will refill Mounaro 10 mg weekly.  - tirzepatide (MOUNJARO) 10 MG/0.5ML Pen; Inject 1 pen (10 mg) into the skin once a week.  Dispense: 2 mL; Refill: 0  We will refill prescription Vitamin D 50,000 IU weekly and Andrea Alvarez will follow-up for routine testing of Vitamin D, at least 2-3 times per year to avoid  over-replacement.  - Vitamin D, Ergocalciferol, (DRISDOL) 1.25 MG (50000 UNIT) CAPS capsule; Take 1 capsule by mouth every 7 days.  Dispense: 12 capsule; Refill: 0  3. Obesity with current BMI 30.35 Andrea Alvarez is currently in the action stage of change. As such, her goal is to continue with weight loss efforts. She has agreed to the Category 2 Plan and keeping a food journal and adhering to recommended goals of 200-300 calories and 20 grams of protein at breakfast.  Exercise goals:  As is.  Behavioral modification strategies: planning for success.  Andrea Alvarez has agreed to follow-up with our clinic in 4 weeks.  Objective:   Blood pressure 123/70, pulse 75, temperature 98.1 F (36.7 C), height 5\' 2"  (1.575 m), weight 166 lb (75.3 kg), last menstrual period 11/16/2016, SpO2 100 %. Body mass index is 30.36 kg/m.  General: Cooperative, alert, well developed, in no acute distress. HEENT: Conjunctivae and lids unremarkable. Cardiovascular: Regular rhythm.  Lungs: Normal work of breathing. Neurologic: No focal deficits.   Lab Results  Component Value Date   CREATININE 0.75 03/31/2021   BUN 18 03/31/2021   NA 142 03/31/2021   K 4.9 03/31/2021   CL 102 03/31/2021   CO2 23 03/31/2021   Lab Results  Component Value Date   ALT 17 03/31/2021   AST 12 03/31/2021   ALKPHOS 84 03/31/2021   BILITOT 0.4 03/31/2021  Lab Results  Component Value Date   HGBA1C 5.3 03/31/2021   HGBA1C 5.2 07/04/2020   HGBA1C 5.3 07/02/2019   HGBA1C 5.4 08/28/2018   HGBA1C 5.2 05/16/2018   Lab Results  Component Value Date   INSULIN 10.8 03/31/2021   INSULIN 11.9 10/15/2020   INSULIN 13.8 08/28/2018   INSULIN 10.8 05/16/2018   Lab Results  Component Value Date   TSH 2.540 10/15/2020   Lab Results  Component Value Date   CHOL 239 (H) 03/31/2021   HDL 68 03/31/2021   LDLCALC 158 (H) 03/31/2021   TRIG 75 03/31/2021   CHOLHDL 3.2 10/15/2020   Lab Results  Component Value Date   VD25OH 51.6  03/31/2021   VD25OH 32.2 10/15/2020   VD25OH 42.2 08/28/2018   Lab Results  Component Value Date   WBC 6.3 10/15/2020   HGB 14.1 10/15/2020   HCT 43.7 10/15/2020   MCV 91 10/15/2020   PLT 352 10/15/2020   No results found for: IRON, TIBC, FERRITIN  Attestation Statements:   Reviewed by clinician on day of visit: allergies, medications, problem list, medical history, surgical history, family history, social history, and previous encounter notes.  I, Jackson Latino, RMA, am acting as Energy manager for [place], FNP.  I have reviewed the above documentation for accuracy and completeness, and I agree with the above. -  Jesse Sans, FNP

## 2021-08-26 ENCOUNTER — Other Ambulatory Visit (HOSPITAL_COMMUNITY): Payer: Self-pay

## 2021-09-02 ENCOUNTER — Other Ambulatory Visit (HOSPITAL_COMMUNITY): Payer: Self-pay

## 2021-09-03 ENCOUNTER — Other Ambulatory Visit (INDEPENDENT_AMBULATORY_CARE_PROVIDER_SITE_OTHER): Payer: Self-pay | Admitting: Bariatrics

## 2021-09-03 ENCOUNTER — Other Ambulatory Visit (HOSPITAL_COMMUNITY): Payer: Self-pay

## 2021-09-03 DIAGNOSIS — E559 Vitamin D deficiency, unspecified: Secondary | ICD-10-CM

## 2021-09-07 ENCOUNTER — Other Ambulatory Visit: Payer: Self-pay

## 2021-09-07 ENCOUNTER — Encounter (HOSPITAL_BASED_OUTPATIENT_CLINIC_OR_DEPARTMENT_OTHER): Payer: Self-pay

## 2021-09-07 ENCOUNTER — Ambulatory Visit (HOSPITAL_BASED_OUTPATIENT_CLINIC_OR_DEPARTMENT_OTHER)
Admission: RE | Admit: 2021-09-07 | Discharge: 2021-09-07 | Disposition: A | Discharge status: Home / Self Care | Payer: No Typology Code available for payment source | Source: Ambulatory Visit | Attending: Physician Assistant | Admitting: Physician Assistant

## 2021-09-07 DIAGNOSIS — Z1231 Encounter for screening mammogram for malignant neoplasm of breast: Secondary | ICD-10-CM | POA: Diagnosis present

## 2021-09-21 ENCOUNTER — Other Ambulatory Visit: Payer: Self-pay

## 2021-09-21 ENCOUNTER — Other Ambulatory Visit (HOSPITAL_COMMUNITY): Payer: Self-pay

## 2021-09-21 ENCOUNTER — Encounter (INDEPENDENT_AMBULATORY_CARE_PROVIDER_SITE_OTHER): Payer: Self-pay | Admitting: Bariatrics

## 2021-09-21 ENCOUNTER — Ambulatory Visit (INDEPENDENT_AMBULATORY_CARE_PROVIDER_SITE_OTHER): Payer: No Typology Code available for payment source | Admitting: Bariatrics

## 2021-09-21 VITALS — BP 119/78 | HR 71 | Temp 98.1°F | Ht 62.0 in | Wt 161.0 lb

## 2021-09-21 DIAGNOSIS — Z6839 Body mass index (BMI) 39.0-39.9, adult: Secondary | ICD-10-CM

## 2021-09-21 DIAGNOSIS — E8881 Metabolic syndrome: Secondary | ICD-10-CM | POA: Diagnosis not present

## 2021-09-21 DIAGNOSIS — E559 Vitamin D deficiency, unspecified: Secondary | ICD-10-CM | POA: Diagnosis not present

## 2021-09-21 DIAGNOSIS — E669 Obesity, unspecified: Secondary | ICD-10-CM

## 2021-09-21 MED ORDER — TIRZEPATIDE 10 MG/0.5ML ~~LOC~~ SOAJ
10.0000 mg | SUBCUTANEOUS | 0 refills | Status: DC
Start: 2021-09-21 — End: 2021-10-19
  Filled 2021-09-21 – 2021-09-24 (×2): qty 2, 28d supply, fill #0

## 2021-09-22 NOTE — Progress Notes (Signed)
? ? ? ?Chief Complaint:  ? ?OBESITY ?Andrea Alvarez is here to discuss her progress with her obesity treatment plan along with follow-up of her obesity related diagnoses. Andrea Alvarez is on the Category 2 Plan and states she is following her eating plan approximately 20% of the time. Andrea Alvarez states she is pilates for 10 minutes 3-4 days and cardio for 20 minutes 1 times per week. ? ?Today's visit was #: 16 ?Starting weight: 197 lbs ?Starting date: 10/15/2020 ?Today's weight: 161 lbs ?Today's date: 09/21/2021 ?Total lbs lost to date: 36 lbs ?Total lbs lost since last in-office visit: 5 lbs ? ?Interim History: Andrea Alvarez is down 5 lbs since her last visit. She is doing well with protein and water intake.  ? ?Subjective:  ? ?1. Insulin resistance ?Andrea Alvarez states Andrea Alvarez is helping with appetite suppression.  ? ?2. Vitamin D insufficiency ?Andrea Alvarez is taking Vitamin D currently.  ? ?Assessment/Plan:  ? ?1. Insulin resistance ?We will refill Mounjaro 10 mg for 1 month with no refills. Andrea Alvarez will continue to work on weight loss, exercise, and decreasing simple carbohydrates to help decrease the risk of diabetes. Andrea Alvarez agreed to follow-up with [place] as directed to closely monitor her progress. ? ?- tirzepatide (MOUNJARO) 10 MG/0.5ML Pen; Inject 1 pen (10 mg) into the skin once a week.  Dispense: 2 mL; Refill: 0 ? ?2. Vitamin D insufficiency ?Low Vitamin D level contributes to fatigue and are associated with obesity, breast, and colon cancer. Andrea Alvarez agrees to continue to take prescription Vitamin D and she will follow-up for routine testing of Vitamin D, at least 2-3 times per year to avoid over-replacement. ? ?3. Obesity with current BMI 39.6 ?Andrea Alvarez is currently in the action stage of change. As such, her goal is to continue with weight loss efforts. She has agreed to the Category 1 Plan plus 100 calories.  ? ?Andrea Alvarez will continue meal planning and she will continue intentional eating. She changed to Category 1 plus 100 calories.  ? ?Exercise  goals:  As is. ? ?Behavioral modification strategies: increasing lean protein intake, decreasing simple carbohydrates, increasing vegetables, increasing water intake, decreasing eating out, no skipping meals, meal planning and cooking strategies, keeping healthy foods in the home, and planning for success. ? ?Andrea Alvarez has agreed to follow-up with our clinic in 3-4 weeks with myself,or Andrea Bathe, FNP or Andrea Potash, PA-C. She was informed of the importance of frequent follow-up visits to maximize her success with intensive lifestyle modifications for her multiple health conditions.  ? ?Objective:  ? ?Blood pressure 119/78, pulse 71, temperature 98.1 ?F (36.7 ?C), height 5\' 2"  (1.575 m), weight 161 lb (73 kg), last menstrual period 11/16/2016, SpO2 96 %. ?Body mass index is 29.45 kg/m?. ? ?General: Cooperative, alert, well developed, in no acute distress. ?HEENT: Conjunctivae and lids unremarkable. ?Cardiovascular: Regular rhythm.  ?Lungs: Normal work of breathing. ?Neurologic: No focal deficits.  ? ?Lab Results  ?Component Value Date  ? CREATININE 0.75 03/31/2021  ? BUN 18 03/31/2021  ? NA 142 03/31/2021  ? K 4.9 03/31/2021  ? CL 102 03/31/2021  ? CO2 23 03/31/2021  ? ?Lab Results  ?Component Value Date  ? ALT 17 03/31/2021  ? AST 12 03/31/2021  ? ALKPHOS 84 03/31/2021  ? BILITOT 0.4 03/31/2021  ? ?Lab Results  ?Component Value Date  ? HGBA1C 5.3 03/31/2021  ? HGBA1C 5.2 07/04/2020  ? HGBA1C 5.3 07/02/2019  ? HGBA1C 5.4 08/28/2018  ? HGBA1C 5.2 05/16/2018  ? ?Lab Results  ?Component Value Date  ?  INSULIN 10.8 03/31/2021  ? INSULIN 11.9 10/15/2020  ? INSULIN 13.8 08/28/2018  ? INSULIN 10.8 05/16/2018  ? ?Lab Results  ?Component Value Date  ? TSH 2.540 10/15/2020  ? ?Lab Results  ?Component Value Date  ? CHOL 239 (H) 03/31/2021  ? HDL 68 03/31/2021  ? LDLCALC 158 (H) 03/31/2021  ? TRIG 75 03/31/2021  ? CHOLHDL 3.2 10/15/2020  ? ?Lab Results  ?Component Value Date  ? VD25OH 51.6 03/31/2021  ? VD25OH 32.2 10/15/2020   ? VD25OH 42.2 08/28/2018  ? ?Lab Results  ?Component Value Date  ? WBC 6.3 10/15/2020  ? HGB 14.1 10/15/2020  ? HCT 43.7 10/15/2020  ? MCV 91 10/15/2020  ? PLT 352 10/15/2020  ? ?No results found for: IRON, TIBC, FERRITIN ? ?Attestation Statements:  ? ?Reviewed by clinician on day of visit: allergies, medications, problem list, medical history, surgical history, family history, social history, and previous encounter notes. ? ?I, Andrea Alvarez, RMA, am acting as transcriptionist for [employer], DO. ? ?I have reviewed the above documentation for accuracy and completeness, and I agree with the above. Andrea Lesch, DO ? ?

## 2021-09-23 ENCOUNTER — Encounter (INDEPENDENT_AMBULATORY_CARE_PROVIDER_SITE_OTHER): Payer: Self-pay | Admitting: Bariatrics

## 2021-09-24 ENCOUNTER — Other Ambulatory Visit (HOSPITAL_COMMUNITY): Payer: Self-pay

## 2021-09-24 ENCOUNTER — Encounter: Payer: Self-pay | Admitting: Physician Assistant

## 2021-09-24 ENCOUNTER — Other Ambulatory Visit: Payer: Self-pay

## 2021-09-24 ENCOUNTER — Ambulatory Visit (INDEPENDENT_AMBULATORY_CARE_PROVIDER_SITE_OTHER): Payer: No Typology Code available for payment source | Admitting: Physician Assistant

## 2021-09-24 VITALS — BP 108/69 | HR 74 | Temp 98.1°F | Ht 62.0 in | Wt 162.5 lb

## 2021-09-24 DIAGNOSIS — J069 Acute upper respiratory infection, unspecified: Secondary | ICD-10-CM

## 2021-09-24 DIAGNOSIS — J020 Streptococcal pharyngitis: Secondary | ICD-10-CM

## 2021-09-24 LAB — POCT RAPID STREP A (OFFICE): Rapid Strep A Screen: POSITIVE — AB

## 2021-09-24 MED ORDER — AZELASTINE HCL 0.1 % NA SOLN
2.0000 | Freq: Two times a day (BID) | NASAL | 6 refills | Status: DC
  Filled 2021-09-24: qty 30, 25d supply, fill #0

## 2021-09-24 MED ORDER — AMOXICILLIN 500 MG PO CAPS
500.0000 mg | ORAL_CAPSULE | Freq: Two times a day (BID) | ORAL | 0 refills | Status: AC
  Filled 2021-09-24: qty 20, 10d supply, fill #0

## 2021-09-24 NOTE — Progress Notes (Signed)
? ?Acute Office Visit ? ?Subjective:  ? ? Patient ID: Andrea Alvarez, female    DOB: [birth year], 56 y.o.   MRN: [id number] ? ?Cc. Hoarseness, cough, sore throat x 4 days ? ?HPI ?Andrea Alvarez is a 56 y/o female who presents today with concerns over hoarseness, sore throat, cough x 4 days. Reports the swelling in her throat and pain have progressed but overall feels better. Reports additional nasal congestion. Denies fevers, difficulty breathing or swallowing, SOB, chest pain.  ?She has been taking alka seltzer cold OTC for symptom relief.  ? ?Past Medical History:  ?Diagnosis Date  ? Anemia   ? Back pain   ? Constipation   ? GERD (gastroesophageal reflux disease)   ? Joint pain   ? Leg edema   ? Obesity   ? SOBOE (shortness of breath on exertion)   ? Swelling of both lower extremities   ? Vitamin D deficiency   ? ? ?Past Surgical History:  ?Procedure Laterality Date  ? BREAST SURGERY  02/1999  ? status post bilateral breast reduction  ? 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 Dr.Madison Carlis Abbott  ? REDUCTION MAMMAPLASTY Bilateral 1999  ? TUBAL LIGATION  1989  ? ? ?Family History  ?Problem Relation Age of Onset  ? Hypertension Mother   ? Cancer Mother   ? Cancer Father   ?     bladder cancer  ? Asthma Sister   ? Heart disease Sister   ? Coronary artery disease Maternal Grandmother   ? Coronary artery disease Paternal Grandmother   ? Heart attack Paternal Grandmother   ? Asthma Paternal Grandmother   ? Colon cancer Neg Hx   ? Colon polyps Neg Hx   ? Esophageal cancer Neg Hx   ? Rectal cancer Neg Hx   ? Stomach cancer Neg Hx   ? ? ?Social History  ? ?Socioeconomic History  ? Marital status: Married  ?  Spouse name: Zissy Hamlett  ? Number of children: Not on file  ? Years of education: Not on file  ? Highest education level: Not on file  ?Occupational History  ? Occupation: Barrister's clerk part time retail  ? Occupation: Fish farm manager  ?Tobacco Use  ? Smoking status: Never  ? Smokeless tobacco: Never  ?Vaping Use  ? Vaping  Use: Never used  ?Substance and Sexual Activity  ? Alcohol use: Yes  ?  Comment: Occasional alcohol use; once or twice a year  ? Drug use: No  ? Sexual activity: Not on file  ?Other Topics Concern  ? Not on file  ?Social History Narrative  ? Not on file  ? ?Social Determinants of Health  ? ?Financial Resource Strain: Not on file  ?Food Insecurity: Not on file  ?Transportation Needs: Not on file  ?Physical Activity: Not on file  ?Stress: Not on file  ?Social Connections: Not on file  ?Intimate Partner Violence: Not on file  ? ? ?Outpatient Medications Prior to Visit  ?Medication Sig Dispense Refill  ? tirzepatide (MOUNJARO) 10 MG/0.5ML Pen Inject 1 pen (10 mg) into the skin once a week. 2 mL 0  ? Vitamin D, Ergocalciferol, (DRISDOL) 1.25 MG (50000 UNIT) CAPS capsule Take 1 capsule by mouth every 7 days. 12 capsule 0  ? ?No facility-administered medications prior to visit.  ? ? ?No Known Allergies ? ?Review of Systems  ?Constitutional:  Positive for fatigue. Negative for fever.  ?HENT:  Positive for congestion, postnasal drip, rhinorrhea, sore throat and voice change.   ?  Respiratory:  Positive for cough. Negative for shortness of breath.   ?Cardiovascular:  Negative for chest pain and leg swelling.  ?Gastrointestinal:  Negative for abdominal pain.  ?Neurological:  Negative for dizziness and headaches.  ? ?   ?Objective:  ?  ?Physical Exam ?Constitutional:   ?   General: She is awake.  ?   Appearance: She is well-developed.  ?HENT:  ?   Head: Normocephalic.  ?   Right Ear: Tympanic membrane normal.  ?   Left Ear: Tympanic membrane normal.  ?   Nose: Congestion present.  ?   Mouth/Throat:  ?   Mouth: Mucous membranes are moist.  ?   Pharynx: Uvula midline. Oropharyngeal exudate and posterior oropharyngeal erythema present. No pharyngeal swelling.  ?   Tonsils: No tonsillar exudate or tonsillar abscesses.  ?Eyes:  ?   Conjunctiva/sclera: Conjunctivae normal.  ?Cardiovascular:  ?   Rate and Rhythm: Normal rate and regular  rhythm.  ?   Heart sounds: Normal heart sounds.  ?Pulmonary:  ?   Effort: Pulmonary effort is normal. No respiratory distress.  ?   Breath sounds: Normal breath sounds. No stridor. No wheezing, rhonchi or rales.  ?Lymphadenopathy:  ?   Cervical: No cervical adenopathy.  ?Skin: ?   General: Skin is warm.  ?Neurological:  ?   Mental Status: She is alert and oriented to person, place, and time.  ?Psychiatric:     ?   Attention and Perception: Attention normal.     ?   Mood and Affect: Mood normal.     ?   Speech: Speech normal.     ?   Behavior: Behavior is cooperative.  ? ? ?LMP 11/16/2016 (Approximate)  ?Wt Readings from Last 3 Encounters:  ?09/21/21 161 lb (73 kg)  ?08/25/21 166 lb (75.3 kg)  ?08/18/21 169 lb 6.4 oz (76.8 kg)  ? ? ?Health Maintenance Due  ?Topic Date Due  ? Hepatitis C Screening  Never done  ? COVID-19 Vaccine (4 - Booster for Pfizer series) 08/29/2020  ? ? ?There are no preventive care reminders to display for this patient. ? ? ?Lab Results  ?Component Value Date  ? TSH 2.540 10/15/2020  ? ?Lab Results  ?Component Value Date  ? WBC 6.3 10/15/2020  ? HGB 14.1 10/15/2020  ? HCT 43.7 10/15/2020  ? MCV 91 10/15/2020  ? PLT 352 10/15/2020  ? ?Lab Results  ?Component Value Date  ? NA 142 03/31/2021  ? K 4.9 03/31/2021  ? CO2 23 03/31/2021  ? GLUCOSE 84 03/31/2021  ? BUN 18 03/31/2021  ? CREATININE 0.75 03/31/2021  ? BILITOT 0.4 03/31/2021  ? ALKPHOS 84 03/31/2021  ? AST 12 03/31/2021  ? ALT 17 03/31/2021  ? PROT 7.7 03/31/2021  ? ALBUMIN 4.5 03/31/2021  ? CALCIUM 9.7 03/31/2021  ? EGFR 94 03/31/2021  ? ?Lab Results  ?Component Value Date  ? CHOL 239 (H) 03/31/2021  ? ?Lab Results  ?Component Value Date  ? HDL 68 03/31/2021  ? ?Lab Results  ?Component Value Date  ? LDLCALC 158 (H) 03/31/2021  ? ?Lab Results  ?Component Value Date  ? TRIG 75 03/31/2021  ? ?Lab Results  ?Component Value Date  ? CHOLHDL 3.2 10/15/2020  ? ?Lab Results  ?Component Value Date  ? HGBA1C 5.3 03/31/2021  ? ? ?   ?Assessment &  Plan:  ? ?Strep pharyngitis ?Rx azelastine for nasal congestion, advised additional saline nasal rinses ?Recommended OTC antihistamine--zyrtec, claritin.  ?Poc strep test positive.  Amoxicillin 500 mg bid x 10 d ? ?Advised increase fluids, warm tea, honey. Advised if sensation of swollen throat progresses or difficulty swallowing to call office and can rx steroids. At this point I do not feel they are needed. ?Ok to continue otc alka seltzer cold.  ? ? ?I, Mikey Kirschner, PA-C have reviewed all documentation for this visit. The documentation on  09/24/2021  for the exam, diagnosis, procedures, and orders are all accurate and complete. ? ?Mikey Kirschner, PA-C ?[facility] [place] ?[id number] ?[place], [place], [address] ?Office: [phone] ?Fax: [phone]  ? ?

## 2021-10-01 ENCOUNTER — Telehealth (INDEPENDENT_AMBULATORY_CARE_PROVIDER_SITE_OTHER): Payer: Self-pay | Admitting: Bariatrics

## 2021-10-01 ENCOUNTER — Other Ambulatory Visit (HOSPITAL_COMMUNITY): Payer: Self-pay

## 2021-10-01 ENCOUNTER — Encounter (INDEPENDENT_AMBULATORY_CARE_PROVIDER_SITE_OTHER): Payer: Self-pay | Admitting: Bariatrics

## 2021-10-01 ENCOUNTER — Encounter (INDEPENDENT_AMBULATORY_CARE_PROVIDER_SITE_OTHER): Payer: Self-pay

## 2021-10-01 NOTE — Telephone Encounter (Signed)
Prior authorization denied for [facility]. Per insurance: Plan exclusion. Patient already uses copay card. Patient sent denial message via mychart.  ?

## 2021-10-01 NOTE — Telephone Encounter (Signed)
Dr.Brown 

## 2021-10-10 ENCOUNTER — Other Ambulatory Visit (HOSPITAL_COMMUNITY): Payer: Self-pay

## 2021-10-12 ENCOUNTER — Other Ambulatory Visit (HOSPITAL_COMMUNITY): Payer: Self-pay

## 2021-10-13 ENCOUNTER — Ambulatory Visit (INDEPENDENT_AMBULATORY_CARE_PROVIDER_SITE_OTHER): Payer: No Typology Code available for payment source | Admitting: Physician Assistant

## 2021-10-13 ENCOUNTER — Other Ambulatory Visit (HOSPITAL_COMMUNITY): Payer: Self-pay

## 2021-10-14 ENCOUNTER — Other Ambulatory Visit (HOSPITAL_COMMUNITY): Payer: Self-pay

## 2021-10-15 ENCOUNTER — Other Ambulatory Visit (HOSPITAL_COMMUNITY): Payer: Self-pay

## 2021-10-19 ENCOUNTER — Other Ambulatory Visit (INDEPENDENT_AMBULATORY_CARE_PROVIDER_SITE_OTHER): Payer: Self-pay | Admitting: Bariatrics

## 2021-10-19 ENCOUNTER — Encounter (INDEPENDENT_AMBULATORY_CARE_PROVIDER_SITE_OTHER): Payer: Self-pay | Admitting: Bariatrics

## 2021-10-19 ENCOUNTER — Ambulatory Visit (INDEPENDENT_AMBULATORY_CARE_PROVIDER_SITE_OTHER): Payer: No Typology Code available for payment source | Admitting: Bariatrics

## 2021-10-19 ENCOUNTER — Other Ambulatory Visit (HOSPITAL_COMMUNITY): Payer: Self-pay

## 2021-10-19 VITALS — BP 107/74 | HR 74 | Temp 98.4°F | Ht 62.0 in | Wt 154.0 lb

## 2021-10-19 DIAGNOSIS — E8881 Metabolic syndrome: Secondary | ICD-10-CM

## 2021-10-19 DIAGNOSIS — E559 Vitamin D deficiency, unspecified: Secondary | ICD-10-CM

## 2021-10-19 DIAGNOSIS — E669 Obesity, unspecified: Secondary | ICD-10-CM | POA: Diagnosis not present

## 2021-10-19 DIAGNOSIS — Z6828 Body mass index (BMI) 28.0-28.9, adult: Secondary | ICD-10-CM | POA: Diagnosis not present

## 2021-10-19 MED ORDER — VITAMIN D (ERGOCALCIFEROL) 1.25 MG (50000 UNIT) PO CAPS
50000.0000 [IU] | ORAL_CAPSULE | ORAL | 0 refills | Status: DC
  Filled 2021-10-19: qty 12, 84d supply, fill #0

## 2021-10-19 MED ORDER — WEGOVY 0.5 MG/0.5ML ~~LOC~~ SOAJ
0.5000 mg | SUBCUTANEOUS | 0 refills | Status: DC
Start: 2021-10-19 — End: 2021-10-22
  Filled 2021-10-19: qty 2, 28d supply, fill #0

## 2021-10-19 NOTE — Progress Notes (Signed)
? ? ? ?Chief Complaint:  ? ?OBESITY ?Andrea Alvarez is here to discuss her progress with her obesity treatment plan along with follow-up of her obesity related diagnoses. Andrea Alvarez is on the Category 1 Plan and states she is following her eating plan approximately 60% of the time. Andrea Alvarez states she is doing cardio for 20 minutes for 3 times per week and  piliates and bands for 5-10 minutes 5 times per week. ? ?Today's visit was #: 17 ?Starting weight: 197 lbs ?Starting date: 10/15/2020 ?Today's weight: 154 lbs ?Today's date: 10/19/2021 ?Total lbs lost to date: 43 lbs ?Total lbs lost since last in-office visit: 7 lbs ? ?Interim History: Andrea Alvarez is down 7 lbs over the last few weeks (decreased portions). She is doing well with water and protein. ? ?Subjective:  ? ?1. Vitamin D insufficiency ?Andrea Alvarez notes minimal sun exposure.  ? ?2. Insulin resistance ?Andrea Alvarez is currently taking Mounjaro and tolerating well (card will not pay).  ? ?Assessment/Plan:  ? ?1. Vitamin D insufficiency ?Low Vitamin D level contributes to fatigue and are associated with obesity, breast, and colon cancer. We will refill prescription Vitamin D 50,000 IU every week for 1 month with no refills and Shelley will follow-up for routine testing of Vitamin D, at least 2-3 times per year to avoid over-replacement. ? ?- Vitamin D, Ergocalciferol, (DRISDOL) 1.25 MG (50000 UNIT) CAPS capsule; Take 1 capsule by mouth every 7 days.  Dispense: 12 capsule; Refill: 0 ? ?2. Insulin resistance ?We will refill Wegovy 0.5 mg for 1 month with no refills. She changed from [facility] to [facility] 0.5 mg. Andrea Alvarez will continue to work on weight loss, exercise, and decreasing simple carbohydrates to help decrease the risk of diabetes. Andrea Alvarez agreed to follow-up with [place] as directed to closely monitor her progress. ? ?- Semaglutide-Weight Management (WEGOVY) 0.5 MG/0.5ML SOAJ; Inject 0.5 mg into the skin once a week.  Dispense: 2 mL; Refill: 0 ? ?3. Obesity with current BMI 28.2 ?Andrea Alvarez is  currently in the action stage of change. As such, her goal is to continue with weight loss efforts. She has agreed to the Category 1 Plan.  ? ?Andrea Alvarez will continue meal planning and she will continue intentional eating.  ? ?Exercise goals:  As is.  ? ?Behavioral modification strategies: increasing lean protein intake, decreasing simple carbohydrates, increasing vegetables, increasing water intake, decreasing eating out, no skipping meals, meal planning and cooking strategies, keeping healthy foods in the home, and planning for success. ? ?Andrea Alvarez has agreed to follow-up with our clinic in 4-5 weeks. She was informed of the importance of frequent follow-up visits to maximize her success with intensive lifestyle modifications for her multiple health conditions.  ? ?Objective:  ? ?Blood pressure 107/74, pulse 74, temperature 98.4 ?F (36.9 ?C), height 5\' 2"  (1.575 m), weight 154 lb (69.9 kg), last menstrual period 11/16/2016, SpO2 100 %. ?Body mass index is 28.17 kg/m?. ? ?General: Cooperative, alert, well developed, in no acute distress. ?HEENT: Conjunctivae and lids unremarkable. ?Cardiovascular: Regular rhythm.  ?Lungs: Normal work of breathing. ?Neurologic: No focal deficits.  ? ?Lab Results  ?Component Value Date  ? CREATININE 0.75 03/31/2021  ? BUN 18 03/31/2021  ? NA 142 03/31/2021  ? K 4.9 03/31/2021  ? CL 102 03/31/2021  ? CO2 23 03/31/2021  ? ?Lab Results  ?Component Value Date  ? ALT 17 03/31/2021  ? AST 12 03/31/2021  ? ALKPHOS 84 03/31/2021  ? BILITOT 0.4 03/31/2021  ? ?Lab Results  ?Component Value Date  ?  HGBA1C 5.3 03/31/2021  ? HGBA1C 5.2 07/04/2020  ? HGBA1C 5.3 07/02/2019  ? HGBA1C 5.4 08/28/2018  ? HGBA1C 5.2 05/16/2018  ? ?Lab Results  ?Component Value Date  ? INSULIN 10.8 03/31/2021  ? INSULIN 11.9 10/15/2020  ? INSULIN 13.8 08/28/2018  ? INSULIN 10.8 05/16/2018  ? ?Lab Results  ?Component Value Date  ? TSH 2.540 10/15/2020  ? ?Lab Results  ?Component Value Date  ? CHOL 239 (H) 03/31/2021  ? HDL 68  03/31/2021  ? LDLCALC 158 (H) 03/31/2021  ? TRIG 75 03/31/2021  ? CHOLHDL 3.2 10/15/2020  ? ?Lab Results  ?Component Value Date  ? VD25OH 51.6 03/31/2021  ? VD25OH 32.2 10/15/2020  ? VD25OH 42.2 08/28/2018  ? ?Lab Results  ?Component Value Date  ? WBC 6.3 10/15/2020  ? HGB 14.1 10/15/2020  ? HCT 43.7 10/15/2020  ? MCV 91 10/15/2020  ? PLT 352 10/15/2020  ? ?No results found for: IRON, TIBC, FERRITIN ? ?Attestation Statements:  ? ?Reviewed by clinician on day of visit: allergies, medications, problem list, medical history, surgical history, family history, social history, and previous encounter notes. ? ?I, Jackson Latino, RMA, am acting as transcriptionist for [employer], DO. ? ?I have reviewed the above documentation for accuracy and completeness, and I agree with the above. Corinna Capra, DO ? ?

## 2021-10-19 NOTE — Telephone Encounter (Signed)
Please advise 

## 2021-10-20 ENCOUNTER — Encounter (INDEPENDENT_AMBULATORY_CARE_PROVIDER_SITE_OTHER): Payer: Self-pay | Admitting: Bariatrics

## 2021-10-22 ENCOUNTER — Other Ambulatory Visit (INDEPENDENT_AMBULATORY_CARE_PROVIDER_SITE_OTHER): Payer: Self-pay | Admitting: Bariatrics

## 2021-10-22 ENCOUNTER — Encounter (INDEPENDENT_AMBULATORY_CARE_PROVIDER_SITE_OTHER): Payer: Self-pay | Admitting: Bariatrics

## 2021-10-22 ENCOUNTER — Other Ambulatory Visit (HOSPITAL_COMMUNITY): Payer: Self-pay

## 2021-10-22 DIAGNOSIS — E88819 Insulin resistance, unspecified: Secondary | ICD-10-CM

## 2021-10-22 DIAGNOSIS — E8881 Metabolic syndrome: Secondary | ICD-10-CM

## 2021-10-22 MED ORDER — TIRZEPATIDE 10 MG/0.5ML ~~LOC~~ SOAJ
10.0000 mg | SUBCUTANEOUS | 0 refills | Status: DC
  Filled 2021-10-22: qty 2, 28d supply, fill #0

## 2021-10-22 NOTE — Telephone Encounter (Signed)
Pt reports she has been taking, currently out and next dose due tomorrow

## 2021-10-22 NOTE — Telephone Encounter (Signed)
Please review

## 2021-10-23 ENCOUNTER — Other Ambulatory Visit (HOSPITAL_COMMUNITY): Payer: Self-pay

## 2021-11-04 ENCOUNTER — Telehealth (INDEPENDENT_AMBULATORY_CARE_PROVIDER_SITE_OTHER): Payer: Self-pay | Admitting: Bariatrics

## 2021-11-04 NOTE — Telephone Encounter (Signed)
Prior authorization denied for [facility]. Per insurance: plan exclusion/weight loss medications not covered. Patient already aware and patient notified provider.  ?

## 2021-11-16 ENCOUNTER — Other Ambulatory Visit (HOSPITAL_COMMUNITY): Payer: Self-pay

## 2021-11-16 ENCOUNTER — Ambulatory Visit (INDEPENDENT_AMBULATORY_CARE_PROVIDER_SITE_OTHER): Payer: No Typology Code available for payment source | Admitting: Bariatrics

## 2021-11-16 ENCOUNTER — Encounter (INDEPENDENT_AMBULATORY_CARE_PROVIDER_SITE_OTHER): Payer: Self-pay | Admitting: Bariatrics

## 2021-11-16 VITALS — BP 111/68 | HR 74 | Temp 97.7°F | Ht 62.0 in | Wt 157.0 lb

## 2021-11-16 DIAGNOSIS — E669 Obesity, unspecified: Secondary | ICD-10-CM | POA: Diagnosis not present

## 2021-11-16 DIAGNOSIS — Z6828 Body mass index (BMI) 28.0-28.9, adult: Secondary | ICD-10-CM | POA: Diagnosis not present

## 2021-11-16 DIAGNOSIS — E8881 Metabolic syndrome: Secondary | ICD-10-CM

## 2021-11-16 DIAGNOSIS — E559 Vitamin D deficiency, unspecified: Secondary | ICD-10-CM | POA: Diagnosis not present

## 2021-11-16 MED ORDER — TIRZEPATIDE 10 MG/0.5ML ~~LOC~~ SOAJ
10.0000 mg | SUBCUTANEOUS | 0 refills | Status: DC
  Filled 2021-11-16: qty 2, 28d supply, fill #0

## 2021-11-16 MED ORDER — VITAMIN D (ERGOCALCIFEROL) 1.25 MG (50000 UNIT) PO CAPS
50000.0000 [IU] | ORAL_CAPSULE | ORAL | 0 refills | Status: DC
  Filled 2021-11-16: qty 12, 84d supply, fill #0

## 2021-11-18 ENCOUNTER — Other Ambulatory Visit (HOSPITAL_COMMUNITY): Payer: Self-pay

## 2021-11-23 NOTE — Progress Notes (Signed)
? ? ? ?Chief Complaint:  ? ?OBESITY ?Andrea Alvarez is here to discuss her progress with her obesity treatment plan along with follow-up of her obesity related diagnoses. Andrea Alvarez is on the Category 1 Plan and states she is following her eating plan approximately 0% of the time. Andrea Alvarez states she is walking for 2-3 hours 3-4 times per week. ? ?Today's visit was #: 18 ?Starting weight: 197 lbs ?Starting date: 10/15/2020 ?Today's weight: 157 lbs ?Today's date: 11/16/2021 ?Total lbs lost to date: 40 lbs ?Total lbs lost since last in-office visit: 0 ? ?Interim History: Andrea Alvarez is up 3 lbs since her last visit but has done well overall. She went on vacation. She is doing well with her water and protein intake. ? ?Subjective:  ? ?1. Insulin resistance ?Andrea Alvarez is currently taking Mounjaro. She notes no side effects.  ? ?2. Vitamin D insufficiency ?Andrea Alvarez is taking Vitamin D and she is taking Vitamin D as directed.  ? ?Assessment/Plan:  ? ?1. Insulin resistance ?Gibson will continue to work on weight loss, exercise, and decreasing simple carbohydrates to help decrease the risk of diabetes. We will refill Mounjaro 10 mg for 1 month with no refills. Elliana agreed to follow-up with [place] as directed to closely monitor her progress. ? ?- tirzepatide (MOUNJARO) 10 MG/0.5ML Pen; Inject 1 pen (10 mg) into the skin once a week.  Dispense: 2 mL; Refill: 0 ? ?2. Vitamin D insufficiency ?Low Vitamin D level contributes to fatigue and are associated with obesity, breast, and colon cancer. We will refill prescription Vitamin D 50,000 IU every week for 1 month with no refills and Joycelin will follow-up for routine testing of Vitamin D, at least 2-3 times per year to avoid over-replacement. ? ?- Vitamin D, Ergocalciferol, (DRISDOL) 1.25 MG (50000 UNIT) CAPS capsule; Take 1 capsule by mouth every 7 days.  Dispense: 12 capsule; Refill: 0 ? ?3. Obesity, current BMI 28.8 ?[name] is currently in the action stage of change. As such, her goal is to continue with  weight loss efforts. She has agreed to the Category 1 Plan.  ? ?Andrea Alvarez will continue meal planning and she will continue intentional eating.  ? ?Exercise goals:  Andrea Alvarez will walk more and she will continue going to the gym.  ? ?Behavioral modification strategies: increasing lean protein intake, decreasing simple carbohydrates, increasing vegetables, increasing water intake, decreasing eating out, no skipping meals, meal planning and cooking strategies, keeping healthy foods in the home, and planning for success. ? ?Andrea Alvarez has agreed to follow-up with our clinic in 4 weeks with nurse practitioner and 8 weeks with myself. She was informed of the importance of frequent follow-up visits to maximize her success with intensive lifestyle modifications for her multiple health conditions.  ? ?Objective:  ? ?Blood pressure 111/68, pulse 74, temperature 97.7 ?F (36.5 ?C), height 5\' 2"  (1.575 m), weight 157 lb (71.2 kg), last menstrual period 11/16/2016, SpO2 100 %. ?Body mass index is 28.72 kg/m?. ? ?General: Cooperative, alert, well developed, in no acute distress. ?HEENT: Conjunctivae and lids unremarkable. ?Cardiovascular: Regular rhythm.  ?Lungs: Normal work of breathing. ?Neurologic: No focal deficits.  ? ?Lab Results  ?Component Value Date  ? CREATININE 0.75 03/31/2021  ? BUN 18 03/31/2021  ? NA 142 03/31/2021  ? K 4.9 03/31/2021  ? CL 102 03/31/2021  ? CO2 23 03/31/2021  ? ?Lab Results  ?Component Value Date  ? ALT 17 03/31/2021  ? AST 12 03/31/2021  ? ALKPHOS 84 03/31/2021  ? BILITOT 0.4 03/31/2021  ? ?  Lab Results  ?Component Value Date  ? HGBA1C 5.3 03/31/2021  ? HGBA1C 5.2 07/04/2020  ? HGBA1C 5.3 07/02/2019  ? HGBA1C 5.4 08/28/2018  ? HGBA1C 5.2 05/16/2018  ? ?Lab Results  ?Component Value Date  ? INSULIN 10.8 03/31/2021  ? INSULIN 11.9 10/15/2020  ? INSULIN 13.8 08/28/2018  ? INSULIN 10.8 05/16/2018  ? ?Lab Results  ?Component Value Date  ? TSH 2.540 10/15/2020  ? ?Lab Results  ?Component Value Date  ? CHOL 239 (H)  03/31/2021  ? HDL 68 03/31/2021  ? LDLCALC 158 (H) 03/31/2021  ? TRIG 75 03/31/2021  ? CHOLHDL 3.2 10/15/2020  ? ?Lab Results  ?Component Value Date  ? VD25OH 51.6 03/31/2021  ? VD25OH 32.2 10/15/2020  ? VD25OH 42.2 08/28/2018  ? ?Lab Results  ?Component Value Date  ? WBC 6.3 10/15/2020  ? HGB 14.1 10/15/2020  ? HCT 43.7 10/15/2020  ? MCV 91 10/15/2020  ? PLT 352 10/15/2020  ? ?No results found for: IRON, TIBC, FERRITIN ? ?Attestation Statements:  ? ?Reviewed by clinician on day of visit: allergies, medications, problem list, medical history, surgical history, family history, social history, and previous encounter notes. ? ?I, Jackson Latino, RMA, am acting as transcriptionist for [employer], DO. ? ?I have reviewed the above documentation for accuracy and completeness, and I agree with the above. Corinna Capra, DO  ?

## 2021-11-24 ENCOUNTER — Other Ambulatory Visit (HOSPITAL_COMMUNITY): Payer: Self-pay

## 2021-11-24 ENCOUNTER — Encounter (INDEPENDENT_AMBULATORY_CARE_PROVIDER_SITE_OTHER): Payer: Self-pay | Admitting: Bariatrics

## 2021-11-24 NOTE — Telephone Encounter (Addendum)
Prior authorization was done for [facility] in March 2023. Patient was denied on 09/30/21 for [facility] with dx: insulin resistance E88.81. Per patient insurance: medication is not covered/plan exclusion due to cost. Copy of denial in patient chart, dated 09/30/21. Mostly all insurances now do not cover this medication if patient does not have type 2 diabetes.  ?

## 2021-11-24 NOTE — Telephone Encounter (Signed)
Can you please advise?

## 2021-11-26 ENCOUNTER — Other Ambulatory Visit (HOSPITAL_COMMUNITY): Payer: Self-pay

## 2021-11-28 ENCOUNTER — Other Ambulatory Visit (HOSPITAL_COMMUNITY): Payer: Self-pay

## 2021-12-02 ENCOUNTER — Other Ambulatory Visit (HOSPITAL_COMMUNITY): Payer: Self-pay

## 2021-12-04 ENCOUNTER — Other Ambulatory Visit (HOSPITAL_COMMUNITY): Payer: Self-pay

## 2021-12-07 ENCOUNTER — Other Ambulatory Visit (HOSPITAL_COMMUNITY): Payer: Self-pay

## 2021-12-15 ENCOUNTER — Ambulatory Visit (INDEPENDENT_AMBULATORY_CARE_PROVIDER_SITE_OTHER): Payer: No Typology Code available for payment source | Admitting: Adult Health

## 2021-12-15 ENCOUNTER — Encounter (INDEPENDENT_AMBULATORY_CARE_PROVIDER_SITE_OTHER): Payer: Self-pay | Admitting: Adult Health

## 2021-12-15 ENCOUNTER — Other Ambulatory Visit (HOSPITAL_COMMUNITY): Payer: Self-pay

## 2021-12-15 VITALS — BP 111/71 | HR 76 | Temp 98.1°F | Ht 62.0 in | Wt 156.0 lb

## 2021-12-15 DIAGNOSIS — Z6828 Body mass index (BMI) 28.0-28.9, adult: Secondary | ICD-10-CM | POA: Diagnosis not present

## 2021-12-15 DIAGNOSIS — E8881 Metabolic syndrome: Secondary | ICD-10-CM | POA: Diagnosis not present

## 2021-12-15 DIAGNOSIS — E669 Obesity, unspecified: Secondary | ICD-10-CM

## 2021-12-15 MED ORDER — SAXENDA 18 MG/3ML ~~LOC~~ SOPN
PEN_INJECTOR | SUBCUTANEOUS | 0 refills | Status: DC
  Filled 2021-12-15: qty 6, 30d supply, fill #0

## 2021-12-15 NOTE — Progress Notes (Signed)
Chief Complaint:   OBESITY Andrea Alvarez is here to discuss her progress with her obesity treatment plan along with follow-up of her obesity related diagnoses. Andrea Alvarez is on the Category 1 Plan and states she is following her eating plan approximately 45% of the time. Andrea Alvarez states she is walking 40 minutes 4 times per week.  Today's visit was #: 19 Starting weight: 197 lbs Starting date: 10/15/2020 Today's weight: 156 lbs Today's date: 12/15/2021 Total lbs lost to date: 41 lbs Total lbs lost since last in-office visit: 1 lb  Interim History:  04/2021-Ozempic 0.5 mg therapy replaced by [facility] therapy. She eventually titrated up Mounjaro to 10 mg. She is not diabetic was unable to obtain last refill of Mounjaro.  Last dose of Mounjaor 10 mg 11/20/2021.   She used her left over Ozempic 0.5 mg on 11/27/2021 and 12/04/2021.   She reports losing 20 lbs prior to starting HWW without aid of any injectable therapy- proper nutrition and walking.  Subjective:   1. Insulin resistance 04/2021-Ozempic 0.5 mg therapy replaced by [facility] therapy. She eventually titrated up Mounjaro to 10 mg. She is not diabetic was unable to obtain last refill of Mounjaro.  Last dose of Mounjaor 10 mg 11/20/2021.   She used her left over Ozempic 0.5 mg on 11/27/2021 and 12/04/2021.   Assessment/Plan:   1. Insulin resistance Remain off Mounjaro and Ozempic.   Start [place] as directed.   2. Obesity, current BMI 28.6 Start Saxenda 0.6 mg daily for 2 weeks, then increase to 1.2 mg daily, then hold at this dose until next OV.  Andrea Alvarez is currently in the action stage of change. As such, her goal is to continue with weight loss efforts. She has agreed to the Category 1 Plan.   Exercise goals:  As is.   Behavioral modification strategies: increasing lean protein intake, decreasing simple carbohydrates, increasing water intake, decreasing sodium intake, no skipping meals, meal planning and cooking strategies,  keeping healthy foods in the home, and planning for success.  Andrea Alvarez has agreed to follow-up with our clinic in 5 weeks. She was informed of the importance of frequent follow-up visits to maximize her success with intensive lifestyle modifications for her multiple health conditions.   Objective:   Blood pressure 111/71, pulse 76, temperature 98.1 F (36.7 C), height 5\' 2"  (1.575 m), weight 156 lb (70.8 kg), last menstrual period 11/16/2016, SpO2 91 %. Body mass index is 28.53 kg/m.  General: Cooperative, alert, well developed, in no acute distress. HEENT: Conjunctivae and lids unremarkable. Cardiovascular: Regular rhythm.  Lungs: Normal work of breathing. Neurologic: No focal deficits.   Lab Results  Component Value Date   CREATININE 0.75 03/31/2021   BUN 18 03/31/2021   NA 142 03/31/2021   K 4.9 03/31/2021   CL 102 03/31/2021   CO2 23 03/31/2021   Lab Results  Component Value Date   ALT 17 03/31/2021   AST 12 03/31/2021   ALKPHOS 84 03/31/2021   BILITOT 0.4 03/31/2021   Lab Results  Component Value Date   HGBA1C 5.3 03/31/2021   HGBA1C 5.2 07/04/2020   HGBA1C 5.3 07/02/2019   HGBA1C 5.4 08/28/2018   HGBA1C 5.2 05/16/2018   Lab Results  Component Value Date   INSULIN 10.8 03/31/2021   INSULIN 11.9 10/15/2020   INSULIN 13.8 08/28/2018   INSULIN 10.8 05/16/2018   Lab Results  Component Value Date   TSH 2.540 10/15/2020   Lab Results  Component Value Date   CHOL  239 (H) 03/31/2021   HDL 68 03/31/2021   LDLCALC 158 (H) 03/31/2021   TRIG 75 03/31/2021   CHOLHDL 3.2 10/15/2020   Lab Results  Component Value Date   VD25OH 51.6 03/31/2021   VD25OH 32.2 10/15/2020   VD25OH 42.2 08/28/2018   Lab Results  Component Value Date   WBC 6.3 10/15/2020   HGB 14.1 10/15/2020   HCT 43.7 10/15/2020   MCV 91 10/15/2020   PLT 352 10/15/2020   No results found for: IRON, TIBC, FERRITIN   Attestation Statements:   Reviewed by clinician on day of visit:  allergies, medications, problem list, medical history, surgical history, family history, social history, and previous encounter notes.  I, Malcolm Metro, RMA, am acting as Energy manager for William Hamburger, NP.  I have reviewed the above documentation for accuracy and completeness, and I agree with the above. -  Karsyn Jamie d. Farrel Guimond, NP-C

## 2021-12-16 ENCOUNTER — Other Ambulatory Visit (HOSPITAL_COMMUNITY): Payer: Self-pay

## 2021-12-17 ENCOUNTER — Encounter (INDEPENDENT_AMBULATORY_CARE_PROVIDER_SITE_OTHER): Payer: Self-pay | Admitting: Adult Health

## 2021-12-17 ENCOUNTER — Other Ambulatory Visit (HOSPITAL_COMMUNITY): Payer: Self-pay

## 2021-12-17 NOTE — Telephone Encounter (Signed)
PA needed for Saxenda.

## 2021-12-17 NOTE — Telephone Encounter (Signed)
Prior authorization started for Saxenda. Will notify patient and provider once response is received. I don't believe this medication will be covered, due to previous denial of Wegovy.  FYI - patient was recently denied for [facility]. Per insurance: plan exclusion/weight loss medications not covered. Thanks!

## 2021-12-21 ENCOUNTER — Other Ambulatory Visit (HOSPITAL_COMMUNITY): Payer: Self-pay

## 2021-12-23 NOTE — Telephone Encounter (Signed)
Pt calling stated we denied her prescription for Saxenda after her last visit but could not tell her why it got denied. The best pharmacy to send the medication to is [facility] (phone [phone]) and the best fax number to send to rx to is [phone].   Last appt 5/30 with Danford

## 2021-12-30 ENCOUNTER — Encounter (INDEPENDENT_AMBULATORY_CARE_PROVIDER_SITE_OTHER): Payer: Self-pay

## 2021-12-30 ENCOUNTER — Telehealth (INDEPENDENT_AMBULATORY_CARE_PROVIDER_SITE_OTHER): Payer: Self-pay | Admitting: Adult Health

## 2021-12-30 NOTE — Telephone Encounter (Signed)
Andrea Alvarez - Prior authorization denied for Saxenda. Per insurance: not a covered benefit/plan exclusion. Patient sent denial message via mychart.

## 2022-01-06 ENCOUNTER — Other Ambulatory Visit (HOSPITAL_COMMUNITY): Payer: Self-pay

## 2022-01-12 ENCOUNTER — Ambulatory Visit (INDEPENDENT_AMBULATORY_CARE_PROVIDER_SITE_OTHER): Payer: No Typology Code available for payment source | Admitting: Bariatrics

## 2022-01-20 ENCOUNTER — Ambulatory Visit (INDEPENDENT_AMBULATORY_CARE_PROVIDER_SITE_OTHER): Payer: BC Managed Care – PPO | Admitting: Bariatrics

## 2022-01-20 ENCOUNTER — Encounter (INDEPENDENT_AMBULATORY_CARE_PROVIDER_SITE_OTHER): Payer: Self-pay | Admitting: Bariatrics

## 2022-01-20 ENCOUNTER — Other Ambulatory Visit (HOSPITAL_COMMUNITY): Payer: Self-pay

## 2022-01-20 VITALS — BP 118/76 | HR 67 | Temp 97.8°F | Ht 62.0 in | Wt 163.0 lb

## 2022-01-20 DIAGNOSIS — E669 Obesity, unspecified: Secondary | ICD-10-CM | POA: Diagnosis not present

## 2022-01-20 DIAGNOSIS — Z6829 Body mass index (BMI) 29.0-29.9, adult: Secondary | ICD-10-CM

## 2022-01-20 DIAGNOSIS — I10 Essential (primary) hypertension: Secondary | ICD-10-CM | POA: Diagnosis not present

## 2022-01-20 DIAGNOSIS — E559 Vitamin D deficiency, unspecified: Secondary | ICD-10-CM

## 2022-01-20 MED ORDER — SAXENDA 18 MG/3ML ~~LOC~~ SOPN
PEN_INJECTOR | SUBCUTANEOUS | 0 refills | Status: DC
  Filled 2022-01-20: qty 6, 56d supply, fill #0

## 2022-01-20 MED ORDER — VITAMIN D (ERGOCALCIFEROL) 1.25 MG (50000 UNIT) PO CAPS
50000.0000 [IU] | ORAL_CAPSULE | ORAL | 0 refills | Status: DC
  Filled 2022-01-20 (×2): qty 12, 84d supply, fill #0

## 2022-01-21 ENCOUNTER — Encounter (INDEPENDENT_AMBULATORY_CARE_PROVIDER_SITE_OTHER): Payer: Self-pay

## 2022-01-21 ENCOUNTER — Telehealth (INDEPENDENT_AMBULATORY_CARE_PROVIDER_SITE_OTHER): Payer: Self-pay | Admitting: Bariatrics

## 2022-01-21 NOTE — Progress Notes (Signed)
Chief Complaint:   OBESITY Andrea Alvarez is here to discuss her progress with her obesity treatment plan along with follow-up of her obesity related diagnoses. Andrea Alvarez is on the Category 1 Plan and states she is following her eating plan approximately 60% of the time. Andrea Alvarez states she is walking for 30-45 minutes 3 times per week.  Today's visit was #: 20 Starting weight: 197 lbs Starting date: 10/15/2020 Today's weight: 163 lbs Today's date: 01/20/2022 Total lbs lost to date: 34 Total lbs lost since last in-office visit: 0  Interim History: Andrea Alvarez is up 7 lbs since her last visit, but she has done well overall.  She is taking [place].  She has not been able to get the insurance to cover her prescription.  Subjective:   1. Vitamin D insufficiency Andrea Alvarez is taking vitamin D as directed.  2. Essential hypertension, Benign Andrea Alvarez is not on medications currently.  Assessment/Plan:   1. Vitamin D insufficiency Andrea Alvarez will continue prescription vitamin D 50,000 units once weekly, and we will refill for 90 days.  - Vitamin D, Ergocalciferol, (DRISDOL) 1.25 MG (50000 UNIT) CAPS capsule; Take 1 capsule by mouth every 7 days.  Dispense: 12 capsule; Refill: 0  2. Essential hypertension, Benign Andrea Alvarez will continue working on her diet and exercise to improve blood pressure control. She is to not have added salt. We will watch for signs of hypotension as she continues her lifestyle modifications.  3. Obesity, current BMI 29.8 Andrea Alvarez is currently in the action stage of change. As such, her goal is to continue with weight loss efforts. She has agreed to the Category 1 Plan.   Meal planning and intentional eating were discussed.  She will keep her protein and water intake high.  We discussed various medication options to help Andrea Alvarez with her weight loss efforts and we both agreed to continue Saxenda, and we will refill for 1 month.  - Liraglutide -Weight Management (SAXENDA) 18 MG/3ML SOPN;  0.6mg  daily or 2 weeks, then increase to 1.2mg  daily and hold at this dose  Dispense: 6 mL; Refill: 0  Exercise goals: As is.   Behavioral modification strategies: increasing lean protein intake, decreasing simple carbohydrates, increasing vegetables, increasing water intake, decreasing eating out, no skipping meals, meal planning and cooking strategies, keeping healthy foods in the home, and planning for success.  Andrea Alvarez has agreed to follow-up with our clinic in 4 weeks. She was informed of the importance of frequent follow-up visits to maximize her success with intensive lifestyle modifications for her multiple health conditions.   Objective:   Blood pressure 118/76, pulse 67, temperature 97.8 F (36.6 C), height 5\' 2"  (1.575 m), weight 163 lb (73.9 kg), last menstrual period 11/16/2016, SpO2 97 %. Body mass index is 29.81 kg/m.  General: Cooperative, alert, well developed, in no acute distress. HEENT: Conjunctivae and lids unremarkable. Cardiovascular: Regular rhythm.  Lungs: Normal work of breathing. Neurologic: No focal deficits.   Lab Results  Component Value Date   CREATININE 0.75 03/31/2021   BUN 18 03/31/2021   NA 142 03/31/2021   K 4.9 03/31/2021   CL 102 03/31/2021   CO2 23 03/31/2021   Lab Results  Component Value Date   ALT 17 03/31/2021   AST 12 03/31/2021   ALKPHOS 84 03/31/2021   BILITOT 0.4 03/31/2021   Lab Results  Component Value Date   HGBA1C 5.3 03/31/2021   HGBA1C 5.2 07/04/2020   HGBA1C 5.3 07/02/2019   HGBA1C 5.4 08/28/2018   HGBA1C  5.2 05/16/2018   Lab Results  Component Value Date   INSULIN 10.8 03/31/2021   INSULIN 11.9 10/15/2020   INSULIN 13.8 08/28/2018   INSULIN 10.8 05/16/2018   Lab Results  Component Value Date   TSH 2.540 10/15/2020   Lab Results  Component Value Date   CHOL 239 (H) 03/31/2021   HDL 68 03/31/2021   LDLCALC 158 (H) 03/31/2021   TRIG 75 03/31/2021   CHOLHDL 3.2 10/15/2020   Lab Results  Component Value  Date   VD25OH 51.6 03/31/2021   VD25OH 32.2 10/15/2020   VD25OH 42.2 08/28/2018   Lab Results  Component Value Date   WBC 6.3 10/15/2020   HGB 14.1 10/15/2020   HCT 43.7 10/15/2020   MCV 91 10/15/2020   PLT 352 10/15/2020   No results found for: "IRON", "TIBC", "FERRITIN"  Attestation Statements:   Reviewed by clinician on day of visit: allergies, medications, problem list, medical history, surgical history, family history, social history, and previous encounter notes.   Trude Mcburney, am acting as Energy manager for [employer], DO.  I have reviewed the above documentation for accuracy and completeness, and I agree with the above. Corinna Capra, DO

## 2022-01-21 NOTE — Telephone Encounter (Signed)
Dr. Manson Passey - Patient was seen in office on 01/20/2022. Patient updated new insurance information effective 01/16/2022 and copy of new insurance card was scanned into system. New prior authorization request was started for Saxenda. PA was not able to be done in covermymeds and I called patient pharmacy benefits provider [phone]), per insurance Bernie Covey is plan exclusion/not a covered benefit. Reference # [id number]. Patient sent mychart message that Bernie Covey is not covered/plan exclusion.

## 2022-01-25 ENCOUNTER — Encounter (INDEPENDENT_AMBULATORY_CARE_PROVIDER_SITE_OTHER): Payer: Self-pay | Admitting: Bariatrics

## 2022-01-26 ENCOUNTER — Telehealth (INDEPENDENT_AMBULATORY_CARE_PROVIDER_SITE_OTHER): Payer: Self-pay | Admitting: Bariatrics

## 2022-01-26 NOTE — Telephone Encounter (Signed)
Left message for patient to return call.

## 2022-01-26 NOTE — Telephone Encounter (Signed)
Pt's pharmacy called stating that they have been awaiting pt's rx for Saxenda.  Please call [facility] at [phone].

## 2022-01-26 NOTE — Telephone Encounter (Signed)
Notified patient that Andrea Alvarez is a policy plan exclusion. Patient verbalized understanding.

## 2022-01-29 ENCOUNTER — Other Ambulatory Visit (HOSPITAL_COMMUNITY): Payer: Self-pay

## 2022-02-22 ENCOUNTER — Ambulatory Visit (INDEPENDENT_AMBULATORY_CARE_PROVIDER_SITE_OTHER): Payer: BC Managed Care – PPO | Admitting: Bariatrics

## 2022-02-22 ENCOUNTER — Other Ambulatory Visit (HOSPITAL_COMMUNITY): Payer: Self-pay

## 2022-02-22 ENCOUNTER — Encounter (INDEPENDENT_AMBULATORY_CARE_PROVIDER_SITE_OTHER): Payer: Self-pay | Admitting: Bariatrics

## 2022-02-22 VITALS — BP 118/75 | HR 77 | Temp 97.9°F | Ht 62.0 in | Wt 163.0 lb

## 2022-02-22 DIAGNOSIS — R632 Polyphagia: Secondary | ICD-10-CM

## 2022-02-22 DIAGNOSIS — Z6830 Body mass index (BMI) 30.0-30.9, adult: Secondary | ICD-10-CM

## 2022-02-22 DIAGNOSIS — E669 Obesity, unspecified: Secondary | ICD-10-CM

## 2022-02-22 DIAGNOSIS — E8881 Metabolic syndrome: Secondary | ICD-10-CM | POA: Diagnosis not present

## 2022-02-22 DIAGNOSIS — E559 Vitamin D deficiency, unspecified: Secondary | ICD-10-CM

## 2022-02-22 MED ORDER — METFORMIN HCL 500 MG PO TABS
500.0000 mg | ORAL_TABLET | Freq: Two times a day (BID) | ORAL | 0 refills | Status: DC
  Filled 2022-02-22: qty 60, 30d supply, fill #0

## 2022-02-23 ENCOUNTER — Other Ambulatory Visit (HOSPITAL_COMMUNITY): Payer: Self-pay

## 2022-02-24 ENCOUNTER — Encounter (INDEPENDENT_AMBULATORY_CARE_PROVIDER_SITE_OTHER): Payer: Self-pay

## 2022-03-02 ENCOUNTER — Encounter (INDEPENDENT_AMBULATORY_CARE_PROVIDER_SITE_OTHER): Payer: Self-pay | Admitting: Bariatrics

## 2022-03-02 NOTE — Progress Notes (Signed)
Chief Complaint:   OBESITY Andrea Alvarez is here to discuss her progress with her obesity treatment plan along with follow-up of her obesity related diagnoses. Andrea Alvarez is on the Category 1 Plan and states she is following her eating plan approximately 10% of the time. Andrea Alvarez states she is walking in the park for 45 minutes 3 times per week.  Today's visit was #: 21 Starting weight: 197 lbs Starting date: 10/15/2020 Today's weight: 163 lbs Today's date: 02/22/2022 Total lbs lost to date: 34 Total lbs lost since last in-office visit: 0  Interim History: Andrea Alvarez's weight remains the same. She is getting in adequate water and protein with each meal.   Subjective:   1. Polyphagia Andrea Alvarez was prescribed Saxenda but her insurance will not pay for the medication. She has tried metformin, Topamax, and phentermine.   2. Vitamin D insufficiency Andrea Alvarez is taking Vitamin D.   3. Insulin resistance Andrea Alvarez is not on medications currently.   Assessment/Plan:   1. Andrea Alvarez Face agreed to start metformin 500 mg BID with meals, with no refills. She will increase her water and protein intake.  - metFORMIN (GLUCOPHAGE) 500 MG tablet; Take 1 tablet (500 mg total) by mouth 2 (two) times daily with a meal.  Dispense: 60 tablet; Refill: 0  2. Vitamin D insufficiency Andrea Alvarez will continue Vitamin D as directed.   3. Insulin resistance Andrea Alvarez will minimize her carbohydrates (sugar and starches).   4. Obesity, current BMI 30.0 Andrea Alvarez is currently in the action stage of change. As such, her goal is to continue with weight loss efforts. She has agreed to the Category 1 Plan.   She will adhere closely to the plan. Mindful eating was discussed.   Exercise goals: As is.   Behavioral modification strategies: increasing lean protein intake, decreasing simple carbohydrates, increasing vegetables, increasing water intake, decreasing eating out, no skipping meals, meal planning and cooking strategies, keeping  healthy foods in the home, and planning for success.  Andrea Alvarez has agreed to follow-up with our clinic in 4 weeks. She was informed of the importance of frequent follow-up visits to maximize her success with intensive lifestyle modifications for her multiple health conditions.   Objective:   Blood pressure 118/75, pulse 77, temperature 97.9 F (36.6 C), height 5\' 2"  (1.575 m), weight 163 lb (73.9 kg), last menstrual period 11/16/2016, SpO2 98 %. Body mass index is 29.81 kg/m.  General: Cooperative, alert, well developed, in no acute distress. HEENT: Conjunctivae and lids unremarkable. Cardiovascular: Regular rhythm.  Lungs: Normal work of breathing. Neurologic: No focal deficits.   Lab Results  Component Value Date   CREATININE 0.75 03/31/2021   BUN 18 03/31/2021   NA 142 03/31/2021   K 4.9 03/31/2021   CL 102 03/31/2021   CO2 23 03/31/2021   Lab Results  Component Value Date   ALT 17 03/31/2021   AST 12 03/31/2021   ALKPHOS 84 03/31/2021   BILITOT 0.4 03/31/2021   Lab Results  Component Value Date   HGBA1C 5.3 03/31/2021   HGBA1C 5.2 07/04/2020   HGBA1C 5.3 07/02/2019   HGBA1C 5.4 08/28/2018   HGBA1C 5.2 05/16/2018   Lab Results  Component Value Date   INSULIN 10.8 03/31/2021   INSULIN 11.9 10/15/2020   INSULIN 13.8 08/28/2018   INSULIN 10.8 05/16/2018   Lab Results  Component Value Date   TSH 2.540 10/15/2020   Lab Results  Component Value Date   CHOL 239 (H) 03/31/2021   HDL 68 03/31/2021  LDLCALC 158 (H) 03/31/2021   TRIG 75 03/31/2021   CHOLHDL 3.2 10/15/2020   Lab Results  Component Value Date   VD25OH 51.6 03/31/2021   VD25OH 32.2 10/15/2020   VD25OH 42.2 08/28/2018   Lab Results  Component Value Date   WBC 6.3 10/15/2020   HGB 14.1 10/15/2020   HCT 43.7 10/15/2020   MCV 91 10/15/2020   PLT 352 10/15/2020   No results found for: "IRON", "TIBC", "FERRITIN"  Attestation Statements:   Reviewed by clinician on day of visit: allergies,  medications, problem list, medical history, surgical history, family history, social history, and previous encounter notes.   Trude Mcburney, am acting as Energy manager for [employer], DO.  I have reviewed the above documentation for accuracy and completeness, and I agree with the above. Corinna Capra, DO

## 2022-03-16 ENCOUNTER — Telehealth (INDEPENDENT_AMBULATORY_CARE_PROVIDER_SITE_OTHER): Payer: Self-pay | Admitting: Bariatrics

## 2022-03-16 NOTE — Telephone Encounter (Signed)
Waiting on return call.

## 2022-03-16 NOTE — Telephone Encounter (Signed)
Melissa from [facility] ([facility] called to get a update prescription-90 days for Gypsy [place]ore for [place]. The fax number is [phone] E-Scribe#  [id number]. The contact number is [phone].

## 2022-03-18 NOTE — Telephone Encounter (Signed)
Spoke to [place] and notified her based on Dr. Theora Gianotti last note that Bernie Covey was d/c'd due to insurance not covering medication. Melissa verbalized understanding.

## 2022-03-23 ENCOUNTER — Other Ambulatory Visit (INDEPENDENT_AMBULATORY_CARE_PROVIDER_SITE_OTHER): Payer: Self-pay | Admitting: Bariatrics

## 2022-03-23 DIAGNOSIS — E559 Vitamin D deficiency, unspecified: Secondary | ICD-10-CM

## 2022-03-23 DIAGNOSIS — R632 Polyphagia: Secondary | ICD-10-CM

## 2022-03-25 ENCOUNTER — Other Ambulatory Visit (HOSPITAL_COMMUNITY): Payer: Self-pay

## 2022-03-29 ENCOUNTER — Ambulatory Visit: Payer: Self-pay | Admitting: Bariatrics

## 2022-04-19 ENCOUNTER — Ambulatory Visit (INDEPENDENT_AMBULATORY_CARE_PROVIDER_SITE_OTHER): Payer: Commercial Managed Care - PPO | Admitting: Nurse Practitioner

## 2022-04-19 ENCOUNTER — Encounter: Payer: Self-pay | Admitting: Nurse Practitioner

## 2022-04-19 VITALS — BP 132/83 | HR 68 | Temp 97.6°F | Resp 16 | Ht 62.0 in | Wt 170.0 lb

## 2022-04-19 DIAGNOSIS — E7849 Other hyperlipidemia: Secondary | ICD-10-CM

## 2022-04-19 DIAGNOSIS — E669 Obesity, unspecified: Secondary | ICD-10-CM

## 2022-04-19 DIAGNOSIS — E559 Vitamin D deficiency, unspecified: Secondary | ICD-10-CM

## 2022-04-19 DIAGNOSIS — E88819 Insulin resistance, unspecified: Secondary | ICD-10-CM

## 2022-04-19 DIAGNOSIS — Z6831 Body mass index (BMI) 31.0-31.9, adult: Secondary | ICD-10-CM

## 2022-04-19 DIAGNOSIS — R632 Polyphagia: Secondary | ICD-10-CM | POA: Diagnosis not present

## 2022-04-19 DIAGNOSIS — R7989 Other specified abnormal findings of blood chemistry: Secondary | ICD-10-CM | POA: Diagnosis not present

## 2022-04-19 MED ORDER — QSYMIA 7.5-46 MG PO CP24: 1.0000 | ORAL_CAPSULE | Freq: Every day | ORAL | 0 refills | Status: DC

## 2022-04-19 MED ORDER — QSYMIA 3.75-23 MG PO CP24: 1.0000 | ORAL_CAPSULE | Freq: Every day | ORAL | 0 refills | Status: DC

## 2022-04-19 NOTE — Patient Instructions (Signed)
What is Qsymia and how does it work?  Qsymia is a prescription only medicine to help with your weight loss. It is a combination of two medicines that are low dose, long-acting: Phentermine & Topiramate. Qsymia contains low dose Phentermine which is a stimulant medicine that could affect your heart rate and blood pressure Qsymia is designed to help you feel satisfied faster that will help you to decrease portion size. Also, it helps curb late night snacking habits. Some food you usually enjoy may start to taste differently which will help you make healthier food choices.  This medicine will be most effective when combined with a reduced calorie diet and physical activity.  How should I take Qsymia? Take daily in the morning with breakfast. Swallow the extended-release capsule whole. Do not crush, break, or chew it.  If you miss a dose, take it as soon as possible. If it is after 12pm, skip the missed dose and go back to your regular dosing schedule. Do not take extra medicine to make up for the missed dose. You have received two separate prescriptions today. You will initially take a lower dose of 3.75mg/23mg for 14 days then increase to a higher dose of 7.5mg/46mg for maintenance for 30 days. There are 4 total dosing options. Your provider will discuss any need to go up to a higher dose during your office visits.  If you are taking Levothyroxine, take the Levothyroxine 1 hours before breakfast and the take the Qsymia 1 hour after breakfast. Do not stop taking Qsymia without talking to your provider. Stopping Qsymia suddenly can cause serious side effects, such as seizures and headaches.   What should I avoid while taking Qsymia? Limit caffeine to 1 small cup daily. Examples are soda, coffee, tea, herbal tea, energy drinks, and chocolate Avoid decongestant medicines like Sudafed, Mucinex-D, and Zyrtec-D. Qsymia may cause you to feel dizzy, drowsy, or confused, or to have trouble thinking  or speaking. Do not drive or do anything else that could be dangerous until you know how this medicine affects you.  Women who can become pregnant: Use effective birth control (contraception) consistently while taking Qsymia. If you miss a menstrual period, STOP Qsymia and call our office immediately. Pregnancy tests will be performed at your appointment if indicated.  What side effects may I notice when taking Qsymia? Side effects that usually do not require medical attention (report to our office if they continue or are bothersome): Dry mouth (drink at least 64 oz of fluid daily) Constipation (you may take over the counter laxative if needed) Metallic taste in your mouth when drinking carbonated beverages Numbness or tingling in the hands, arms, feet or face that lasts more than a week Headache Sudden changes in vision Mental fuzziness (problems with concentration, attention, memory or speech) Trouble sleeping (insomnia) Side effects that you should report to our office as soon as possible: Increases in heart rate and/or palpitations (feeling like your heart is racing or pounding in your chest that lasts several minutes) Chest pain Increased blood pressure Dizziness or feeling faint Shortness of breath Irritability Feeling anxious, agitated, restless, or nervous Depression or severe changes in mood Problems urinating Unusual swelling of the legs Vomiting   Other important information You will be asked to sign an informed consent prior to starting Qysmia Qsymia is a federally controlled substance. Keep Qsymia in a safe place to prevent misuse and abuse. Selling or giving away Qsymia may harm others, and is against the law.  Your prescription   will be sent to the pharmacy during your visit.  Refills will require an office visit. Your insurance may not cover the cost of this medicine. Our office will complete a pre-authorization if required by your insurance.  

## 2022-04-19 NOTE — Progress Notes (Signed)
Chief Complaint:   OBESITY Andrea Alvarez is here to discuss her progress with her obesity treatment plan along with follow-up of her obesity related diagnoses. Andrea Alvarez is on the Category 2 Plan and states she is following her eating plan approximately 0% of the time. Andrea Alvarez states she is exercising 0 minutes 0 times per week.  Today's visit was #: 22 Starting weight: 197 lbs Starting date: 10/15/2020 Today's weight: 170 lbs Today's date: 04/19/2022 Total lbs lost to date: 27 lbs Total lbs lost since last in-office visit: 0  Interim History: Andrea Alvarez was seen here last on 02/22/22 and gained 7 lbs. Struggling with polyphagia and cravings. She has been moving and went on a two day trip over the past month. Has not been able to cook at home. She was unable to take [place] or [place]govy due to cost. She has tried Topamax and Phentermine in the past.   Subjective:   1. Polyphagia/Insulin resistance Andrea Alvarez was placed on Metformin 500 mg twice a day after her last visit. She stopped 2 weeks ago. Notes since stopping Metformin she has increased polyphagia and cravings. She has tried Metformin, Ozempic and Mounjaro in the past. Stopped Mounjaro due to cost   2. Vitamin D insufficiency Andrea Alvarez stopped taking Vit D 2 weeks ago. Denies any side effects while taking.  3. Low vitamin B12 level Andrea Alvarez is not currently on B12 vitamin or multivitamin. She notes fatigue.  4. Other hyperlipidemia Andrea Alvarez has never been on medication.   Assessment/Plan:   1. Polyphagia/Insulin resistance We will obtain labs today.  Doesn't want to restart Metformin at this time  Andrea Alvarez will continue to work on weight loss, exercise, and decreasing simple carbohydrates to help decrease the risk of diabetes. Andrea Alvarez agreed to follow-up with [place] as directed to closely monitor her progress.   - Comprehensive metabolic panel - Hemoglobin A1c - Insulin, random - TSH  2. Vitamin D insufficiency We will obtain labs today.  Will  discuss labs at next visit.    - Comprehensive metabolic panel - VITAMIN D 25 Hydroxy (Vit-D Deficiency, Fractures) - TSH  3. Low vitamin B12 level We will obtain labs today.  - Comprehensive metabolic panel - Vitamin I09 - TSH  4. Other hyperlipidemia We will obtain labs today.  - Lipid Panel With LDL/HDL Ratio - TSH  5. Obesity, current BMI 31.2 We will obtain labs today. START Qsymia 3.75-23 mg daily for 2 weeks and then she can increase to Qsymia 7.5-46 mg daily. Side effects discussed. Consent signed and discussed with patient today.  Knows not to get pregnant while taking as topamax can cause birth defects.  Has taken Phentermine and Topamax in the past and did well. Did report some side effects of tingling in her hands.    - TSH  -Start Phentermine-Topiramate (QSYMIA) 3.75-23 MG CP24; Take 1 tablet by mouth daily.  Dispense: 14 capsule; Refill: 0  -Start Phentermine-Topiramate (QSYMIA) 7.5-46 MG CP24; Take 1 tablet by mouth daily.  Dispense: 30 capsule; Refill: 0  Andrea Alvarez is currently in the action stage of change. As such, her goal is to continue with weight loss efforts. She has agreed to the Category 2 Plan.   Exercise goals: All adults should avoid inactivity. Some physical activity is better than none, and adults who participate in any amount of physical activity gain some health benefits.  Behavioral modification strategies: increasing lean protein intake, increasing vegetables, and increasing water intake.  Andrea Alvarez has agreed to follow-up with our clinic  in 4 weeks. She was informed of the importance of frequent follow-up visits to maximize her success with intensive lifestyle modifications for her multiple health conditions.   Andrea Alvarez was informed we would discuss her lab results at her next visit unless there is a critical issue that needs to be addressed sooner. Andrea Alvarez agreed to keep her next visit at the agreed upon time to discuss these results.  Objective:    Blood pressure 132/83, pulse 68, temperature 97.6 F (36.4 C), temperature source Oral, resp. rate 16, height 5\' 2"  (1.575 m), weight 170 lb (77.1 kg), last menstrual period 11/16/2016, SpO2 100 %. Body mass index is 31.09 kg/m.  General: Cooperative, alert, well developed, in no acute distress. HEENT: Conjunctivae and lids unremarkable. Cardiovascular: Regular rhythm.  Lungs: Normal work of breathing. Neurologic: No focal deficits.   Lab Results  Component Value Date   CREATININE 0.75 03/31/2021   BUN 18 03/31/2021   NA 142 03/31/2021   K 4.9 03/31/2021   CL 102 03/31/2021   CO2 23 03/31/2021   Lab Results  Component Value Date   ALT 17 03/31/2021   AST 12 03/31/2021   ALKPHOS 84 03/31/2021   BILITOT 0.4 03/31/2021   Lab Results  Component Value Date   HGBA1C 5.3 03/31/2021   HGBA1C 5.2 07/04/2020   HGBA1C 5.3 07/02/2019   HGBA1C 5.4 08/28/2018   HGBA1C 5.2 05/16/2018   Lab Results  Component Value Date   INSULIN 10.8 03/31/2021   INSULIN 11.9 10/15/2020   INSULIN 13.8 08/28/2018   INSULIN 10.8 05/16/2018   Lab Results  Component Value Date   TSH 2.540 10/15/2020   Lab Results  Component Value Date   CHOL 239 (H) 03/31/2021   HDL 68 03/31/2021   LDLCALC 158 (H) 03/31/2021   TRIG 75 03/31/2021   CHOLHDL 3.2 10/15/2020   Lab Results  Component Value Date   VD25OH 51.6 03/31/2021   VD25OH 32.2 10/15/2020   VD25OH 42.2 08/28/2018   Lab Results  Component Value Date   WBC 6.3 10/15/2020   HGB 14.1 10/15/2020   HCT 43.7 10/15/2020   MCV 91 10/15/2020   PLT 352 10/15/2020   No results found for: "IRON", "TIBC", "FERRITIN"  Attestation Statements:   Reviewed by clinician on day of visit: allergies, medications, problem list, medical history, surgical history, family history, social history, and previous encounter notes.  I, Brendell Tyus, RMA, am acting as transcriptionist for 10/17/2020, FNP.  I have reviewed the above documentation  for accuracy and completeness, and I agree with the above. Irene Limbo, FNP

## 2022-04-21 LAB — COMPREHENSIVE METABOLIC PANEL
ALT: 24 IU/L (ref 0–32)
AST: 20 IU/L (ref 0–40)
Albumin/Globulin Ratio: 1.7 (ref 1.2–2.2)
Albumin: 4.8 g/dL (ref 3.8–4.9)
Alkaline Phosphatase: 86 IU/L (ref 44–121)
BUN/Creatinine Ratio: 24 — ABNORMAL HIGH (ref 9–23)
BUN: 19 mg/dL (ref 6–24)
Bilirubin Total: 0.2 mg/dL (ref 0.0–1.2)
CO2: 23 mmol/L (ref 20–29)
Calcium: 9.9 mg/dL (ref 8.7–10.2)
Chloride: 103 mmol/L (ref 96–106)
Creatinine, Ser: 0.78 mg/dL (ref 0.57–1.00)
Globulin, Total: 2.8 g/dL (ref 1.5–4.5)
Glucose: 89 mg/dL (ref 70–99)
Potassium: 4.5 mmol/L (ref 3.5–5.2)
Sodium: 140 mmol/L (ref 134–144)
Total Protein: 7.6 g/dL (ref 6.0–8.5)
eGFR: 89 mL/min/{1.73_m2} (ref >59)

## 2022-04-21 LAB — TSH: TSH: 1.69 u[IU]/mL (ref 0.450–4.500)

## 2022-04-21 LAB — LIPID PANEL WITH LDL/HDL RATIO
Cholesterol, Total: 239 mg/dL — ABNORMAL HIGH (ref 100–199)
HDL: 85 mg/dL (ref >39)
LDL Chol Calc (NIH): 145 mg/dL — ABNORMAL HIGH (ref 0–99)
LDL/HDL Ratio: 1.7 ratio (ref 0.0–3.2)
Triglycerides: 54 mg/dL (ref 0–149)
VLDL Cholesterol Cal: 9 mg/dL (ref 5–40)

## 2022-04-21 LAB — HEMOGLOBIN A1C
Est. average glucose Bld gHb Est-mCnc: 108 mg/dL
Hgb A1c MFr Bld: 5.4 % (ref 4.8–5.6)

## 2022-04-21 LAB — VITAMIN B12: Vitamin B-12: 314 pg/mL (ref 232–1245)

## 2022-04-21 LAB — INSULIN, RANDOM: INSULIN: 9.9 u[IU]/mL (ref 2.6–24.9)

## 2022-04-21 LAB — VITAMIN D 25 HYDROXY (VIT D DEFICIENCY, FRACTURES): Vit D, 25-Hydroxy: 40.9 ng/mL (ref 30.0–100.0)

## 2022-05-17 ENCOUNTER — Other Ambulatory Visit (HOSPITAL_COMMUNITY): Payer: Self-pay

## 2022-05-17 ENCOUNTER — Encounter: Payer: Self-pay | Admitting: Nurse Practitioner

## 2022-05-17 ENCOUNTER — Ambulatory Visit (INDEPENDENT_AMBULATORY_CARE_PROVIDER_SITE_OTHER): Payer: Commercial Managed Care - PPO | Admitting: Nurse Practitioner

## 2022-05-17 VITALS — BP 129/84 | HR 67 | Temp 97.3°F | Ht 62.0 in | Wt 170.0 lb

## 2022-05-17 DIAGNOSIS — E559 Vitamin D deficiency, unspecified: Secondary | ICD-10-CM

## 2022-05-17 DIAGNOSIS — Z6831 Body mass index (BMI) 31.0-31.9, adult: Secondary | ICD-10-CM | POA: Diagnosis not present

## 2022-05-17 DIAGNOSIS — E7849 Other hyperlipidemia: Secondary | ICD-10-CM

## 2022-05-17 DIAGNOSIS — E669 Obesity, unspecified: Secondary | ICD-10-CM | POA: Diagnosis not present

## 2022-05-17 MED ORDER — VITAMIN D (ERGOCALCIFEROL) 1.25 MG (50000 UNIT) PO CAPS
50000.0000 [IU] | ORAL_CAPSULE | ORAL | 0 refills | Status: DC
  Filled 2022-05-17: qty 12, 84d supply, fill #0

## 2022-05-17 NOTE — Patient Instructions (Signed)
The 10-year ASCVD risk score (Arnett DK, et al., 2019) is: 1.9%   Values used to calculate the score:     Age: 56 years     Sex: Female     Is Non-Hispanic African American: No     Diabetic: No     Tobacco smoker: No     Systolic Blood Pressure: 997 mmHg     Is BP treated: No     HDL Cholesterol: 85 mg/dL     Total Cholesterol: 239 mg/dL

## 2022-05-18 NOTE — Progress Notes (Unsigned)
Chief Complaint:   OBESITY Andrea Alvarez is here to discuss her progress with her obesity treatment plan along with follow-up of her obesity related diagnoses. Andrea Alvarez is on the Category 2 Plan and states she is following her eating plan approximately 75% of the time. Andrea Alvarez states she is exercise 0 minutes 0 times per week.  Today's visit was #: 23 Starting weight: 197 lbs Starting date: 10/15/2020 Today's weight: 170 lbs Today's date: 05/17/2022 Total lbs lost to date: 27 lbs Total lbs lost since last in-office visit: 0  Interim History: Andrea Alvarez is taking Qsymia 7.5-46 mg. Notes some tingling in her hands. Feels like she is getting back on track. Denies any chest pain,shortness of breath or palpitations. Went walking this past weekend. Drinking water and unsweetened tea.   Subjective:   1. Other hyperlipidemia Labs discussed during visit today. Andrea Alvarez is not currently on medication. Family history: unknown, as she is adopted.  2. Vitamin D insufficiency Labs discussed during visit today. Andrea Alvarez is currently taking prescription Vit D 50,000 IU once a week. Denies any nausea, vomiting or muscle weakness. Forgets to take on a regular basis. She left several in the car and reported they melted and was unable to take them. Needs DEXA.  Assessment/Plan:   1. Other hyperlipidemia ASCVD reviewed with patient. Cardiovascular risk and specific lipid/LDL goals reviewed.  We discussed several lifestyle modifications today and Andrea Alvarez will continue to work on diet, exercise and weight loss efforts. Orders and follow up as documented in patient record.   The 10-year ASCVD risk score (Arnett DK, et al., 2019) is: 1.9%   Values used to calculate the score:     Age: 56 years     Sex: Female     Is Non-Hispanic African American: No     Diabetic: No     Tobacco smoker: No     Systolic Blood Pressure: 086 mmHg     Is BP treated: No     HDL Cholesterol: 85 mg/dL     Total Cholesterol: 239  mg/dL  Counseling Intensive lifestyle modifications are the first line treatment for this issue. Dietary changes: Increase soluble fiber. Decrease simple carbohydrates. Exercise changes: Moderate to vigorous-intensity aerobic activity 150 minutes per week if tolerated. Lipid-lowering medications: see documented in medical record.    2. Vitamin D insufficiency Discuss DEXA with PCP. We will refill Vit D 50,000 IU once weekly for 3 months with 0 refills. Low Vitamin D level contributes to fatigue and are associated with obesity, breast, and colon cancer. She agrees to continue to take prescription Vitamin D @50 ,000 IU every week and will follow-up for routine testing of Vitamin D, at least 2-3 times per year to avoid over-replacement.   -Refill Vitamin D, Ergocalciferol, (DRISDOL) 1.25 MG (50000 UNIT) CAPS capsule; Take 1 capsule by mouth every 7 days.  Dispense: 12 capsule; Refill: 0  3. Obesity, current BMI 31.1 Andrea Alvarez is currently in the action stage of change. As such, her goal is to continue with weight loss efforts. She has agreed to the Category 2 Plan.   Exercise goals: All adults should avoid inactivity. Some physical activity is better than none, and adults who participate in any amount of physical activity gain some health benefits.  Behavioral modification strategies: increasing water intake.  Andrea Alvarez has agreed to follow-up with our clinic in 4 weeks. She was informed of the importance of frequent follow-up visits to maximize her success with intensive lifestyle modifications for her multiple health conditions.  Objective:   Blood pressure 129/84, pulse 67, temperature (!) 97.3 F (36.3 C), temperature source Oral, height 5\' 2"  (1.575 m), weight 170 lb (77.1 kg), last menstrual period 11/16/2016, SpO2 99 %. Body mass index is 31.09 kg/m.  General: Cooperative, alert, well developed, in no acute distress. HEENT: Conjunctivae and lids unremarkable. Cardiovascular: Regular  rhythm.  Lungs: Normal work of breathing. Neurologic: No focal deficits.   Lab Results  Component Value Date   CREATININE 0.78 04/19/2022   BUN 19 04/19/2022   NA 140 04/19/2022   K 4.5 04/19/2022   CL 103 04/19/2022   CO2 23 04/19/2022   Lab Results  Component Value Date   ALT 24 04/19/2022   AST 20 04/19/2022   ALKPHOS 86 04/19/2022   BILITOT 0.2 04/19/2022   Lab Results  Component Value Date   HGBA1C 5.4 04/19/2022   HGBA1C 5.3 03/31/2021   HGBA1C 5.2 07/04/2020   HGBA1C 5.3 07/02/2019   HGBA1C 5.4 08/28/2018   Lab Results  Component Value Date   INSULIN 9.9 04/19/2022   INSULIN 10.8 03/31/2021   INSULIN 11.9 10/15/2020   INSULIN 13.8 08/28/2018   INSULIN 10.8 05/16/2018   Lab Results  Component Value Date   TSH 1.690 04/19/2022   Lab Results  Component Value Date   CHOL 239 (H) 04/19/2022   HDL 85 04/19/2022   LDLCALC 145 (H) 04/19/2022   TRIG 54 04/19/2022   CHOLHDL 3.2 10/15/2020   Lab Results  Component Value Date   VD25OH 40.9 04/19/2022   VD25OH 51.6 03/31/2021   VD25OH 32.2 10/15/2020   Lab Results  Component Value Date   WBC 6.3 10/15/2020   HGB 14.1 10/15/2020   HCT 43.7 10/15/2020   MCV 91 10/15/2020   PLT 352 10/15/2020   No results found for: "IRON", "TIBC", "FERRITIN"  Attestation Statements:   Reviewed by clinician on day of visit: allergies, medications, problem list, medical history, surgical history, family history, social history, and previous encounter notes.  I, Brendell Tyus, RMA, am acting as transcriptionist for Everardo Pacific, FNP.  I have reviewed the above documentation for accuracy and completeness, and I agree with the above. Everardo Pacific, FNP

## 2022-06-07 ENCOUNTER — Encounter: Payer: Self-pay | Admitting: Nurse Practitioner

## 2022-06-07 ENCOUNTER — Ambulatory Visit (INDEPENDENT_AMBULATORY_CARE_PROVIDER_SITE_OTHER): Payer: Commercial Managed Care - PPO | Admitting: Nurse Practitioner

## 2022-06-07 VITALS — BP 138/86 | HR 71 | Temp 98.1°F | Ht 62.0 in | Wt 172.0 lb

## 2022-06-07 DIAGNOSIS — Z6831 Body mass index (BMI) 31.0-31.9, adult: Secondary | ICD-10-CM

## 2022-06-07 DIAGNOSIS — E559 Vitamin D deficiency, unspecified: Secondary | ICD-10-CM | POA: Diagnosis not present

## 2022-06-07 DIAGNOSIS — E669 Obesity, unspecified: Secondary | ICD-10-CM

## 2022-06-07 MED ORDER — QSYMIA 11.25-69 MG PO CP24: 1.0000 | ORAL_CAPSULE | Freq: Every day | ORAL | 0 refills | Status: DC

## 2022-06-07 NOTE — Patient Instructions (Signed)
Phentermine Qsymia 114 Contrave 99 Saxenda Wegovy Zepbound  What is Qsymia and how does it work?  Qsymia is a prescription only medicine to help with your weight loss. It is a combination of two medicines that are low dose, long-acting: Phentermine & Topiramate. Qsymia contains low dose Phentermine which is a stimulant medicine that could affect your heart rate and blood pressure Qsymia is designed to help you feel satisfied faster that will help you to decrease portion size. Also, it helps curb late night snacking habits. Some food you usually enjoy may start to taste differently which will help you make healthier food choices.  This medicine will be most effective when combined with a reduced calorie diet and physical activity.  How should I take Qsymia? Take daily in the morning with breakfast. Swallow the extended-release capsule whole. Do not crush, break, or chew it.  If you miss a dose, take it as soon as possible. If it is after 12pm, skip the missed dose and go back to your regular dosing schedule. Do not take extra medicine to make up for the missed dose. You have received two separate prescriptions today. You will initially take a lower dose of 3.75mg /23mg  for 14 days then increase to a higher dose of 7.5mg /46mg  for maintenance for 30 days. There are 4 total dosing options. Your provider will discuss any need to go up to a higher dose during your office visits.  If you are taking Levothyroxine, take the Levothyroxine 1 hours before breakfast and the take the Qsymia 1 hour after breakfast. Do not stop taking Qsymia without talking to your provider. Stopping Qsymia suddenly can cause serious side effects, such as seizures and headaches.   What should I avoid while taking Qsymia? Limit caffeine to 1 small cup daily. Examples are soda, coffee, tea, herbal tea, energy drinks, and chocolate Avoid decongestant medicines like Sudafed, Mucinex-D, and Zyrtec-D. Qsymia may cause  you to feel dizzy, drowsy, or confused, or to have trouble thinking or speaking. Do not drive or do anything else that could be dangerous until you know how this medicine affects you.  Women who can become pregnant: Use effective birth control (contraception) consistently while taking Qsymia. If you miss a menstrual period, STOP Qsymia and call our office immediately. Pregnancy tests will be performed at your appointment if indicated.  What side effects may I notice when taking Qsymia? Side effects that usually do not require medical attention (report to our office if they continue or are bothersome): Dry mouth (drink at least 64 oz of fluid daily) Constipation (you may take over the counter laxative if needed) Metallic taste in your mouth when drinking carbonated beverages Numbness or tingling in the hands, arms, feet or face that lasts more than a week Headache Sudden changes in vision Mental fuzziness (problems with concentration, attention, memory or speech) Trouble sleeping (insomnia) Side effects that you should report to our office as soon as possible: Increases in heart rate and/or palpitations (feeling like your heart is racing or pounding in your chest that lasts several minutes) Chest pain Increased blood pressure Dizziness or feeling faint Shortness of breath Irritability Feeling anxious, agitated, restless, or nervous Depression or severe changes in mood Problems urinating Unusual swelling of the legs Vomiting   Other important information You will be asked to sign an informed consent prior to starting Qysmia Qsymia is a federally controlled substance. Keep Qsymia in a safe place to prevent misuse and abuse. Selling or giving away Qsymia may harm  others, and is against the law.  Your prescription will be sent to the pharmacy during your visit.  Refills will require an office visit. Your insurance may not cover the cost of this medicine. Our office will complete a  pre-authorization if required by your insurance.    Steps to starting your start Contrave  The office will send a prior authorization request to your insurance company for approval. We will send you a mychart message once we hear back from your insurance with a decision.  This can take up to 7-10 business days.   Is Contrave an option for me? Do not take Contrave if you:   Have uncontrolled hypertension  Have or have had seizures  Use other medicines that contain bupropion such as Wellbutrin, Wellbutrin SR, Wellbutrin XL, and Aplenzin.  Have or have had an eating disorder called anorexia (eating very little) or bulimia (eating too much and vomiting to avoid gaining weight)  Are dependent on opioid pain medicines or use medicines to help stop taking opioids such as methadone or buprenorphine, or are in opiate withdrawal  Drink a lot of alcohol and abruptly stop drinking, or use medicines called sedatives (these make you sleepy), benzodiazepines, or anti-seizure medicines and you stop using them all of a sudden  Are taking medicines called monoamine oxidase inhibitors (MAOIs). Ask your healthcare provider or pharmacist if you are not sure if you take an MAOI, including linezoid. Do not start Contrave until you have stopped taking your MAOI for at least 14 days.  Are allergic to naltrexone HCl or bupropion HCl or any of the ingredients in Contrave. See the end of this Medication Guide for a complete list of ingredients in Contrave.  Are pregnant or planning to become pregnant. Tell your healthcare provider right away if you become pregnant while taking CONTRAVE  What is Contrave and how does it work?  Contrave is a prescription only medicine to help with your weight loss. It works on an area of the brain that controls your appetite.  It is a combination of two medicines that are extended release: naltrexone HCL and bupropion HCL.  One of the ingredients in Contrave, bupropion, is the  same ingredient in some other medicines used to treat depression and to help people quit smoking. However, Contrave is not approved to treat depression or other mental illnesses, or to help people quit smoking (smoking cessation).   This medicine will be most effective when combined with a reduced calorie diet and physical activity.  How should I take Contrave?  Take exactly as your provider tells you to. It is an increasing dose according to the following chart:  Contrave How should I take Contrave?   It is best to take Contrave with food. However, do not take with high-fat meals.   Swallow the extended-release tablet whole. Do not crush, break, or chew it.   If you miss a dose, skip the missed dose and go back to your regular dosing schedule. Do not take extra medicine to make up for a missed dose.  Do not take more than 2 tablets in the morning and 2 tablets in the evening.  Do not take more than 2 tablets at the same time or more than 4 tablets in 1 day.  Do not stop taking Contrave without talking to your provider.   Stopping Contrave suddenly can cause serious side effects, such as seizures.  Swallow Contrave tablets whole. Do not cut, chew, or crush tablets.  Do not  take Contrave when eating a high-fat meal. It may increase your risk of seizures.   What should I avoid while taking Contrave?  You should avoid other medicines that contain bupropion such as Wellbutrin, Wellbutrin SR, Wellbutrin XL and Aplenzin.  You should avoid opioid pain medications or medicines to help stop taking opioids such as methadone or buprenorphine. There may be a risk of opioid overdose.  Do not drink alcohol while taking Contrave. If you drink alcohol, talk to your provider before starting Contrave.   Avoid eating a high-fat meal at the same time you take Contrave. It may increase your risk of seizures.   Women who can become pregnant: Use effective birth control (contraception) consistently  while taking Contrave. If you miss a menstrual period, STOP Contrave and call our office immediately. Monthly pregnancy tests will be performed at your appointment if indicated.  What side effects may I notice when taking Contrave?  Side effects that usually do not require medical attention (report to our office if they continue or are bothersome): o Nausea o Constipation (you may take over the counter laxative if needed) o Headache o Dry mouth (drink at least 64 oz. of fluid daily) o Trouble sleeping (insomnia) o Vomiting o Dizziness o Diarrhea   Side effects that you should report to our office as soon as possible: o Seizures: DO NOT take Contrave again o Depression or severe changes in mood o Thoughts about suicide or dying o Feeling anxious, agitated, irritable, restless, or nervous o Slowed breathing and/or shallow breathing o Severe drowsiness o Confusion o Signs of allergic reaction such as rash, itching, hives, fever, swollen lymph glands, painful sores in your mouth or around your eyes, swelling of your lips or tongue, chest pain, or trouble breathing o Increased blood pressure o Increased heart rate or palpitations o Stomach pain lasting more than a few days o Dark urine o Yellowing of the whites of your eyes o Severe tiredness o Sudden changes in vision o Swelling or redness in or around the eye  o Low blood sugar in Type 2 Diabetes.   Other important information  While taking CONTRAVE, you or your family members should pay close attention to any changes, especially sudden changes, in mood, behaviors, thoughts, or feelings and maintain communication with your provider. You will be asked to sign an informed consent prior to starting Contrave.  If another healthcare provider prescribes narcotic pain medication for you, please call our office immediately for advice regarding Contrave.  Your prescription will be sent electronically to your pharmacy.   Refills will  require an office visit

## 2022-06-14 ENCOUNTER — Ambulatory Visit: Payer: Commercial Managed Care - PPO | Admitting: Nurse Practitioner

## 2022-06-17 NOTE — Progress Notes (Signed)
Chief Complaint:   OBESITY Andrea Alvarez is here to discuss her progress with her obesity treatment plan along with follow-up of her obesity related diagnoses. [name] is on the Category 2 Plan and states she is following her eating plan approximately 75% of the time. Willodene states she is exercising 0 minutes 0 times per week.  Today's visit was #: 24 Starting weight: 197 lbs Starting date: 10/15/2020 Today's weight: 172 lbs Today's date: 06/07/2022 Total lbs lost to date: 25 lbs Total lbs lost since last in-office visit: 0  Interim History: [name] has been working more since her last visit + has not been able to be as active. She is taking Qsymia 7.5-46 mg. Denies any side effects. She is not skipping meals. Drinking water and unsweetened tea. Celebrated Thanksgiving yesterday and plans to go to the mountains this weekend. Struggling with some hunger and cravings.  Subjective:   1. Vitamin D insufficiency Santiago is currently taking prescription Vit D 50,000 IU once a week. Denies any nausea, vomiting or muscle weakness.  Assessment/Plan:   1. Vitamin D insufficiency Shakeerah needs a DEXA. Low Vitamin D level contributes to fatigue and are associated with obesity, breast, and colon cancer. She agrees to continue to take prescription Vitamin D @50 ,000 IU every week and will follow-up for routine testing of Vitamin D, at least 2-3 times per year to avoid over-replacement.   2. Obesity, current BMI 31.5 Increase/Refill Qsymia to 11.25-69 mg daily for 1 month with 0 refills. Side effects discussed.   -Increase/Refill Phentermine-Topiramate (QSYMIA) 11.25-69 MG CP24; Take 1 tablet by mouth daily.  Dispense: 30 capsule; Refill: 0  [name] is currently in the action stage of change. As such, her goal is to continue with weight loss efforts. She has agreed to the Category 2 Plan.   Exercise goals: All adults should avoid inactivity. Some physical activity is better than none, and adults who  participate in any amount of physical activity gain some health benefits.  Behavioral modification strategies: increasing water intake, meal planning and cooking strategies, and holiday eating strategies .  [name] has agreed to follow-up with our clinic in 4 weeks. She was informed of the importance of frequent follow-up visits to maximize her success with intensive lifestyle modifications for her multiple health conditions.   Objective:   Blood pressure 138/86, pulse 71, temperature 98.1 F (36.7 C), temperature source Oral, height 5\' 2"  (1.575 m), weight 172 lb (78 kg), last menstrual period 11/16/2016, SpO2 99 %. Body mass index is 31.46 kg/m.  General: Cooperative, alert, well developed, in no acute distress. HEENT: Conjunctivae and lids unremarkable. Cardiovascular: Regular rhythm.  Lungs: Normal work of breathing. Neurologic: No focal deficits.   Lab Results  Component Value Date   CREATININE 0.78 04/19/2022   BUN 19 04/19/2022   NA 140 04/19/2022   K 4.5 04/19/2022   CL 103 04/19/2022   CO2 23 04/19/2022   Lab Results  Component Value Date   ALT 24 04/19/2022   AST 20 04/19/2022   ALKPHOS 86 04/19/2022   BILITOT 0.2 04/19/2022   Lab Results  Component Value Date   HGBA1C 5.4 04/19/2022   HGBA1C 5.3 03/31/2021   HGBA1C 5.2 07/04/2020   HGBA1C 5.3 07/02/2019   HGBA1C 5.4 08/28/2018   Lab Results  Component Value Date   INSULIN 9.9 04/19/2022   INSULIN 10.8 03/31/2021   INSULIN 11.9 10/15/2020   INSULIN 13.8 08/28/2018   INSULIN 10.8 05/16/2018   Lab Results  Component  Value Date   TSH 1.690 04/19/2022   Lab Results  Component Value Date   CHOL 239 (H) 04/19/2022   HDL 85 04/19/2022   LDLCALC 145 (H) 04/19/2022   TRIG 54 04/19/2022   CHOLHDL 3.2 10/15/2020   Lab Results  Component Value Date   VD25OH 40.9 04/19/2022   VD25OH 51.6 03/31/2021   VD25OH 32.2 10/15/2020   Lab Results  Component Value Date   WBC 6.3 10/15/2020   HGB 14.1  10/15/2020   HCT 43.7 10/15/2020   MCV 91 10/15/2020   PLT 352 10/15/2020   No results found for: "IRON", "TIBC", "FERRITIN"  Attestation Statements:   Reviewed by clinician on day of visit: allergies, medications, problem list, medical history, surgical history, family history, social history, and previous encounter notes.  I, Brendell Tyus, RMA, am acting as transcriptionist for Everardo Pacific, FNP.  I have reviewed the above documentation for accuracy and completeness, and I agree with the above. Everardo Pacific, FNP

## 2022-06-21 ENCOUNTER — Ambulatory Visit: Payer: Commercial Managed Care - PPO | Admitting: Bariatrics

## 2022-07-05 ENCOUNTER — Encounter: Payer: Self-pay | Admitting: Nurse Practitioner

## 2022-07-05 ENCOUNTER — Ambulatory Visit (INDEPENDENT_AMBULATORY_CARE_PROVIDER_SITE_OTHER): Payer: Commercial Managed Care - PPO | Admitting: Nurse Practitioner

## 2022-07-05 VITALS — BP 122/80 | HR 68 | Temp 98.2°F | Ht 62.0 in | Wt 171.0 lb

## 2022-07-05 DIAGNOSIS — E669 Obesity, unspecified: Secondary | ICD-10-CM

## 2022-07-05 DIAGNOSIS — Z6831 Body mass index (BMI) 31.0-31.9, adult: Secondary | ICD-10-CM | POA: Diagnosis not present

## 2022-07-05 DIAGNOSIS — E7849 Other hyperlipidemia: Secondary | ICD-10-CM

## 2022-07-05 MED ORDER — QSYMIA 11.25-69 MG PO CP24: 1.0000 | ORAL_CAPSULE | Freq: Every day | ORAL | 0 refills | Status: DC

## 2022-07-13 NOTE — Progress Notes (Unsigned)
Chief Complaint:   OBESITY Andrea Alvarez is here to discuss her progress with her obesity treatment plan along with follow-up of her obesity related diagnoses. Andrea Alvarez is on the Category 2 Plan and states she is following her eating plan approximately 70% of the time. Andrea Alvarez states she is exercising 0 minutes 0 times per week.  Today's visit was #: 25 Starting weight: 197 lbs Starting date: 10/15/2020 Today's weight: 171 lbs Today's date: 07/05/2022 Total lbs lost to date: 26 lbs Total lbs lost since last in-office visit: 1  Interim History: Andrea Alvarez is taking Qsymia 11.26-69 mg. Denies any side effects. Feels helps with hunger and cravings. [place] to the mountains this past weekend. Has been following [facility]/[place] plan. Highest weight 217 lbs. Goal weight 160s. Drinking more water.  Subjective:   1. Other hyperlipidemia Andrea Alvarez has never been on medication.  Assessment/Plan:   1. Other hyperlipidemia Cardiovascular risk and specific lipid/LDL goals reviewed.  We discussed several lifestyle modifications today and Andrea Alvarez will continue to work on diet, exercise and weight loss efforts. Orders and follow up as documented in patient record.   Counseling Intensive lifestyle modifications are the first line treatment for this issue. Dietary changes: Increase soluble fiber. Decrease simple carbohydrates. Exercise changes: Moderate to vigorous-intensity aerobic activity 150 minutes per week if tolerated. Lipid-lowering medications: see documented in medical record.   The 10-year ASCVD risk score (Arnett DK, et al., 2019) is: 1.6%   Values used to calculate the score:     Age: 56 years     Sex: Female     Is Non-Hispanic African American: No     Diabetic: No     Tobacco smoker: No     Systolic Blood Pressure: 122 mmHg     Is BP treated: No     HDL Cholesterol: 85 mg/dL     Total Cholesterol: 239 mg/dL   2. Obesity, current BMI 31.3 We will refill Qsymia dose 3. Side effects discussed.    -Refill Phentermine-Topiramate (QSYMIA) 11.25-69 MG CP24; Take 1 tablet by mouth daily.  Dispense: 30 capsule; Refill: 0  Andrea Alvarez is currently in the action stage of change. As such, her goal is to continue with weight loss efforts. She has agreed to the Category 2 Plan and practicing portion control and making smarter food choices, such as increasing vegetables and decreasing simple carbohydrates.   Exercise goals: All adults should avoid inactivity. Some physical activity is better than none, and adults who participate in any amount of physical activity gain some health benefits.  Behavioral modification strategies: increasing lean protein intake, increasing water intake, and holiday eating strategies .  Andrea Alvarez has agreed to follow-up with our clinic in 4 weeks. She was informed of the importance of frequent follow-up visits to maximize her success with intensive lifestyle modifications for her multiple health conditions.   Objective:   Blood pressure 122/80, pulse 68, temperature 98.2 F (36.8 C), temperature source Oral, height 5\' 2"  (1.575 m), weight 171 lb (77.6 kg), last menstrual period 11/16/2016, SpO2 99 %. Body mass index is 31.28 kg/m.  General: Cooperative, alert, well developed, in no acute distress. HEENT: Conjunctivae and lids unremarkable. Cardiovascular: Regular rhythm.  Lungs: Normal work of breathing. Neurologic: No focal deficits.   Lab Results  Component Value Date   CREATININE 0.78 04/19/2022   BUN 19 04/19/2022   NA 140 04/19/2022   K 4.5 04/19/2022   CL 103 04/19/2022   CO2 23 04/19/2022   Lab Results  Component  Value Date   ALT 24 04/19/2022   AST 20 04/19/2022   ALKPHOS 86 04/19/2022   BILITOT 0.2 04/19/2022   Lab Results  Component Value Date   HGBA1C 5.4 04/19/2022   HGBA1C 5.3 03/31/2021   HGBA1C 5.2 07/04/2020   HGBA1C 5.3 07/02/2019   HGBA1C 5.4 08/28/2018   Lab Results  Component Value Date   INSULIN 9.9 04/19/2022   INSULIN 10.8  03/31/2021   INSULIN 11.9 10/15/2020   INSULIN 13.8 08/28/2018   INSULIN 10.8 05/16/2018   Lab Results  Component Value Date   TSH 1.690 04/19/2022   Lab Results  Component Value Date   CHOL 239 (H) 04/19/2022   HDL 85 04/19/2022   LDLCALC 145 (H) 04/19/2022   TRIG 54 04/19/2022   CHOLHDL 3.2 10/15/2020   Lab Results  Component Value Date   VD25OH 40.9 04/19/2022   VD25OH 51.6 03/31/2021   VD25OH 32.2 10/15/2020   Lab Results  Component Value Date   WBC 6.3 10/15/2020   HGB 14.1 10/15/2020   HCT 43.7 10/15/2020   MCV 91 10/15/2020   PLT 352 10/15/2020   No results found for: "IRON", "TIBC", "FERRITIN"  Attestation Statements:   Reviewed by clinician on day of visit: allergies, medications, problem list, medical history, surgical history, family history, social history, and previous encounter notes.  I, Brendell Tyus, RMA, am acting as transcriptionist for Irene Limbo, FNP.  I have reviewed the above documentation for accuracy and completeness, and I agree with the above. Irene Limbo, FNP

## 2022-08-05 ENCOUNTER — Encounter: Payer: Self-pay | Admitting: Nurse Practitioner

## 2022-08-05 ENCOUNTER — Other Ambulatory Visit (HOSPITAL_COMMUNITY): Payer: Self-pay

## 2022-08-05 ENCOUNTER — Ambulatory Visit (INDEPENDENT_AMBULATORY_CARE_PROVIDER_SITE_OTHER): Payer: Commercial Managed Care - PPO | Admitting: Nurse Practitioner

## 2022-08-05 VITALS — BP 140/69 | HR 64 | Temp 98.0°F | Ht 62.0 in | Wt 174.0 lb

## 2022-08-05 DIAGNOSIS — R632 Polyphagia: Secondary | ICD-10-CM | POA: Diagnosis not present

## 2022-08-05 DIAGNOSIS — E559 Vitamin D deficiency, unspecified: Secondary | ICD-10-CM

## 2022-08-05 DIAGNOSIS — Z6832 Body mass index (BMI) 32.0-32.9, adult: Secondary | ICD-10-CM | POA: Diagnosis not present

## 2022-08-05 DIAGNOSIS — E669 Obesity, unspecified: Secondary | ICD-10-CM

## 2022-08-05 MED ORDER — VITAMIN D (ERGOCALCIFEROL) 1.25 MG (50000 UNIT) PO CAPS
50000.0000 [IU] | ORAL_CAPSULE | ORAL | 0 refills | Status: DC
  Filled 2022-08-05: qty 4, 28d supply, fill #0

## 2022-08-05 MED ORDER — TOPIRAMATE 50 MG PO TABS
50.0000 mg | ORAL_TABLET | Freq: Every day | ORAL | 0 refills | Status: DC
  Filled 2022-08-05: qty 30, 30d supply, fill #0

## 2022-08-12 NOTE — Progress Notes (Signed)
Chief Complaint:   OBESITY Andrea Alvarez is here to discuss her progress with her obesity treatment plan along with follow-up of her obesity related diagnoses. Andrea Alvarez is on the Category 2 Plan and practicing portion control and making smarter food choices, such as increasing vegetables and decreasing simple carbohydrates and states she is following her eating plan approximately 0% of the time. Andrea Alvarez states she is hula hooping 15 minutes 2 times per week.  Today's visit was #: 42 Starting weight: 197 lbs Starting date: 10/15/2020 Today's weight: 174 lbs Today's date: 08/05/2022 Total lbs lost to date: 23 lbs Total lbs lost since last in-office visit: 0  Interim History: Andrea Alvarez stopped Qsymia 2 weeks ago due to side effects of GERD.  She has limited protein intake due to reflux.  Reflux has subsided since stopping Qsymia.  Struggling with hunger and cravings since stopping Qsymia.  Subjective:   1. Polyphagia Ronald tried Metformin, Ozempic and Mounjaro in the past.  Stopped Ozempic and Mounjaro due to cost.  2. Vitamin D insufficiency Andrea Alvarez is currently taking prescription Vit D 50,000 IU once a week.  Denies any nausea, vomiting or muscle weakness.  Assessment/Plan:   1. Polyphagia Start Topamax 50 mg daily for 1 month with 0 refills.  Take 1/2 tablet for 1 week and then increase to a full tablet.  Side effects discussed.   -Start topiramate (TOPAMAX) 50 MG tablet; Take 1 tablet (50 mg total) by mouth daily.  Dispense: 30 tablet; Refill: 0  2. Vitamin D insufficiency We will refill Vit D 50K IU once a week for 3 months with 0 refills.  -Refill Vitamin D, Ergocalciferol, (DRISDOL) 1.25 MG (50000 UNIT) CAPS capsule; Take 1 capsule by mouth every 7 days.  Dispense: 12 capsule; Refill: 0  3. Obesity, current BMI 32.0 Andrea Alvarez is currently in the action stage of change. As such, her goal is to continue with weight loss efforts. She has agreed to the Category 2 Plan.   Exercise goals:  All adults should avoid inactivity. Some physical activity is better than none, and adults who participate in any amount of physical activity gain some health benefits.  Behavioral modification strategies: increasing lean protein intake, increasing vegetables, and increasing water intake.  Andrea Alvarez has agreed to follow-up with our clinic in 4 weeks. She was informed of the importance of frequent follow-up visits to maximize her success with intensive lifestyle modifications for her multiple health conditions.   Objective:   Blood pressure (!) 140/69, pulse 64, temperature 98 F (36.7 C), height 5\' 2"  (1.575 m), weight 174 lb (78.9 kg), last menstrual period 11/16/2016, SpO2 100 %. Body mass index is 31.83 kg/m.  General: Cooperative, alert, well developed, in no acute distress. HEENT: Conjunctivae and lids unremarkable. Cardiovascular: Regular rhythm.  Lungs: Normal work of breathing. Neurologic: No focal deficits.   Lab Results  Component Value Date   CREATININE 0.78 04/19/2022   BUN 19 04/19/2022   NA 140 04/19/2022   K 4.5 04/19/2022   CL 103 04/19/2022   CO2 23 04/19/2022   Lab Results  Component Value Date   ALT 24 04/19/2022   AST 20 04/19/2022   ALKPHOS 86 04/19/2022   BILITOT 0.2 04/19/2022   Lab Results  Component Value Date   HGBA1C 5.4 04/19/2022   HGBA1C 5.3 03/31/2021   HGBA1C 5.2 07/04/2020   HGBA1C 5.3 07/02/2019   HGBA1C 5.4 08/28/2018   Lab Results  Component Value Date   INSULIN 9.9 04/19/2022  INSULIN 10.8 03/31/2021   INSULIN 11.9 10/15/2020   INSULIN 13.8 08/28/2018   INSULIN 10.8 05/16/2018   Lab Results  Component Value Date   TSH 1.690 04/19/2022   Lab Results  Component Value Date   CHOL 239 (H) 04/19/2022   HDL 85 04/19/2022   LDLCALC 145 (H) 04/19/2022   TRIG 54 04/19/2022   CHOLHDL 3.2 10/15/2020   Lab Results  Component Value Date   VD25OH 40.9 04/19/2022   VD25OH 51.6 03/31/2021   VD25OH 32.2 10/15/2020   Lab Results   Component Value Date   WBC 6.3 10/15/2020   HGB 14.1 10/15/2020   HCT 43.7 10/15/2020   MCV 91 10/15/2020   PLT 352 10/15/2020   No results found for: "IRON", "TIBC", "FERRITIN"  Attestation Statements:   Reviewed by clinician on day of visit: allergies, medications, problem list, medical history, surgical history, family history, social history, and previous encounter notes.  I, Brendell Tyus, RMA, am acting as transcriptionist for Everardo Pacific, FNP.  I have reviewed the above documentation for accuracy and completeness, and I agree with the above. Everardo Pacific, FNP

## 2022-08-19 ENCOUNTER — Encounter: Payer: No Typology Code available for payment source | Admitting: Physician Assistant

## 2022-09-07 ENCOUNTER — Ambulatory Visit (INDEPENDENT_AMBULATORY_CARE_PROVIDER_SITE_OTHER): Payer: Commercial Managed Care - PPO | Admitting: Nurse Practitioner

## 2022-09-07 ENCOUNTER — Encounter: Payer: Self-pay | Admitting: Nurse Practitioner

## 2022-09-07 VITALS — BP 138/69 | HR 68 | Temp 97.9°F | Ht 62.0 in | Wt 172.0 lb

## 2022-09-07 DIAGNOSIS — R632 Polyphagia: Secondary | ICD-10-CM | POA: Diagnosis not present

## 2022-09-07 DIAGNOSIS — E669 Obesity, unspecified: Secondary | ICD-10-CM | POA: Insufficient documentation

## 2022-09-07 DIAGNOSIS — E7849 Other hyperlipidemia: Secondary | ICD-10-CM

## 2022-09-07 DIAGNOSIS — E559 Vitamin D deficiency, unspecified: Secondary | ICD-10-CM | POA: Diagnosis not present

## 2022-09-07 DIAGNOSIS — Z6831 Body mass index (BMI) 31.0-31.9, adult: Secondary | ICD-10-CM

## 2022-09-07 MED ORDER — TOPIRAMATE 50 MG PO TABS: 50.0000 mg | ORAL_TABLET | Freq: Every day | ORAL | 0 refills | Status: DC

## 2022-09-07 NOTE — Progress Notes (Signed)
Office: [phone]  /  Fax: [phone]  WEIGHT SUMMARY AND BIOMETRICS  Medical Weight Loss Height: 5' 2"$  (1.575 m) Weight: 172 lb (78 kg) Temp: 97.9 F (36.6 C) Pulse Rate: 68 BP: 138/69 SpO2: 100 % Fasting: Yes Today's Visit #: 27 Weight at Last VIsit: 174lb Weight Lost Since Last Visit: 2lb  Body Fat %: 43 % Fat Mass (lbs): 74.2 lbs Muscle Mass (lbs): 93.4 lbs Total Body Water (lbs): 69.4 lbs Visceral Fat Rating : 11 Starting Date: 10/15/20 Starting Weight: 197lb Total Weight Loss (lbs): 25 lb (11.3 kg)    HPI  Chief Complaint: OBESITY  Andrea Alvarez is here to discuss her progress with her obesity treatment plan. She is on the the Category 2 Plan and states she is following her eating plan approximately 60 % of the time. She states she is exercising 10-20 minutes 7 days per week.   Interval History:  Since last office visit she has lost 2 lbs.     Pharmacotherapy for weight loss: she is currently taking Topamax 68m for medical weight loss.  Denies side effects.  Since starting Topamax her sugar cravings has decreased. She has stopped soda intake.  She has been eating out "a lot". She has been traveling to see her granddaughter wrestle.    Previous pharmacotherapy for medical weight loss:   she has tried Qsymia in the past for medical weight loss.  she stopped Qsymia due to side effects of reflux.  She has tried Metformin, Ozempic and Mounjaro in the past. Stopped Ozempic and Mounjaro due to cost.   Hyperlipidemia Medication(s): Never been on meds.  FH:  Mother, brother  Lab Results  Component Value Date   CHOL 239 (H) 04/19/2022   HDL 85 04/19/2022   LDLCALC 145 (H) 04/19/2022   TRIG 54 04/19/2022   CHOLHDL 3.2 10/15/2020   Lab Results  Component Value Date   ALT 24 04/19/2022   AST 20 04/19/2022   ALKPHOS 86 04/19/2022   BILITOT 0.2 04/19/2022   The 10-year ASCVD risk score (Arnett DK, et al., 2019) is: 2.1%   Values used to calculate the score:      Age: [age]years     Sex: Female     Is Non-Hispanic African American: No     Diabetic: No     Tobacco smoker: No     Systolic Blood Pressure: 10000000mmHg     Is BP treated: No     HDL Cholesterol: 85 mg/dL     Total Cholesterol: 239 mg/dL   Vit D deficiency  she is taking Vit D 50,000 IU weekly.  Denies side effects.  Denies nausea, vomiting or muscle weakness.    Lab Results  Component Value Date   VD25OH 40.9 04/19/2022   VD25OH 51.6 03/31/2021   VD25OH 32.2 10/15/2020      PHYSICAL EXAM:  Blood pressure 138/69, pulse 68, temperature 97.9 F (36.6 C), height 5' 2"$  (1.575 m), weight 172 lb (78 kg), last menstrual period 11/16/2016, SpO2 100 %. Body mass index is 31.46 kg/m.  General: She is overweight, cooperative, alert, well developed, and in no acute distress. PSYCH: Has normal mood, affect and thought process.   Extremities: No edema.  Neurologic: No gross sensory or motor deficits. No tremors or fasciculations noted.    DIAGNOSTIC DATA REVIEWED:  BMET    Component Value Date/Time   NA 140 04/19/2022 0918   K 4.5 04/19/2022 0918   CL 103 04/19/2022 0918   CO2  23 04/19/2022 0918   GLUCOSE 89 04/19/2022 0918   BUN 19 04/19/2022 0918   CREATININE 0.78 04/19/2022 0918   CALCIUM 9.9 04/19/2022 0918   GFRNONAA 91 07/04/2020 0833   GFRAA 105 07/04/2020 0833   Lab Results  Component Value Date   HGBA1C 5.4 04/19/2022   HGBA1C 5.1 05/20/2016   Lab Results  Component Value Date   INSULIN 9.9 04/19/2022   INSULIN 10.8 05/16/2018   Lab Results  Component Value Date   TSH 1.690 04/19/2022   CBC    Component Value Date/Time   WBC 6.3 10/15/2020 1016   RBC 4.78 10/15/2020 1016   HGB 14.1 10/15/2020 1016   HCT 43.7 10/15/2020 1016   PLT 352 10/15/2020 1016   MCV 91 10/15/2020 1016   MCH 29.5 10/15/2020 1016   MCHC 32.3 10/15/2020 1016   RDW 13.4 10/15/2020 1016   Iron Studies No results found for: "IRON", "TIBC", "FERRITIN", "IRONPCTSAT" Lipid Panel      Component Value Date/Time   CHOL 239 (H) 04/19/2022 0918   TRIG 54 04/19/2022 0918   HDL 85 04/19/2022 0918   CHOLHDL 3.2 10/15/2020 1016   LDLCALC 145 (H) 04/19/2022 0918   Hepatic Function Panel     Component Value Date/Time   PROT 7.6 04/19/2022 0918   ALBUMIN 4.8 04/19/2022 0918   AST 20 04/19/2022 0918   ALT 24 04/19/2022 0918   ALKPHOS 86 04/19/2022 0918   BILITOT 0.2 04/19/2022 0918      Component Value Date/Time   TSH 1.690 04/19/2022 0918   Nutritional Lab Results  Component Value Date   VD25OH 40.9 04/19/2022   VD25OH 51.6 03/31/2021   VD25OH 32.2 10/15/2020     ASSESSMENT AND PLAN  TREATMENT PLAN FOR OBESITY:  Recommended Dietary Goals  Andrea Alvarez is currently in the action stage of change. As such, her goal is to continue weight management plan. She has agreed to the Category 2 Plan.  Behavioral Intervention  We discussed the following Behavioral Modification Strategies today: increasing lean protein intake, decreasing simple carbohydrates , increasing vegetables, increase water intake, decrease eating out, work on meal planning and easy cooking plans, and think about ways to increase physical activity.  Additional resources provided today: NA  Recommended Physical Activity Goals  Andrea Alvarez has been advised to work up to 150 minutes of moderate intensity aerobic activity a week and strengthening exercises 2-3 times per week for cardiovascular health, weight loss maintenance and preservation of muscle mass.   She has agreed to increase physical activity in their day and reduce sedentary time (increase NEAT).  and continue physical activity as is.    Pharmacotherapy We discussed various medication options to help Andrea Alvarez with her weight loss efforts and we both agreed to continue Topamax 51m. Side effects discussed.  ASSOCIATED CONDITIONS ADDRESSED TODAY  Action/Plan  Polyphagia -     Continue Topiramate; Take 1 tablet (50 mg total) by mouth daily.   Dispense: 90 tablet; Refill: 0. Side effects discussed.   Other hyperlipidemia ASCVD reviewed with patient today.   Cardiovascular risk and specific lipid/LDL goals reviewed.  We discussed several lifestyle modifications today and Andrea Alvarez continue to work on diet, exercise and weight loss efforts. Orders and follow up as documented in patient record.   Counseling Intensive lifestyle modifications are the first line treatment for this issue. Dietary changes: Increase soluble fiber. Decrease simple carbohydrates. Exercise changes: Moderate to vigorous-intensity aerobic activity 150 minutes per week if tolerated. Lipid-lowering medications: see documented  in medical record.   Vitamin D insufficiency Continue Vit D as directed.  Needs DEXA.  To discuss with PCP.    Generalized obesity  BMI 31.0-31.9,adult    Will check labs at next visit   Return in about 3 months (around 12/06/2022).Marland Kitchen She was informed of the importance of frequent follow up visits to maximize her success with intensive lifestyle modifications for her multiple health conditions.   ATTESTASTION STATEMENTS:  Reviewed by clinician on day of visit: allergies, medications, problem list, medical history, surgical history, family history, social history, and previous encounter notes.     Ailene Rud. Dyshon Philbin FNP-C

## 2022-11-08 ENCOUNTER — Ambulatory Visit: Payer: Commercial Managed Care - PPO | Admitting: Nurse Practitioner

## 2022-11-13 ENCOUNTER — Other Ambulatory Visit: Payer: Self-pay | Admitting: Nurse Practitioner

## 2022-11-13 DIAGNOSIS — R632 Polyphagia: Secondary | ICD-10-CM

## 2023-09-19 ENCOUNTER — Telehealth: Payer: Self-pay | Admitting: Physician Assistant

## 2023-09-19 ENCOUNTER — Telehealth: Payer: Self-pay | Admitting: *Deleted

## 2023-09-19 ENCOUNTER — Telehealth: Payer: Self-pay | Admitting: Family Medicine

## 2023-09-19 NOTE — Telephone Encounter (Signed)
 Will have to be in a new patient appointment

## 2023-09-19 NOTE — Telephone Encounter (Signed)
 Not currently taking transfers

## 2023-09-19 NOTE — Telephone Encounter (Addendum)
 This is a duplicate request. Please see Dr. Royden Purl comments regarding this on duplicate phone note

## 2023-09-19 NOTE — Telephone Encounter (Signed)
 Copied from CRM [id number]. Topic: Appointments - Transfer of Care >> Sep 19, 2023 12:57 PM Rodman Pickle T wrote: Pt is requesting to transfer FROM: Dr.Drubel Pt is requesting to transfer TO: Dr.Tower Reason for requested transfer: patient moved and wanted to be seen in a office closer. It is the responsibility of the team the patient would like to transfer to (Dr.  Roxy Manns ) to reach out to the patient if for any reason this transfer is not acceptable.

## 2023-09-19 NOTE — Telephone Encounter (Signed)
 She should be a new patient. Is that ok? I updated visit type but wanted to check before calling and r/s.

## 2023-09-19 NOTE — Telephone Encounter (Unsigned)
 Copied from CRM [id number]. Topic: Appointments - Transfer of Care >> Sep 19, 2023 12:57 PM Rodman Pickle T wrote: Pt is requesting to transfer FROM: Dr.Drubel Pt is requesting to transfer TO: Dr.Tower Reason for requested transfer: patient moved and wanted to be seen in a office closer. It is the responsibility of the team the patient would like to transfer to (Dr.  Roxy Manns ) to reach out to the patient if for any reason this transfer is not acceptable.

## 2023-09-19 NOTE — Telephone Encounter (Signed)
 They have already scheduled pt on Dr. Royden Purl comments please r/s

## 2023-09-20 ENCOUNTER — Ambulatory Visit: Payer: Commercial Managed Care - PPO | Admitting: Family Medicine

## 2023-12-20 IMAGING — MG MM DIGITAL SCREENING BILAT W/ TOMO AND CAD
8 series · 8 of 24 positions shown · non-contrast
Comparison: Previous exam(s).

CLINICAL DATA: Screening.

EXAM:
DIGITAL SCREENING BILATERAL MAMMOGRAM WITH TOMOSYNTHESIS AND CAD
TECHNIQUE: Bilateral screening digital craniocaudal and mediolateral oblique
mammograms were obtained. Bilateral screening digital breast
tomosynthesis was performed. The images were evaluated with
computer-aided detection.

[L MLO synth-2D]
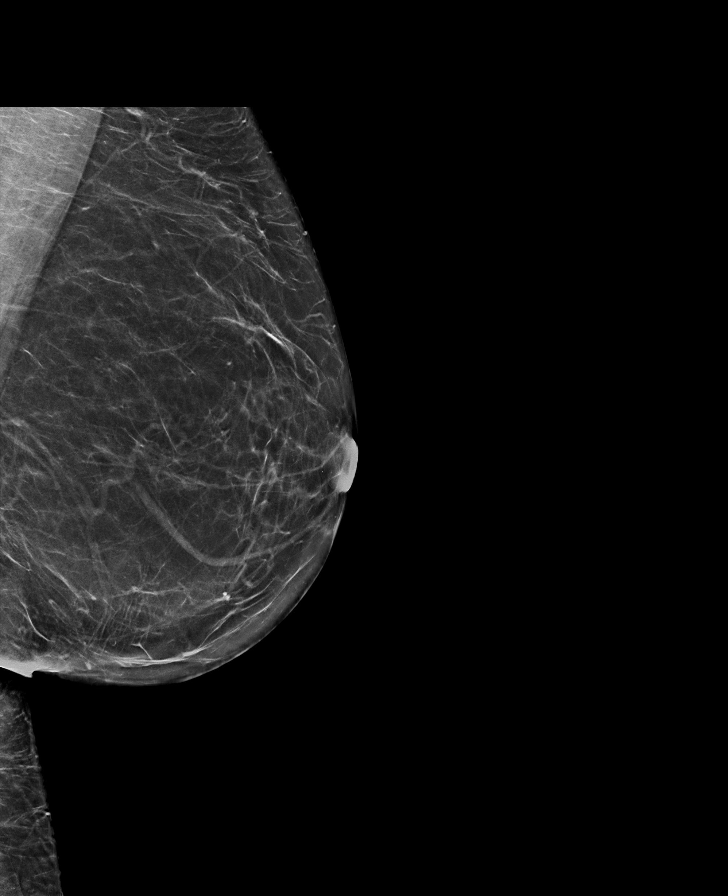

[L CC synth-2D]
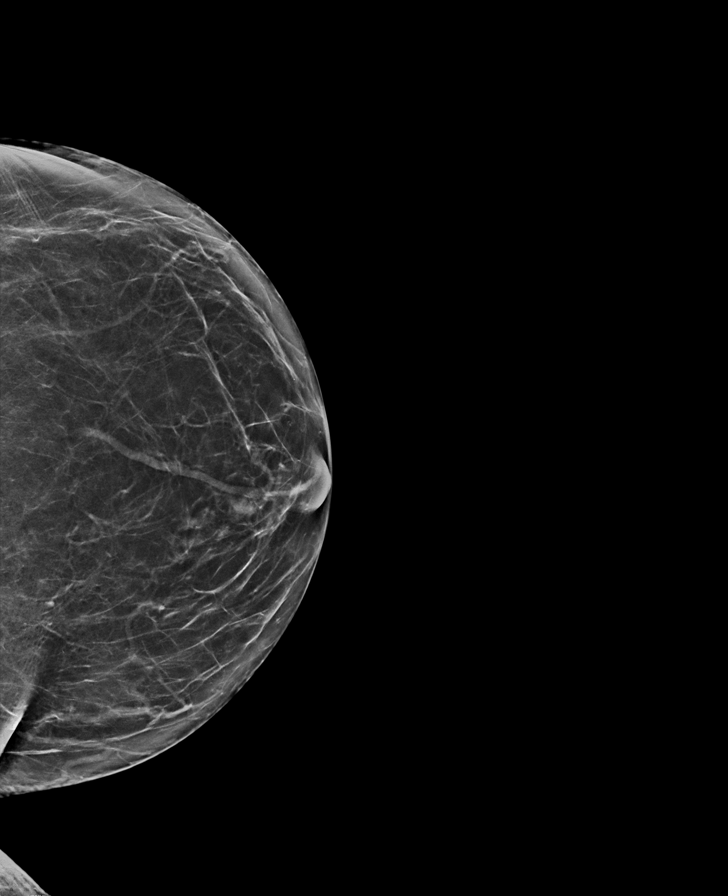

[R CC synth-2D]
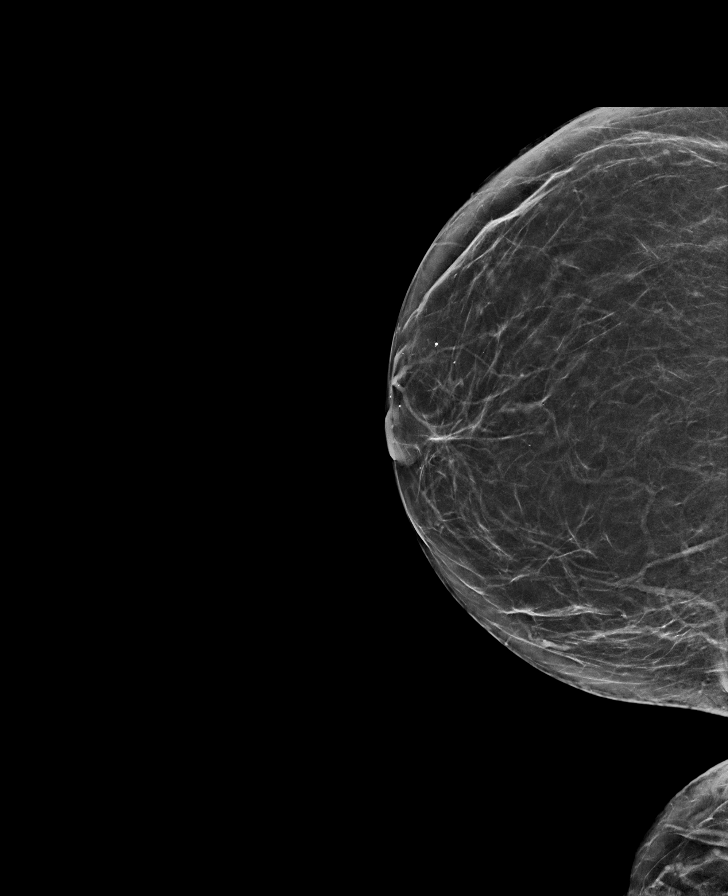

[R MLO synth-2D]
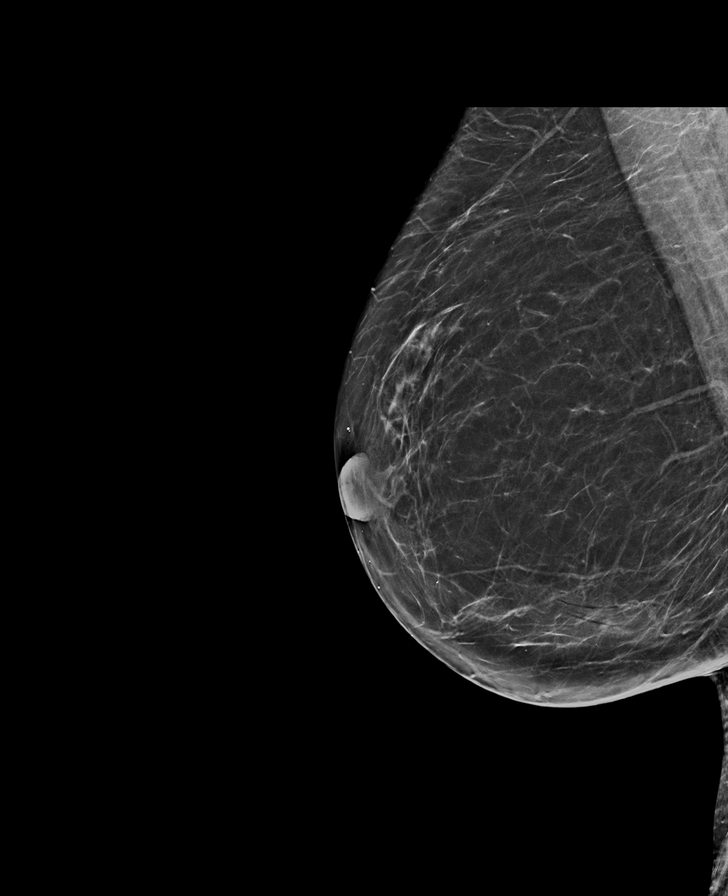

[L CC tomo · tomo slice 33/64.0]
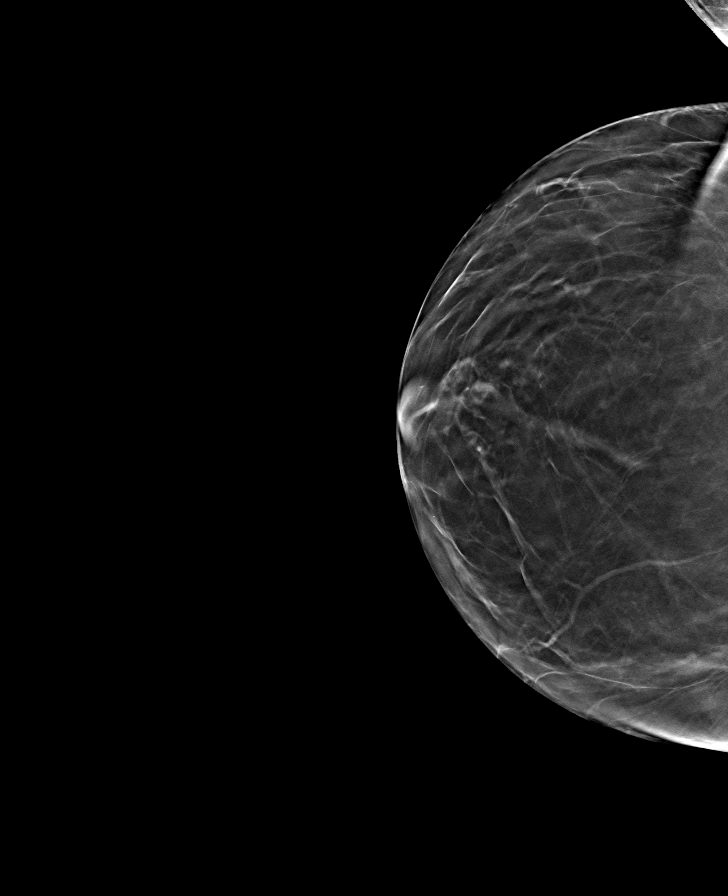

[R CC tomo · tomo slice 31/60.0]
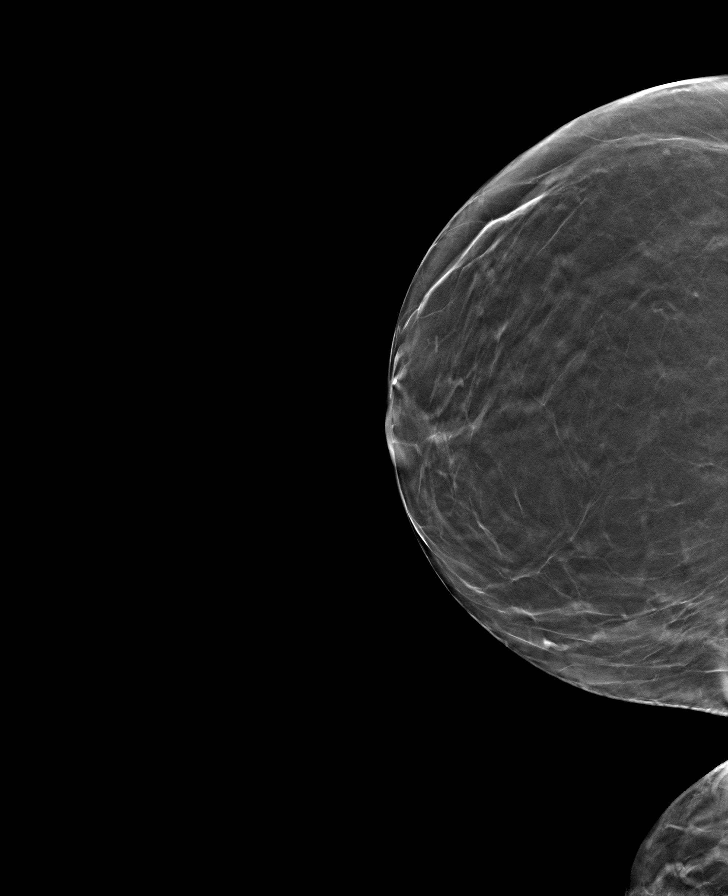

[L MLO tomo · tomo slice 35/69.0]
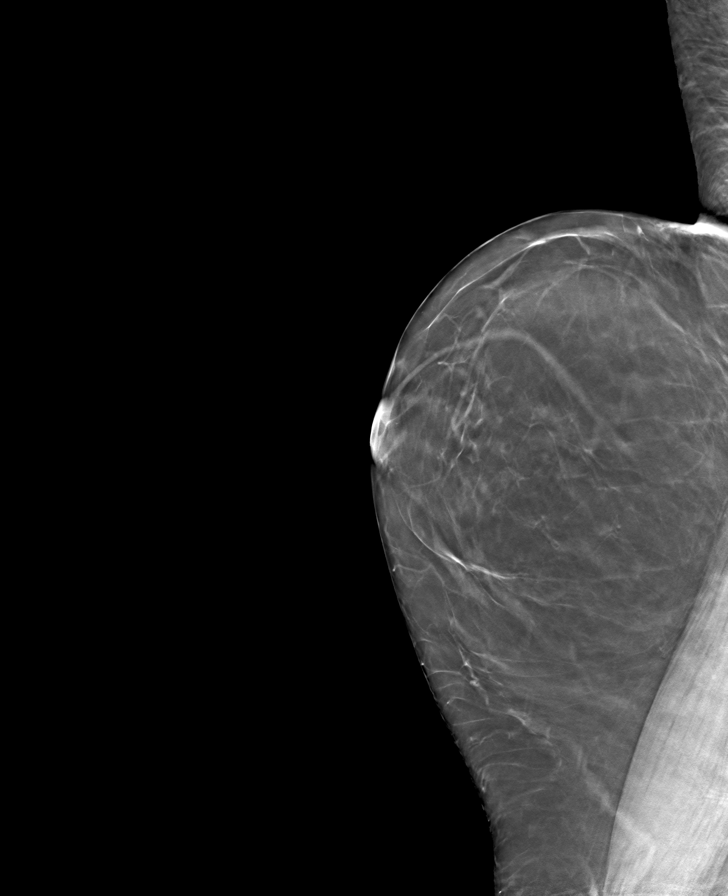

[R MLO tomo · tomo slice 33/64.0]
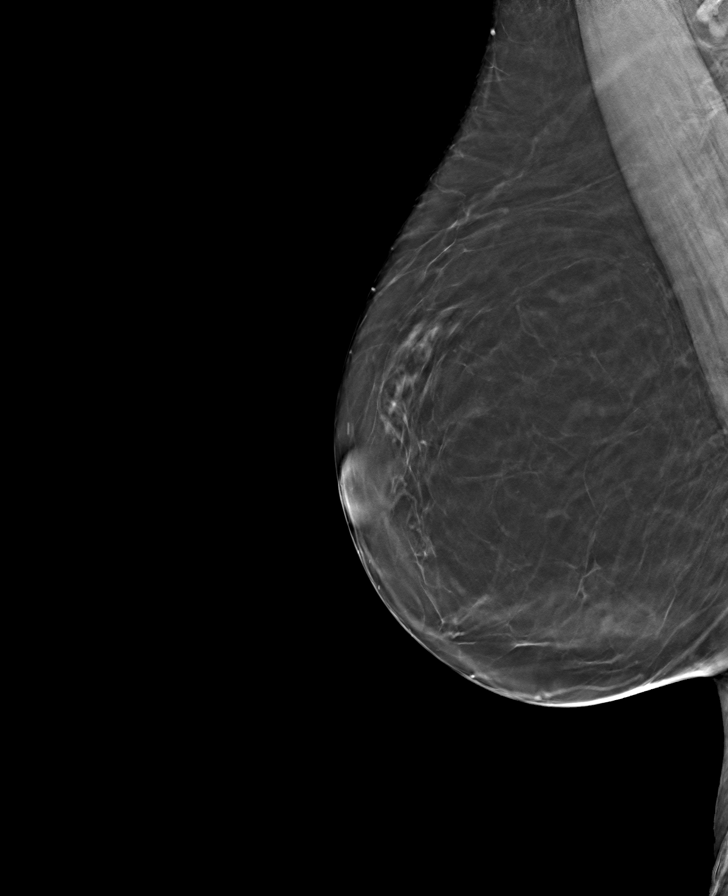

[8 of 24 positions shown; findings below may reference images not displayed]

ACR Breast Density Category b: There are scattered areas of
fibroglandular density.
FINDINGS: There are no findings suspicious for malignancy.
IMPRESSION: No mammographic evidence of malignancy. A result letter of this
screening mammogram will be mailed directly to the patient.

RECOMMENDATION:
Screening mammogram in one year. (Code:51-O-LD2)

BI-RADS CATEGORY  1: Negative.

## 2024-02-27 ENCOUNTER — Encounter: Payer: Self-pay | Admitting: Family Medicine

## 2024-02-27 ENCOUNTER — Ambulatory Visit (INDEPENDENT_AMBULATORY_CARE_PROVIDER_SITE_OTHER)
Admission: RE | Admit: 2024-02-27 | Discharge: 2024-02-27 | Disposition: A | Discharge status: Home / Self Care | Source: Ambulatory Visit | Attending: Family Medicine | Admitting: Family Medicine

## 2024-02-27 ENCOUNTER — Ambulatory Visit (INDEPENDENT_AMBULATORY_CARE_PROVIDER_SITE_OTHER): Admitting: Family Medicine

## 2024-02-27 ENCOUNTER — Ambulatory Visit: Payer: Self-pay | Admitting: Family Medicine

## 2024-02-27 VITALS — BP 130/82 | HR 66 | Temp 98.2°F | Ht 61.5 in | Wt 179.1 lb

## 2024-02-27 DIAGNOSIS — E88819 Insulin resistance, unspecified: Secondary | ICD-10-CM

## 2024-02-27 DIAGNOSIS — M79644 Pain in right finger(s): Secondary | ICD-10-CM

## 2024-02-27 DIAGNOSIS — Z6830 Body mass index (BMI) 30.0-30.9, adult: Secondary | ICD-10-CM

## 2024-02-27 DIAGNOSIS — I10 Essential (primary) hypertension: Secondary | ICD-10-CM | POA: Diagnosis not present

## 2024-02-27 DIAGNOSIS — Z1231 Encounter for screening mammogram for malignant neoplasm of breast: Secondary | ICD-10-CM | POA: Diagnosis not present

## 2024-02-27 DIAGNOSIS — F3289 Other specified depressive episodes: Secondary | ICD-10-CM

## 2024-02-27 DIAGNOSIS — E559 Vitamin D deficiency, unspecified: Secondary | ICD-10-CM

## 2024-02-27 DIAGNOSIS — E7849 Other hyperlipidemia: Secondary | ICD-10-CM

## 2024-02-27 NOTE — Patient Instructions (Addendum)
 Stay active  Keep walking and doing videos   Schedule you annual exam at check out with fasting labs prior   Add some strength training to your routine, this is important for bone and brain health and can reduce your risk of falls and help your body use insulin  properly and regulate weight  Light weights, exercise bands , and internet videos are a good way to start  Yoga (chair or regular), machines , floor exercises or a gym with machines are also good options    Let's check an xray of your hand/finger  We will reach out with results If triggering gets worse let us  know      You have an order for:  [x]   3D Mammogram  []   Bone Density     Please call for appointment:   [x]   [facility]  [address] [place] [address]  [phone]  []   [facility])   [address]  [place], [place] [address]  [phone]  []   [facility]      [address] [place], [place]        [phone]         []   [facility]  [address] [place], [place]  [phone]  []  [facility]   [address]   [place], [place] [address]  [phone]  []  [facility]  [address] [place], [place] [address] [phone]    Make sure to wear two piece clothing  No lotions powders or deodorants the day of the appointment Make sure to bring picture ID and insurance card.  Bring list of medications you are currently taking including any supplements.   Schedule your screening mammogram through MyChart!   Select [place] imaging sites can now be scheduled through MyChart.  Log into your MyChart account.  Go to 'Visit' (or 'Appointments' if  on mobile App) --> Schedule an  Appointment  Under 'Select a Reason for Visit' choose the  Mammogram  Screening option.  Complete the pre-visit questions  and select the time and place that  best fits your schedule

## 2024-02-27 NOTE — Assessment & Plan Note (Signed)
 Right 3rd finger becomes stuck (triggering) and some stiffness and soreness at times No history of trauma   Reassuring exam Did not reproduce trigger   Xray ordered  Arthritis and tendonitis in the differential   Encouraged use of cold compress/ice Avoid reped movements that hurt

## 2024-02-27 NOTE — Assessment & Plan Note (Signed)
 Discussed how this problem influences overall health and the risks it imposes  Reviewed plan for weight loss with lower calorie diet (via better food choices (lower glycemic and portion control) along with exercise building up to or more than 30 minutes 5 days per week including some aerobic activity and strength training   Lost weight successfully with healthy weight clinic and glp-1  Can no longer afford medication/no longer compounded Taking bupropion and naltrexone from weight watchers  Encouraged to add more strength building exercise

## 2024-02-27 NOTE — Assessment & Plan Note (Signed)
 BP: 130/82  No medicines currently  Managing with lifestyle change    No changes needed Most recent labs reviewed  Disc lifstyle change with low sodium diet and exercise

## 2024-02-27 NOTE — Progress Notes (Signed)
 Subjective:    Patient ID: Andrea Alvarez, female    DOB: 1965/11/07, 58 y.o.   MRN: [id number]  HPI  Wt Readings from Last 3 Encounters:  02/27/24 179 lb 2 oz (81.3 kg)  09/07/22 172 lb (78 kg)  08/05/22 174 lb (78.9 kg)   33.30 kg/m  Vitals:   02/27/24 1402  BP: 130/82  Pulse: 66  Temp: 98.2 F (36.8 C)  SpO2: 99%    Pt presents to establish for primary care  Previously saw Manuelita Flatness PA  Lives close by   Follow up of chronic health problems   Due for mammogram  Also wants to address pain in a finger  Is right handed  Right middle finger  Gets stuck bend and has to manually pop it back  Now it throbs at times ? Arthritis  Gets swollen also     Chart notes:  Past obesity with history of intentional weight loss -[facility]  In past Does weight watchers -a practitioner prescribes her medications    Bupropion / naltrexone now - does not work as well  Past saxsenda -had to stop glp-1 due to cost  Past topamax  -did not help her  Just increased the dosage - adjusting to that   Exercise-used the [employer]   History of HTN bp is stable today  No cp or palpitations or headaches or edema  No side effects to medicines  BP Readings from Last 3 Encounters:  02/27/24 130/82  09/07/22 138/69  08/05/22 (!) 140/69     Lab Results  Component Value Date   NA 140 04/19/2022   K 4.5 04/19/2022   CO2 23 04/19/2022   GLUCOSE 89 04/19/2022   BUN 19 04/19/2022   CREATININE 0.78 04/19/2022   CALCIUM 9.9 04/19/2022   EGFR 89 04/19/2022   GFRNONAA 91 07/04/2020    History of insulin  resistance Lab Results  Component Value Date   HGBA1C 5.4 04/19/2022   HGBA1C 5.3 03/31/2021   HGBA1C 5.2 07/04/2020   History of hyperlipidemia Lab Results  Component Value Date   CHOL 239 (H) 04/19/2022   HDL 85 04/19/2022   LDLCALC 145 (H) 04/19/2022   TRIG 54 04/19/2022   CHOLHDL 3.2 10/15/2020   History of  D def Last vitamin D  Lab Results  Component Value Date   VD25OH 40.9 04/19/2022   Family history  Mother had melanoma and dementia   Does got to the dermatologist      02/27/2024    2:12 PM 08/18/2021    3:07 PM 10/15/2020    9:15 AM 07/04/2020    7:38 AM 07/02/2019   10:14 AM  Depression screen PHQ 2/9  Decreased Interest 0 0 1 0 0  Down, Depressed, Hopeless 0 0 0 0 0  PHQ - 2 Score 0 0 1 0 0  Altered sleeping 1 0 1    Tired, decreased energy 1 0 3    Change in appetite 2 0 1    Feeling bad or failure about yourself  0 0 0    Trouble concentrating 0 0 0    Moving slowly or fidgety/restless 0 0 0    Suicidal thoughts 0 0 0    PHQ-9 Score 4 0 6    Difficult doing work/chores Not difficult at all Not difficult at all Not difficult at all        Patient Active Problem List   Diagnosis Date  Noted   Encounter for screening mammogram for breast cancer 02/27/2024   Finger pain, right 02/27/2024   Chronic constipation 10/15/2020   Chronic back pain 10/15/2020   At risk for heart disease 10/15/2020   Depression 10/15/2020   Chest pain 02/12/2020   Benign essential HTN 02/12/2020   Insulin  resistance 08/28/2018   Other hyperlipidemia 08/14/2018   Vitamin D  insufficiency 07/27/2018   BMI 30.0-30.9,adult 03/25/2016   Acid reflux 05/13/2015   Headache, migraine 05/13/2015   Fecal occult blood test positive 05/13/2015   Past Medical History:  Diagnosis Date   Anemia    Back pain    Constipation    GERD (gastroesophageal reflux disease)    Joint pain    Leg edema    Obesity    SOBOE (shortness of breath on exertion)    Swelling of both lower extremities    Vitamin D  deficiency    Past Surgical History:  Procedure Laterality Date   BREAST SURGERY  02/1999   status post bilateral breast reduction   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   Dr.Madison Clark   REDUCTION MAMMAPLASTY Bilateral 1999   TUBAL LIGATION  1989   Social History   Tobacco Use    Smoking status: Never   Smokeless tobacco: Never  Vaping Use   Vaping status: Never Used  Substance Use Topics   Alcohol use: Yes    Comment: Occasional alcohol use; once or twice a year   Drug use: No   Family History  Problem Relation Age of Onset   Hypertension Mother    Cancer Mother    Cancer Father        bladder cancer   Asthma Sister    Heart disease Sister    Coronary artery disease Maternal Grandmother    Coronary artery disease Paternal Grandmother    Heart attack Paternal Grandmother    Asthma Paternal Grandmother    Colon cancer Neg Hx    Colon polyps Neg Hx    Esophageal cancer Neg Hx    Rectal cancer Neg Hx    Stomach cancer Neg Hx    No Known Allergies Current Outpatient Medications on File Prior to Visit  Medication Sig Dispense Refill   buPROPion (WELLBUTRIN XL) 300 MG 24 hr tablet Take 300 mg by mouth daily.     NALTREXONE HCL PO Take 100 mg by mouth daily.     No current facility-administered medications on file prior to visit.    Review of Systems  Constitutional:  Negative for activity change, appetite change, fatigue, fever and unexpected weight change.  HENT:  Negative for congestion, ear pain, rhinorrhea, sinus pressure and sore throat.   Eyes:  Negative for pain, redness and visual disturbance.  Respiratory:  Negative for cough, shortness of breath and wheezing.   Cardiovascular:  Negative for chest pain and palpitations.  Gastrointestinal:  Negative for abdominal pain, blood in stool, constipation and diarrhea.  Endocrine: Negative for polydipsia and polyuria.  Genitourinary:  Negative for dysuria, frequency and urgency.  Musculoskeletal:  Positive for arthralgias. Negative for back pain and myalgias.  Skin:  Negative for pallor and rash.  Allergic/Immunologic: Negative for environmental allergies.  Neurological:  Negative for dizziness, syncope and headaches.  Hematological:  Negative for adenopathy. Does not bruise/bleed easily.   Psychiatric/Behavioral:  Negative for decreased concentration and dysphoric mood. The patient is not nervous/anxious.        Objective:   Physical Exam Constitutional:      General: She is  not in acute distress.    Appearance: Normal appearance. She is well-developed. She is obese. She is not ill-appearing or diaphoretic.  HENT:     Head: Normocephalic and atraumatic.  Eyes:     Conjunctiva/sclera: Conjunctivae normal.     Pupils: Pupils are equal, round, and reactive to light.  Neck:     Thyroid: No thyromegaly.     Vascular: No carotid bruit or JVD.  Cardiovascular:     Rate and Rhythm: Normal rate and regular rhythm.     Pulses: Normal pulses.     Heart sounds: Normal heart sounds.     No gallop.  Pulmonary:     Effort: Pulmonary effort is normal. No respiratory distress.     Breath sounds: Normal breath sounds. No wheezing or rales.  Abdominal:     General: There is no distension or abdominal bruit.     Palpations: Abdomen is soft.  Musculoskeletal:     Cervical back: Normal range of motion and neck supple.     Right lower leg: No edema.     Left lower leg: No edema.     Comments: Right hand  Some tenderness of middle pip joint - mild swelling (no warmth or skin change) Normal rom with discomfort Unable to reproduce triggering   Lymphadenopathy:     Cervical: No cervical adenopathy.  Skin:    General: Skin is warm and dry.     Coloration: Skin is not pale.     Findings: No rash.  Neurological:     Mental Status: She is alert.     Coordination: Coordination normal.     Deep Tendon Reflexes: Reflexes are normal and symmetric. Reflexes normal.  Psychiatric:        Mood and Affect: Mood normal.           Assessment & Plan:   Problem List Items Addressed This Visit       Cardiovascular and Mediastinum   Benign essential HTN   BP: 130/82  No medicines currently  Managing with lifestyle change    No changes needed Most recent labs reviewed  Disc  lifstyle change with low sodium diet and exercise          Endocrine   Insulin  resistance   Pt lost weight on glp-1 before it was no longer covered  Now weight watchers Will order A1c for upcoming labs disc imp of low glycemic diet and wt loss to prevent DM2         Other   Vitamin D  insufficiency   D level ordered for upcoming labs  Important for bone and overall health      Other hyperlipidemia   Disc goals for lipids and reasons to control them Rev last labs with pt Rev low sat fat diet in detail Labs ordered for upcoming labs       Finger pain, right - Primary   Right 3rd finger becomes stuck (triggering) and some stiffness and soreness at times No history of trauma   Reassuring exam Did not reproduce trigger   Xray ordered  Arthritis and tendonitis in the differential   Encouraged use of cold compress/ice Avoid reped movements that hurt        Relevant Orders   DG Hand Complete Right (Completed)   Encounter for screening mammogram for breast cancer   Mammogram ordered Pt will call to schedule       Relevant Orders   MM 3D SCREENING MAMMOGRAM BILATERAL BREAST  Depression   Currently takes bupropion  Also for weight loss  PHQ 4         Relevant Medications   buPROPion (WELLBUTRIN XL) 300 MG 24 hr tablet   BMI 30.0-30.9,adult   Discussed how this problem influences overall health and the risks it imposes  Reviewed plan for weight loss with lower calorie diet (via better food choices (lower glycemic and portion control) along with exercise building up to or more than 30 minutes 5 days per week including some aerobic activity and strength training   Lost weight successfully with healthy weight clinic and glp-1  Can no longer afford medication/no longer compounded Taking bupropion and naltrexone from weight watchers  Encouraged to add more strength building exercise

## 2024-02-27 NOTE — Assessment & Plan Note (Signed)
 D level ordered for upcoming labs  Important for bone and overall health

## 2024-02-27 NOTE — Assessment & Plan Note (Signed)
 Currently takes bupropion  Also for weight loss  PHQ 4

## 2024-02-27 NOTE — Assessment & Plan Note (Signed)
 Disc goals for lipids and reasons to control them Rev last labs with pt Rev low sat fat diet in detail Labs ordered for upcoming labs

## 2024-02-27 NOTE — Assessment & Plan Note (Signed)
 Pt lost weight on glp-1 before it was no longer covered  Now weight watchers Will order A1c for upcoming labs disc imp of low glycemic diet and wt loss to prevent DM2

## 2024-02-27 NOTE — Assessment & Plan Note (Signed)
 Mammogram ordered Pt will call to schedule

## 2024-03-28 ENCOUNTER — Other Ambulatory Visit (INDEPENDENT_AMBULATORY_CARE_PROVIDER_SITE_OTHER)

## 2024-03-28 DIAGNOSIS — E7849 Other hyperlipidemia: Secondary | ICD-10-CM

## 2024-03-28 DIAGNOSIS — E559 Vitamin D deficiency, unspecified: Secondary | ICD-10-CM | POA: Diagnosis not present

## 2024-03-28 DIAGNOSIS — E88819 Insulin resistance, unspecified: Secondary | ICD-10-CM

## 2024-03-28 DIAGNOSIS — I10 Essential (primary) hypertension: Secondary | ICD-10-CM | POA: Diagnosis not present

## 2024-03-28 LAB — LIPID PANEL
Cholesterol: 230 mg/dL — ABNORMAL HIGH (ref 0–200)
HDL: 78.8 mg/dL (ref >39.00)
LDL Cholesterol: 140 mg/dL — ABNORMAL HIGH (ref 0–99)
NonHDL: 151.27
Total CHOL/HDL Ratio: 3
Triglycerides: 56 mg/dL (ref 0.0–149.0)
VLDL: 11.2 mg/dL (ref 0.0–40.0)

## 2024-03-28 LAB — CBC WITH DIFFERENTIAL/PLATELET
Basophils Absolute: 0.1 K/uL (ref 0.0–0.1)
Basophils Relative: 1.4 % (ref 0.0–3.0)
Eosinophils Absolute: 0.1 K/uL (ref 0.0–0.7)
Eosinophils Relative: 3 % (ref 0.0–5.0)
HCT: 40.7 % (ref 36.0–46.0)
Hemoglobin: 13.4 g/dL (ref 12.0–15.0)
Lymphocytes Relative: 29.6 % (ref 12.0–46.0)
Lymphs Abs: 1.4 K/uL (ref 0.7–4.0)
MCHC: 33 g/dL (ref 30.0–36.0)
MCV: 90.6 fl (ref 78.0–100.0)
Monocytes Absolute: 0.5 K/uL (ref 0.1–1.0)
Monocytes Relative: 10.8 % (ref 3.0–12.0)
Neutro Abs: 2.6 K/uL (ref 1.4–7.7)
Neutrophils Relative %: 55.2 % (ref 43.0–77.0)
Platelets: 301 K/uL (ref 150.0–400.0)
RBC: 4.49 Mil/uL (ref 3.87–5.11)
RDW: 13.4 % (ref 11.5–15.5)
WBC: 4.8 K/uL (ref 4.0–10.5)

## 2024-03-28 LAB — COMPREHENSIVE METABOLIC PANEL WITH GFR
ALT: 18 U/L (ref 0–35)
AST: 16 U/L (ref 0–37)
Albumin: 4.4 g/dL (ref 3.5–5.2)
Alkaline Phosphatase: 81 U/L (ref 39–117)
BUN: 16 mg/dL (ref 6–23)
CO2: 31 meq/L (ref 19–32)
Calcium: 9.6 mg/dL (ref 8.4–10.5)
Chloride: 101 meq/L (ref 96–112)
Creatinine, Ser: 0.8 mg/dL (ref 0.40–1.20)
GFR: 81.2 mL/min (ref >60.00)
Glucose, Bld: 89 mg/dL (ref 70–99)
Potassium: 4.6 meq/L (ref 3.5–5.1)
Sodium: 139 meq/L (ref 135–145)
Total Bilirubin: 0.4 mg/dL (ref 0.2–1.2)
Total Protein: 7.5 g/dL (ref 6.0–8.3)

## 2024-03-28 LAB — TSH: TSH: 2.21 u[IU]/mL (ref 0.35–5.50)

## 2024-03-28 LAB — VITAMIN D 25 HYDROXY (VIT D DEFICIENCY, FRACTURES): VITD: 27.37 ng/mL — ABNORMAL LOW (ref 30.00–100.00)

## 2024-03-28 LAB — HEMOGLOBIN A1C: Hgb A1c MFr Bld: 5.6 % (ref 4.6–6.5)

## 2024-04-04 ENCOUNTER — Other Ambulatory Visit (HOSPITAL_COMMUNITY)
Admission: RE | Admit: 2024-04-04 | Discharge: 2024-04-04 | Disposition: A | Discharge status: Home / Self Care | Source: Ambulatory Visit | Attending: Family Medicine | Admitting: Family Medicine

## 2024-04-04 ENCOUNTER — Ambulatory Visit: Admitting: Family Medicine

## 2024-04-04 ENCOUNTER — Encounter: Payer: Self-pay | Admitting: Family Medicine

## 2024-04-04 VITALS — BP 124/82 | HR 67 | Temp 97.8°F | Ht 61.25 in | Wt 183.1 lb

## 2024-04-04 DIAGNOSIS — Z01419 Encounter for gynecological examination (general) (routine) without abnormal findings: Secondary | ICD-10-CM | POA: Insufficient documentation

## 2024-04-04 DIAGNOSIS — Z124 Encounter for screening for malignant neoplasm of cervix: Secondary | ICD-10-CM | POA: Diagnosis not present

## 2024-04-04 DIAGNOSIS — Z23 Encounter for immunization: Secondary | ICD-10-CM | POA: Diagnosis not present

## 2024-04-04 DIAGNOSIS — I10 Essential (primary) hypertension: Secondary | ICD-10-CM

## 2024-04-04 DIAGNOSIS — Z1231 Encounter for screening mammogram for malignant neoplasm of breast: Secondary | ICD-10-CM

## 2024-04-04 DIAGNOSIS — Z808 Family history of malignant neoplasm of other organs or systems: Secondary | ICD-10-CM | POA: Insufficient documentation

## 2024-04-04 DIAGNOSIS — Z6834 Body mass index (BMI) 34.0-34.9, adult: Secondary | ICD-10-CM

## 2024-04-04 DIAGNOSIS — E559 Vitamin D deficiency, unspecified: Secondary | ICD-10-CM | POA: Diagnosis not present

## 2024-04-04 DIAGNOSIS — E7849 Other hyperlipidemia: Secondary | ICD-10-CM | POA: Diagnosis not present

## 2024-04-04 DIAGNOSIS — R638 Other symptoms and signs concerning food and fluid intake: Secondary | ICD-10-CM

## 2024-04-04 DIAGNOSIS — F3289 Other specified depressive episodes: Secondary | ICD-10-CM

## 2024-04-04 DIAGNOSIS — E88819 Insulin resistance, unspecified: Secondary | ICD-10-CM

## 2024-04-04 DIAGNOSIS — Z0000 Encounter for general adult medical examination without abnormal findings: Secondary | ICD-10-CM | POA: Diagnosis not present

## 2024-04-04 NOTE — Assessment & Plan Note (Signed)
 BP: 124/82  No medicines currently  Managing with lifestyle change    No changes needed Most recent labs reviewed  Disc lifstyle change with low sodium diet and exercise

## 2024-04-04 NOTE — Assessment & Plan Note (Signed)
Routine exam and pap  No complaints

## 2024-04-04 NOTE — Assessment & Plan Note (Signed)
 Discussed how this problem influences overall health and the risks it imposes  Reviewed plan for weight loss with lower calorie diet (via better food choices (lower glycemic and portion control) along with exercise building up to or more than 30 minutes 5 days per week including some aerobic activity and strength training   See a/p for abnormal appetite Discussed small steps/ cutting out more processed food   Insurance no longer paying for glp-1 or weight clinic

## 2024-04-04 NOTE — Assessment & Plan Note (Signed)
 Lab Results  Component Value Date   HGBA1C 5.6 03/28/2024   HGBA1C 5.4 04/19/2022   HGBA1C 5.3 03/31/2021   disc imp of low glycemic diet and wt loss to prevent DM2

## 2024-04-04 NOTE — Progress Notes (Signed)
 Subjective:    Patient ID: Andrea Alvarez, female    DOB: May 29, 1966, 58 y.o.   MRN: [id number]  HPI  Here for health maintenance exam and to review chronic medical problems   Wt Readings from Last 3 Encounters:  04/04/24 183 lb 2 oz (83.1 kg)  02/27/24 179 lb 2 oz (81.3 kg)  09/07/22 172 lb (78 kg)   34.32 kg/m  Vitals:   04/04/24 1123  BP: 124/82  Pulse: 67  Temp: 97.8 F (36.6 C)  SpO2: 98%    Immunization History  Administered Date(s) Administered   Influenza Split 05/13/2010   Influenza, Seasonal, Injecte, Preservative Fre 04/04/2024   Influenza,inj,Quad PF,6+ Mos 05/13/2014, 05/16/2015, 04/23/2016, 04/28/2018, 07/02/2019, 07/04/2020   Influenza-Unspecified 04/14/2021   PFIZER(Purple Top)SARS-COV-2 Vaccination 10/29/2019, 11/19/2019, 07/04/2020   Tdap 05/13/2014, 08/06/2020    Health Maintenance Due  Topic Date Due   Hepatitis C Screening  Never done   Hepatitis B Vaccines 19-59 Average Risk (1 of 3 - 19+ 3-dose series) Never done   Pneumococcal Vaccine: 50+ Years (1 of 1 - PCV) Never done   Zoster Vaccines- Shingrix (1 of 2) Never done   Cervical Cancer Screening (HPV/Pap Cotest)  02/02/2023   Mammogram  09/08/2023   Flu shot today   Shingrix - may be interested   Mammogram 2023-order is in /pt needs to schedule Self breast exam no lumps   Gyn health Normal pap 01/2028 Menopause at 50 No problems  Due for pap    Colon cancer screening -colonoscopy 12/ 2017 with 10 y recall   Bone health   Falls- one fall on uneven sidewalk / no fractures  Fractures-none  Supplements - went up on the vitamin D  to 4000 international units  Last vitamin D  Lab Results  Component Value Date   VD25OH 27.37 (L) 03/28/2024    Exercise  Using walking videos  Exercise bands   Mother had melanoma    Mood    04/04/2024   11:26 AM 02/27/2024    2:12 PM 08/18/2021    3:07 PM 10/15/2020    9:15 AM 07/04/2020    7:38 AM  Depression screen PHQ 2/9  Decreased  Interest 0 0 0 1 0  Down, Depressed, Hopeless 0 0 0 0 0  PHQ - 2 Score 0 0 0 1 0  Altered sleeping 1 1 0 1   Tired, decreased energy 2 1 0 3   Change in appetite 2 2 0 1   Feeling bad or failure about yourself  0 0 0 0   Trouble concentrating 0 0 0 0   Moving slowly or fidgety/restless 0 0 0 0   Suicidal thoughts 0 0 0 0   PHQ-9 Score 5 4 0 6   Difficult doing work/chores Not difficult at all Not difficult at all Not difficult at all Not difficult at all    Takes Bupropion     Obesity (ins no longer pays for healthy weight center of glp-1)  Continues to gain weight on naltrexone  On wellbutrin also    Eats lean protein  Lot of grapes  Starchy vegetable  No soda   Occational white potato  Some rice  Does crave sweets-too much   Appetite is the problem Always hungry     HTN bp is stable today  No cp or palpitations or headaches or edema  No side effects to medicines  BP Readings from Last 3 Encounters:  04/04/24 124/82  02/27/24 130/82  09/07/22  138/69    No medicines / treated with lifestyle change   Lab Results  Component Value Date   NA 139 03/28/2024   K 4.6 03/28/2024   CO2 31 03/28/2024   GLUCOSE 89 03/28/2024   BUN 16 03/28/2024   CREATININE 0.80 03/28/2024   CALCIUM 9.6 03/28/2024   GFR 81.20 03/28/2024   EGFR 89 04/19/2022   GFRNONAA 91 07/04/2020     Hyperlipidemia Lab Results  Component Value Date   CHOL 230 (H) 03/28/2024   CHOL 239 (H) 04/19/2022   CHOL 239 (H) 03/31/2021   Lab Results  Component Value Date   HDL 78.80 03/28/2024   HDL 85 04/19/2022   HDL 68 03/31/2021   Lab Results  Component Value Date   LDLCALC 140 (H) 03/28/2024   LDLCALC 145 (H) 04/19/2022   LDLCALC 158 (H) 03/31/2021   Lab Results  Component Value Date   TRIG 56.0 03/28/2024   TRIG 54 04/19/2022   TRIG 75 03/31/2021   Lab Results  Component Value Date   CHOLHDL 3 03/28/2024   CHOLHDL 3.2 10/15/2020   CHOLHDL 2.9 07/04/2020   No results  found for: LDLDIRECT No fried foods No red meat    History of insulin  resistance Lab Results  Component Value Date   HGBA1C 5.6 03/28/2024   HGBA1C 5.4 04/19/2022   HGBA1C 5.3 03/31/2021   Lab Results  Component Value Date   ALT 18 03/28/2024   AST 16 03/28/2024   ALKPHOS 81 03/28/2024   BILITOT 0.4 03/28/2024   Lab Results  Component Value Date   WBC 4.8 03/28/2024   HGB 13.4 03/28/2024   HCT 40.7 03/28/2024   MCV 90.6 03/28/2024   PLT 301.0 03/28/2024   Lab Results  Component Value Date   TSH 2.21 03/28/2024     Patient Active Problem List   Diagnosis Date Noted   Encounter for routine gynecological examination 04/04/2024   Family history of melanoma 04/04/2024   Abnormal food appetite 04/04/2024   Need for influenza vaccination 04/04/2024   Routine general medical examination at a health care facility 04/04/2024   Encounter for screening mammogram for breast cancer 02/27/2024   Finger pain, right 02/27/2024   Chronic constipation 10/15/2020   Chronic back pain 10/15/2020   At risk for heart disease 10/15/2020   Depression 10/15/2020   Benign essential HTN 02/12/2020   Insulin  resistance 08/28/2018   Other hyperlipidemia 08/14/2018   Vitamin D  insufficiency 07/27/2018   Body mass index (BMI) 34.0-34.9, adult 03/25/2016   Acid reflux 05/13/2015   Headache, migraine 05/13/2015   Past Medical History:  Diagnosis Date   Anemia    Back pain    Constipation    GERD (gastroesophageal reflux disease)    Joint pain    Leg edema    Obesity    SOBOE (shortness of breath on exertion)    Swelling of both lower extremities    Vitamin D  deficiency    Past Surgical History:  Procedure Laterality Date   BREAST SURGERY  02/1999   status post bilateral breast reduction   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   Dr.Madison Clark   REDUCTION MAMMAPLASTY Bilateral 1999   TUBAL LIGATION  1989   Social History   Tobacco Use   Smoking status: Never    Smokeless tobacco: Never  Vaping Use   Vaping status: Never Used  Substance Use Topics   Alcohol use: Yes    Comment: Occasional alcohol use; once or twice a  year   Drug use: No   Family History  Problem Relation Age of Onset   Hypertension Mother    Cancer Mother    Cancer Father        bladder cancer   Asthma Sister    Heart disease Sister    Coronary artery disease Maternal Grandmother    Coronary artery disease Paternal Grandmother    Heart attack Paternal Grandmother    Asthma Paternal Grandmother    Colon cancer Neg Hx    Colon polyps Neg Hx    Esophageal cancer Neg Hx    Rectal cancer Neg Hx    Stomach cancer Neg Hx    No Known Allergies Current Outpatient Medications on File Prior to Visit  Medication Sig Dispense Refill   buPROPion (WELLBUTRIN XL) 300 MG 24 hr tablet Take 300 mg by mouth daily.     cholecalciferol (VITAMIN D3) 25 MCG (1000 UNIT) tablet Take 4,000 Units by mouth daily.     No current facility-administered medications on file prior to visit.    Review of Systems  Constitutional:  Negative for activity change, appetite change, fatigue, fever and unexpected weight change.  HENT:  Negative for congestion, ear pain, rhinorrhea, sinus pressure and sore throat.   Eyes:  Negative for pain, redness and visual disturbance.  Respiratory:  Negative for cough, shortness of breath and wheezing.   Cardiovascular:  Negative for chest pain and palpitations.  Gastrointestinal:  Negative for abdominal pain, blood in stool, constipation and diarrhea.  Endocrine: Negative for polydipsia and polyuria.  Genitourinary:  Negative for dysuria, frequency and urgency.  Musculoskeletal:  Positive for arthralgias. Negative for back pain and myalgias.  Skin:  Negative for pallor and rash.  Allergic/Immunologic: Negative for environmental allergies.  Neurological:  Negative for dizziness, syncope and headaches.  Hematological:  Negative for adenopathy. Does not bruise/bleed  easily.  Psychiatric/Behavioral:  Negative for decreased concentration and dysphoric mood. The patient is not nervous/anxious.        Objective:   Physical Exam Constitutional:      General: She is not in acute distress.    Appearance: Normal appearance. She is well-developed. She is obese. She is not ill-appearing or diaphoretic.  HENT:     Head: Normocephalic and atraumatic.     Right Ear: Tympanic membrane, ear canal and external ear normal.     Left Ear: Tympanic membrane, ear canal and external ear normal.     Nose: Nose normal. No congestion.     Mouth/Throat:     Mouth: Mucous membranes are moist.     Pharynx: Oropharynx is clear. No posterior oropharyngeal erythema.  Eyes:     General: No scleral icterus.    Extraocular Movements: Extraocular movements intact.     Conjunctiva/sclera: Conjunctivae normal.     Pupils: Pupils are equal, round, and reactive to light.  Neck:     Thyroid: No thyromegaly.     Vascular: No carotid bruit or JVD.  Cardiovascular:     Rate and Rhythm: Normal rate and regular rhythm.     Pulses: Normal pulses.     Heart sounds: Normal heart sounds.     No gallop.  Pulmonary:     Effort: Pulmonary effort is normal. No respiratory distress.     Breath sounds: Normal breath sounds. No wheezing.     Comments: Good air exch Chest:     Chest wall: No tenderness.  Abdominal:     General: Bowel sounds are normal. There is  no distension or abdominal bruit.     Palpations: Abdomen is soft. There is no mass.     Tenderness: There is no abdominal tenderness.     Hernia: No hernia is present.  Genitourinary:    Comments: Breast exam: No mass, nodules, thickening, tenderness, bulging, retraction, inflamation, nipple discharge or skin changes noted.  No axillary or clavicular LA.                  Anus appears normal w/o hemorrhoids or masses       External genitalia : nl appearance and hair distribution/no lesions       Urethral meatus : nl size, no  lesions or prolapse       Urethra: no masses, tenderness or scarring      Bladder : no masses or tenderness       Vagina: nl general appearance, no discharge or  Lesions, no significant cystocele  or rectocele       Cervix: no lesions/ discharge or friability      Uterus: nl size, contour, position, and mobility (not fixed) , non tender      Adnexa : no masses, tenderness, enlargement or nodularity           Musculoskeletal:        General: No tenderness. Normal range of motion.     Cervical back: Normal range of motion and neck supple. No rigidity. No muscular tenderness.     Right lower leg: No edema.     Left lower leg: No edema.     Comments: No kyphosis   Lymphadenopathy:     Cervical: No cervical adenopathy.  Skin:    General: Skin is warm and dry.     Coloration: Skin is not pale.     Findings: No erythema or rash.     Comments: Solar lentigines diffusely   Neurological:     Mental Status: She is alert. Mental status is at baseline.     Cranial Nerves: No cranial nerve deficit.     Motor: No abnormal muscle tone.     Coordination: Coordination normal.     Gait: Gait normal.     Deep Tendon Reflexes: Reflexes are normal and symmetric. Reflexes normal.  Psychiatric:        Mood and Affect: Mood normal.        Cognition and Memory: Cognition and memory normal.           Assessment & Plan:   Problem List Items Addressed This Visit       Cardiovascular and Mediastinum   Benign essential HTN   BP: 124/82  No medicines currently  Managing with lifestyle change    No changes needed Most recent labs reviewed  Disc lifstyle change with low sodium diet and exercise          Endocrine   Insulin  resistance   Lab Results  Component Value Date   HGBA1C 5.6 03/28/2024   HGBA1C 5.4 04/19/2022   HGBA1C 5.3 03/31/2021   disc imp of low glycemic diet and wt loss to prevent DM2         Other   Vitamin D  insufficiency   Last vitamin D  Lab Results   Component Value Date   VD25OH 27.37 (L) 03/28/2024   Vitamin D  level is therapeutic with current supplementation Disc importance of this to bone and overall health Will continue 4000 international units daily       Routine general medical examination at  a health care facility - Primary   Reviewed health habits including diet and exercise and skin cancer prevention Reviewed appropriate screening tests for age  Also reviewed health mt list, fam hx and immunization status , as well as social and family history   See HPI Labs reviewed and ordered Health Maintenance  Topic Date Due   Hepatitis C Screening  Never done   Hepatitis B Vaccine (1 of 3 - 19+ 3-dose series) Never done   Pneumococcal Vaccine for age over 56 (1 of 1 - PCV) Never done   Zoster (Shingles) Vaccine (1 of 2) Never done   Pap with HPV screening  02/02/2023   Breast Cancer Screening  09/08/2023   COVID-19 Vaccine (4 - 2025-26 season) 04/20/2024*   Colon Cancer Screening  07/15/2026   DTaP/Tdap/Td vaccine (3 - Td or Tdap) 08/06/2030   Flu Shot  Completed   HIV Screening  Completed   HPV Vaccine  Aged Out   Meningitis B Vaccine  Aged Out  *Topic was postponed. The date shown is not the original due date.    Flu shot given Pt plans to schedule mammo  Discussed fall prevention, supplements and exercise for bone density  Taking vitamin D  Encouraged follow up with dermatology for skin screening PHQ 5  treated       Other hyperlipidemia   .Disc goals for lipids and reasons to control them Rev last labs with pt Rev low sat fat diet in detail  Diet controlled  High HDL is reassuring LDLof 140       Need for influenza vaccination   Relevant Orders   Flu vaccine trivalent PF, 6mos and older(Flulaval,Afluria,Fluarix,Fluzone) (Completed)   Family history of melanoma   Encouraged pt strongly to go to dermatology for routine screening  Also to use sun protection when outdoors       Encounter for screening  mammogram for breast cancer   Mammo order is in Encouraged pt to call and schedule       Encounter for routine gynecological examination   Routine exam and pap No complaints       Relevant Orders   Cytology - PAP([facility])   Depression   Currently takes bupropion  Also for weight loss  PHQ 4         Body mass index (BMI) 34.0-34.9, adult   Discussed how this problem influences overall health and the risks it imposes  Reviewed plan for weight loss with lower calorie diet (via better food choices (lower glycemic and portion control) along with exercise building up to or more than 30 minutes 5 days per week including some aerobic activity and strength training   See a/p for abnormal appetite Discussed small steps/ cutting out more processed food   Insurance no longer paying for glp-1 or weight clinic       Abnormal food appetite   Causes overeating  Craves sugar Glp-1 worked in [employer] no longer pays for this or healthy weight center   Wellbutrin and naltrexone not as helpful

## 2024-04-04 NOTE — Assessment & Plan Note (Signed)
.  Disc goals for lipids and reasons to control them Rev last labs with pt Rev low sat fat diet in detail  Diet controlled  High HDL is reassuring LDLof 140

## 2024-04-04 NOTE — Patient Instructions (Addendum)
 If you are interested in the shingles vaccine series (Shingrix), call your insurance or pharmacy to check on coverage and location it must be given.  If affordable - you can schedule it here or at your pharmacy depending on coverage   Flu shot today    Try to get most of your carbohydrates from produce (with the exception of white potatoes) and whole grains Eat less bread/pasta/rice/snack foods/cereals/sweets and other items from the middle of the grocery store (processed carbs)  Keep busy to avoid mindless eating   For cholesterol Avoid red meat/ fried foods/ egg yolks/ fatty breakfast meats/ butter, cheese and high fat dairy/ and shellfish     Keep walking  Continue strength training   Get back to your dermatology for skin cancer screening   Pap today      You have an order for:  [x]   3D Mammogram  []   Bone Density     Please call for appointment:   [x]   [facility]  [address] [place] [address]  [phone]  []   [facility])   [address]  [place], [place] [address]  [phone]  []   [facility]      [address] [place], [place]        [phone]         []   [facility]  [address] [place], [place]  [phone]  []  [facility]   [address]   [place], [place] [address]  [phone]  []  [facility]  [address] [place], [place] [address] [phone]    Make sure to wear two piece clothing  No lotions powders or deodorants the day of the appointment Make sure to bring picture ID and insurance card.  Bring list of medications you are currently taking including any supplements.   Schedule your screening mammogram through MyChart!   Select [facility] imaging sites can now be  scheduled through MyChart.  Log into your MyChart account.  Go to 'Visit' (or 'Appointments' if  on mobile App) --> Schedule an  Appointment  Under 'Select a Reason for Visit' choose the Mammogram  Screening option.  Complete the pre-visit questions  and select the time and place that  best fits your schedule

## 2024-04-04 NOTE — Assessment & Plan Note (Signed)
 Last vitamin D  Lab Results  Component Value Date   VD25OH 27.37 (L) 03/28/2024   Vitamin D  level is therapeutic with current supplementation Disc importance of this to bone and overall health Will continue 4000 international units daily

## 2024-04-04 NOTE — Assessment & Plan Note (Signed)
 Mammo order is in Encouraged pt to call and schedule

## 2024-04-04 NOTE — Assessment & Plan Note (Signed)
 Reviewed health habits including diet and exercise and skin cancer prevention Reviewed appropriate screening tests for age  Also reviewed health mt list, fam hx and immunization status , as well as social and family history   See HPI Labs reviewed and ordered Health Maintenance  Topic Date Due   Hepatitis C Screening  Never done   Hepatitis B Vaccine (1 of 3 - 19+ 3-dose series) Never done   Pneumococcal Vaccine for age over 51 (1 of 1 - PCV) Never done   Zoster (Shingles) Vaccine (1 of 2) Never done   Pap with HPV screening  02/02/2023   Breast Cancer Screening  09/08/2023   COVID-19 Vaccine (4 - 2025-26 season) 04/20/2024*   Colon Cancer Screening  07/15/2026   DTaP/Tdap/Td vaccine (3 - Td or Tdap) 08/06/2030   Flu Shot  Completed   HIV Screening  Completed   HPV Vaccine  Aged Out   Meningitis B Vaccine  Aged Out  *Topic was postponed. The date shown is not the original due date.    Flu shot given Pt plans to schedule mammo  Discussed fall prevention, supplements and exercise for bone density  Taking vitamin D  Encouraged follow up with dermatology for skin screening PHQ 5  treated

## 2024-04-04 NOTE — Assessment & Plan Note (Signed)
 Currently takes bupropion  Also for weight loss  PHQ 4

## 2024-04-04 NOTE — Assessment & Plan Note (Addendum)
 Causes overeating  Craves sugar Glp-1 worked in [employer] no longer pays for this or healthy weight center   Wellbutrin and naltrexone not as helpful

## 2024-04-04 NOTE — Assessment & Plan Note (Signed)
 Encouraged pt strongly to go to dermatology for routine screening  Also to use sun protection when outdoors

## 2024-04-06 ENCOUNTER — Ambulatory Visit: Payer: Self-pay | Admitting: Family Medicine

## 2024-04-06 LAB — CYTOLOGY - PAP
Adequacy: ABSENT
Comment: NEGATIVE
Diagnosis: NEGATIVE
High risk HPV: NEGATIVE

## 2024-05-24 ENCOUNTER — Telehealth: Payer: Self-pay | Admitting: Family Medicine

## 2024-05-24 NOTE — Telephone Encounter (Signed)
 Calling from [employer] on behalf of patient inquiring of we bill no network plans members can't be penelized without network  asking if we bill.  His questions seemed vague.  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

## 2024-07-03 ENCOUNTER — Encounter: Payer: Self-pay | Admitting: Family Medicine

## 2024-07-05 NOTE — Telephone Encounter (Signed)
 Due to complexity of that medication, we need to talk before starting that since I have not prescribed for you before - please schedule appointment when I am back in the office  Suspect we can go forward once we make a plan  Thanks

## 2024-07-16 ENCOUNTER — Encounter: Payer: Self-pay | Admitting: Family Medicine

## 2024-07-16 ENCOUNTER — Ambulatory Visit (INDEPENDENT_AMBULATORY_CARE_PROVIDER_SITE_OTHER): Payer: Self-pay | Admitting: Family Medicine

## 2024-07-16 ENCOUNTER — Other Ambulatory Visit: Payer: Self-pay | Admitting: Family Medicine

## 2024-07-16 VITALS — BP 130/86 | HR 74 | Temp 98.2°F | Ht 61.25 in | Wt 186.2 lb

## 2024-07-16 DIAGNOSIS — I10 Essential (primary) hypertension: Secondary | ICD-10-CM

## 2024-07-16 DIAGNOSIS — R638 Other symptoms and signs concerning food and fluid intake: Secondary | ICD-10-CM

## 2024-07-16 DIAGNOSIS — Z6834 Body mass index (BMI) 34.0-34.9, adult: Secondary | ICD-10-CM | POA: Diagnosis not present

## 2024-07-16 DIAGNOSIS — E7849 Other hyperlipidemia: Secondary | ICD-10-CM | POA: Diagnosis not present

## 2024-07-16 DIAGNOSIS — E88819 Insulin resistance, unspecified: Secondary | ICD-10-CM | POA: Diagnosis not present

## 2024-07-16 DIAGNOSIS — K5909 Other constipation: Secondary | ICD-10-CM | POA: Diagnosis not present

## 2024-07-16 MED ORDER — WEGOVY 0.25 MG/0.5ML ~~LOC~~ SOAJ: 0.2500 mg | SUBCUTANEOUS | 0 refills | Status: DC

## 2024-07-16 NOTE — Assessment & Plan Note (Signed)
 Not problematic if she takes a fiber supplement daily with fluids

## 2024-07-16 NOTE — Assessment & Plan Note (Signed)
 bp in fair control at this time  BP Readings from Last 1 Encounters:  07/16/24 130/86   No changes needed- no medications  Most recent labs reviewed  Disc lifstyle change with low sodium diet and exercise   Working on fitness and weight loss

## 2024-07-16 NOTE — Assessment & Plan Note (Addendum)
 BMI of 34.9 Has been to healthy weight and wellness clinic and ins stopped covering it  Reviewed prior records Is currently enrolled in weight watchers-taking metformin  1000 mg daily-not helpful so far with weight loss  Eating low glycemic diet but has a large appetite and craves carbs/especially bread  In past , bupropion, topamax  and naltrexone did not help appetite but did well on glp-1 while she could afford it  Reviewed meds and supplements Reviewed low glycemic diet  Reviewed plan for exercise minimum of 30 min 5 d per week with strength training regularly (has good plan with videos in and outdoors and time to fit it in)   Disc option of GLP medication including possible side effects like GI intolerance and risk of thyroid and endocrine cancer, pancreatitis and gallstones, kidney problems and diabetic retinopathy  Will start semaglutide  0.25 mg weekly  Instructed to call if any side effects   At 3 wk will update and then start to titrate dose up and plan follow up   Call back and Er precautions noted in detail today    I personally spent a total of 32 minutes in the care of the patient today including preparing to see the patient, getting/reviewing separately obtained history, performing a medically appropriate exam/evaluation, counseling and educating, placing orders, and documenting clinical information in the EHR.

## 2024-07-16 NOTE — Telephone Encounter (Signed)
 Please ask if she needs me to send elsewhere? Is she paying out of pocket-if so let pharmacy know   Thanks

## 2024-07-16 NOTE — Patient Instructions (Addendum)
 Make sure you exercise minimum of 30 minutes 5 days per week  The strength training /resistance exercise is most important  Add some strength training to your routine, this is important for bone and brain health and can reduce your risk of falls and help your body use insulin  properly and regulate weight  Light weights, exercise bands , and internet videos are a good way to start  Yoga (chair or regular), machines , floor exercises or a gym with machines are also good options    Start on your first month of wegovy  0.25 mg weekly  Any intolerable side effects-let us  know   Update me before the next refill with how you are doing     Avoid added sugars in your diet when you can  Try to get most of your carbohydrates from produce (with the exception of white potatoes) and whole grains Eat less bread/pasta/rice/snack foods/cereals/sweets and other items from the middle of the grocery store (processed carbs)

## 2024-07-16 NOTE — Assessment & Plan Note (Signed)
.  Disc goals for lipids and reasons to control them Rev last labs with pt Rev low sat fat diet in detail  Diet controlled  High HDL is reassuring LDLof 140  ASCVD risk 2.4% Working on better diet for weight loss

## 2024-07-16 NOTE — Progress Notes (Signed)
 "  Subjective:    Patient ID: Andrea Alvarez, female    DOB: October 01, 1965, 58 y.o.   MRN: [id number]  HPI  Wt Readings from Last 3 Encounters:  07/16/24 186 lb 4 oz (84.5 kg)  04/04/24 183 lb 2 oz (83.1 kg)  02/27/24 179 lb 2 oz (81.3 kg)   34.90 kg/m  Vitals:   07/16/24 0921  BP: 130/86  Pulse: 74  Temp: 98.2 F (36.8 C)  SpO2: 98%    Pt presents for follow up of chronic medical problems Obesity  HTN Prediabetes/insuin resistance Hyperlipidemia    Obesity Pt thinks she has abnormal appetite causing polyphagia  Has lost weight in past with [facility] and glp-1   Has taken glp-1 in past and then ins stopped covering it  She found out that [employer]govy  is running promotion through [employer] in re starting   Did well with semaglutide  in the past  Tolerated it well  In past lost about 20 lb   (had changed to mounjaro  at that point and lost another 30 lb)   Tolerated well in past  Takes fiber daily and it helps with constipation  Peanut butter causes acid reflux symptoms so she avoids it   In past has tried bupropion for depression and it did not affect appetite  Took naltrexone from weight wat[facility] in past  Topamax  in past    Metformin  now 1000 mg once daily  ? XR From [facility] wat[facility]    Supplements Vitamin D  Iron  Over the counter fiber pill   Fluids-mostly tea  Can drink water   Currently  Diet -pretty good overall  Protein- eggs  Half sandwich Healthy dinner    Not much exercise due to working 2 jobs and holidays  Has walking videos and walks outside   Uses exercise bands Also has weights  Floor routine  Sonny Larve    HTN bp is stable today  No cp or palpitations or headaches or edema  No medications-managed by lifestyle    BP Readings from Last 3 Encounters:  07/16/24 130/86  04/04/24 124/82  02/27/24 130/82      Lab Results  Component Value Date   NA 139 03/28/2024   K 4.6 03/28/2024    CO2 31 03/28/2024   GLUCOSE 89 03/28/2024   BUN 16 03/28/2024   CREATININE 0.80 03/28/2024   CALCIUM 9.6 03/28/2024   GFR 81.20 03/28/2024   EGFR 89 04/19/2022   GFRNONAA 91 07/04/2020   History of insulin  resistance Lab Results  Component Value Date   HGBA1C 5.6 03/28/2024   HGBA1C 5.4 04/19/2022   HGBA1C 5.3 03/31/2021    No history of gallstones No history of medullary thyroid cancer or MEN No history of pancreatitis   Lab Results  Component Value Date   WBC 4.8 03/28/2024   HGB 13.4 03/28/2024   HCT 40.7 03/28/2024   MCV 90.6 03/28/2024   PLT 301.0 03/28/2024    Lab Results  Component Value Date   CHOL 230 (H) 03/28/2024   HDL 78.80 03/28/2024   LDLCALC 140 (H) 03/28/2024   TRIG 56.0 03/28/2024   CHOLHDL 3 03/28/2024    The 10-year ASCVD risk score (Arnett DK, et al., 2019) is: 2.4%   Values used to calculate the score:     Age: 45 years     Clinically relevant sex: Female     Is Non-Hispanic African American: No     Diabetic: No  Tobacco smoker: No     Systolic Blood Pressure: 130 mmHg     Is BP treated: No     HDL Cholesterol: 78.8 mg/dL     Total Cholesterol: 230 mg/dL          Patient Active Problem List   Diagnosis Date Noted   Encounter for routine gynecological examination 04/04/2024   Family history of melanoma 04/04/2024   Abnormal food appetite 04/04/2024   Need for influenza vaccination 04/04/2024   Routine general medical examination at a health care facility 04/04/2024   Encounter for screening mammogram for breast cancer 02/27/2024   Finger pain, right 02/27/2024   Chronic constipation 10/15/2020   Chronic back pain 10/15/2020   At risk for heart disease 10/15/2020   Depression 10/15/2020   Benign essential HTN 02/12/2020   Insulin  resistance 08/28/2018   Other hyperlipidemia 08/14/2018   Vitamin D  insufficiency 07/27/2018   Body mass index (BMI) 34.0-34.9, adult 03/25/2016   Acid reflux 05/13/2015   Headache,  migraine 05/13/2015   Past Medical History:  Diagnosis Date   Anemia    Back pain    Constipation    GERD (gastroesophageal reflux disease)    Joint pain    Leg edema    Obesity    SOBOE (shortness of breath on exertion)    Swelling of both lower extremities    Vitamin D  deficiency    Past Surgical History:  Procedure Laterality Date   BREAST SURGERY  02/1999   status post bilateral breast reduction   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   Dr.Madison Clark   REDUCTION MAMMAPLASTY Bilateral 1999   TUBAL LIGATION  1989   Social History[1] Family History  Problem Relation Age of Onset   Hypertension Mother    Cancer Mother    Cancer Father        bladder cancer   Asthma Sister    Heart disease Sister    Coronary artery disease Maternal Grandmother    Coronary artery disease Paternal Grandmother    Heart attack Paternal Grandmother    Asthma Paternal Grandmother    Colon cancer Neg Hx    Colon polyps Neg Hx    Esophageal cancer Neg Hx    Rectal cancer Neg Hx    Stomach cancer Neg Hx    Allergies[2] Medications Ordered Prior to Encounter[3]  Review of Systems  Constitutional:  Negative for activity change, appetite change, fatigue, fever and unexpected weight change.  HENT:  Negative for congestion, ear pain, rhinorrhea, sinus pressure and sore throat.   Eyes:  Negative for pain, redness and visual disturbance.  Respiratory:  Negative for cough, shortness of breath and wheezing.   Cardiovascular:  Negative for chest pain and palpitations.  Gastrointestinal:  Negative for abdominal pain, blood in stool, constipation and diarrhea.       No constipation if she takes her fiber supplement daily  Endocrine: Negative for polydipsia and polyuria.  Genitourinary:  Negative for dysuria, frequency and urgency.  Musculoskeletal:  Negative for arthralgias, back pain and myalgias.  Skin:  Negative for pallor and rash.  Allergic/Immunologic: Negative for environmental  allergies.  Neurological:  Negative for dizziness, syncope and headaches.  Hematological:  Negative for adenopathy. Does not bruise/bleed easily.  Psychiatric/Behavioral:  Negative for decreased concentration and dysphoric mood. The patient is not nervous/anxious.        Objective:   Physical Exam Constitutional:      General: She is not in acute distress.    Appearance:  Normal appearance. She is well-developed. She is obese. She is not ill-appearing or diaphoretic.  HENT:     Head: Normocephalic and atraumatic.  Eyes:     Conjunctiva/sclera: Conjunctivae normal.     Pupils: Pupils are equal, round, and reactive to light.  Neck:     Thyroid: No thyromegaly.     Vascular: No carotid bruit or JVD.  Cardiovascular:     Rate and Rhythm: Normal rate and regular rhythm.     Heart sounds: Normal heart sounds.     No gallop.  Pulmonary:     Effort: Pulmonary effort is normal. No respiratory distress.     Breath sounds: Normal breath sounds. No wheezing or rales.  Abdominal:     General: There is no distension or abdominal bruit.     Palpations: Abdomen is soft.  Musculoskeletal:     Cervical back: Normal range of motion and neck supple.     Right lower leg: No edema.     Left lower leg: No edema.  Lymphadenopathy:     Cervical: No cervical adenopathy.  Skin:    General: Skin is warm and dry.     Coloration: Skin is not pale.     Findings: No rash.  Neurological:     Mental Status: She is alert.     Coordination: Coordination normal.     Deep Tendon Reflexes: Reflexes are normal and symmetric. Reflexes normal.  Psychiatric:        Mood and Affect: Mood normal.           Assessment & Plan:   Problem List Items Addressed This Visit       Cardiovascular and Mediastinum   Benign essential HTN   bp in fair control at this time  BP Readings from Last 1 Encounters:  07/16/24 130/86   No changes needed- no medications  Most recent labs reviewed  Disc lifstyle change  with low sodium diet and exercise   Working on fitness and weight loss          Digestive   Chronic constipation   Not problematic if she takes a fiber supplement daily with fluids         Endocrine   Insulin  resistance   Lab Results  Component Value Date   HGBA1C 5.6 03/28/2024   HGBA1C 5.4 04/19/2022   HGBA1C 5.3 03/31/2021   On metformin  1000 mg daily (? XR-pt not sure) from [facility]   Planning to start semaglutide  low dose for weight and this should lower glucose further         Other   Other hyperlipidemia   .Disc goals for lipids and reasons to control them Rev last labs with pt Rev low sat fat diet in detail  Diet controlled  High HDL is reassuring LDLof 140  ASCVD risk 2.4% Working on better diet for weight loss       Body mass index (BMI) 34.0-34.9, adult - Primary   BMI of 34.9 [facility] been [facility] and ins stopped covering it  Reviewed prior records Is currently enrolled in [facility]-taking metformin  1000 mg daily-not helpful so far with weight loss  Eating low glycemic diet but has a large appetite and craves carbs/especially bread  In past , bupropion, topamax  and naltrexone did not help appetite but did well on glp-1 while she could afford it  Reviewed meds and supplements Reviewed low glycemic diet  Reviewed plan for exercise minimum of 30 min  5 d per week with strength training regularly (has good plan with videos in and outdoors and time to fit it in)   Disc option of GLP medication including possible side effects like GI intolerance and risk of thyroid and endocrine cancer, pancreatitis and gallstones, kidney problems and diabetic retinopathy  Will start semaglutide  0.25 mg weekly  Instructed to call if any side effects   At 3 wk will update and then start to titrate dose up and plan follow up   Call back and Er precautions noted in detail today    I personally spent a total of 32 minutes in the care  of the patient today including preparing to see the patient, getting/reviewing separately obtained history, performing a medically appropriate exam/evaluation, counseling and educating, placing orders, and documenting clinical information in the EHR.       Abnormal food appetite   See a/p for obesity  Will start on semaglutide  0.25 mg weekly /helped in past            [1]  Social History Tobacco Use   Smoking status: Never   Smokeless tobacco: Never  Vaping Use   Vaping status: Never Used  Substance Use Topics   Alcohol use: Yes    Comment: Occasional alcohol use; once or twice a year   Drug use: No  [2] No Known Allergies [3]  Current Outpatient Medications on File Prior to Visit  Medication Sig Dispense Refill   cholecalciferol (VITAMIN D3) 25 MCG (1000 UNIT) tablet Take 4,000 Units by mouth daily.     metFORMIN  (GLUCOPHAGE ) 1000 MG tablet Take 1,000 mg by mouth daily.     No current facility-administered medications on file prior to visit.   "

## 2024-07-16 NOTE — Assessment & Plan Note (Signed)
 Lab Results  Component Value Date   HGBA1C 5.6 03/28/2024   HGBA1C 5.4 04/19/2022   HGBA1C 5.3 03/31/2021   On metformin  1000 mg daily (? XR-pt not sure) from [facility]   Planning to start semaglutide  low dose for weight and this should lower glucose further

## 2024-07-16 NOTE — Assessment & Plan Note (Signed)
 See a/p for obesity  Will start on semaglutide  0.25 mg weekly /helped in past

## 2024-07-16 NOTE — Telephone Encounter (Signed)
 See note on Rx from pharmacy

## 2024-07-18 NOTE — Telephone Encounter (Signed)
 Pt is paying out of pocket, [facility] has a $199 deal (no insurance needed)

## 2024-08-08 ENCOUNTER — Other Ambulatory Visit: Payer: Self-pay | Admitting: Family Medicine

## 2024-08-09 NOTE — Telephone Encounter (Signed)
 How are you doing with the medicine? Do you want to go up to the next dose ?

## 2024-08-09 NOTE — Telephone Encounter (Signed)
 Last filled on 07/16/24 #2 mL/ 0 refills   CPE scheduled 04/05/25

## 2024-08-10 NOTE — Telephone Encounter (Signed)
 Patient is doing good she is down 4 lbs. She would like to go up if ok.

## 2025-03-29 ENCOUNTER — Other Ambulatory Visit

## 2025-04-05 ENCOUNTER — Encounter: Admitting: Family Medicine
